# Patient Record
Sex: Female | Born: 1970 | Race: White | Hispanic: No | Marital: Married | Smoking: Former smoker
Health system: Southern US, Community
[De-identification: ages and names within clinical notes are randomized; demographics above are authoritative.]

## PROBLEM LIST (undated history)

## (undated) DIAGNOSIS — Z923 Personal history of irradiation: Secondary | ICD-10-CM

## (undated) DIAGNOSIS — Z8049 Family history of malignant neoplasm of other genital organs: Secondary | ICD-10-CM

## (undated) DIAGNOSIS — N950 Postmenopausal bleeding: Secondary | ICD-10-CM

## (undated) DIAGNOSIS — E785 Hyperlipidemia, unspecified: Secondary | ICD-10-CM

## (undated) DIAGNOSIS — C50919 Malignant neoplasm of unspecified site of unspecified female breast: Secondary | ICD-10-CM

## (undated) DIAGNOSIS — G43909 Migraine, unspecified, not intractable, without status migrainosus: Secondary | ICD-10-CM

## (undated) DIAGNOSIS — Z9889 Other specified postprocedural states: Secondary | ICD-10-CM

## (undated) DIAGNOSIS — Z973 Presence of spectacles and contact lenses: Secondary | ICD-10-CM

## (undated) DIAGNOSIS — R42 Dizziness and giddiness: Secondary | ICD-10-CM

## (undated) DIAGNOSIS — T8859XA Other complications of anesthesia, initial encounter: Secondary | ICD-10-CM

## (undated) HISTORY — DX: Family history of malignant neoplasm of other genital organs: Z80.49

## (undated) HISTORY — DX: Migraine, unspecified, not intractable, without status migrainosus: G43.909

---
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
## 1998-05-20 ENCOUNTER — Other Ambulatory Visit: Admission: RE | Admit: 1998-05-20 | Discharge: 1998-05-20 | Payer: Self-pay | Admitting: Obstetrics & Gynecology

## 1999-01-23 ENCOUNTER — Inpatient Hospital Stay (HOSPITAL_COMMUNITY): Admission: AD | Admit: 1999-01-23 | Discharge: 1999-01-23 | Payer: Self-pay | Admitting: Obstetrics & Gynecology

## 1999-01-25 ENCOUNTER — Inpatient Hospital Stay (HOSPITAL_COMMUNITY): Admission: AD | Admit: 1999-01-25 | Discharge: 1999-01-25 | Payer: Self-pay | Admitting: Obstetrics & Gynecology

## 1999-02-13 ENCOUNTER — Inpatient Hospital Stay (HOSPITAL_COMMUNITY): Admission: AD | Admit: 1999-02-13 | Discharge: 1999-02-14 | Payer: Self-pay | Admitting: Obstetrics & Gynecology

## 1999-12-28 ENCOUNTER — Other Ambulatory Visit: Admission: RE | Admit: 1999-12-28 | Discharge: 1999-12-28 | Payer: Self-pay | Admitting: Obstetrics & Gynecology

## 2002-02-14 ENCOUNTER — Other Ambulatory Visit: Admission: RE | Admit: 2002-02-14 | Discharge: 2002-02-14 | Payer: Self-pay | Admitting: Obstetrics & Gynecology

## 2003-03-13 ENCOUNTER — Other Ambulatory Visit: Admission: RE | Admit: 2003-03-13 | Discharge: 2003-03-13 | Payer: Self-pay | Admitting: Obstetrics & Gynecology

## 2004-04-05 ENCOUNTER — Other Ambulatory Visit: Admission: RE | Admit: 2004-04-05 | Discharge: 2004-04-05 | Payer: Self-pay | Admitting: Obstetrics & Gynecology

## 2005-04-06 ENCOUNTER — Other Ambulatory Visit: Admission: RE | Admit: 2005-04-06 | Discharge: 2005-04-06 | Payer: Self-pay | Admitting: Obstetrics & Gynecology

## 2011-06-21 HISTORY — PX: CHOLECYSTECTOMY, LAPAROSCOPIC: SHX56

## 2013-06-20 HISTORY — PX: INCISIONAL HERNIA REPAIR: SHX193

## 2015-03-26 DIAGNOSIS — R51 Headache: Secondary | ICD-10-CM

## 2015-03-26 DIAGNOSIS — R519 Headache, unspecified: Secondary | ICD-10-CM | POA: Insufficient documentation

## 2015-03-26 DIAGNOSIS — K219 Gastro-esophageal reflux disease without esophagitis: Secondary | ICD-10-CM | POA: Insufficient documentation

## 2015-03-26 DIAGNOSIS — J309 Allergic rhinitis, unspecified: Secondary | ICD-10-CM | POA: Insufficient documentation

## 2018-11-19 DIAGNOSIS — Z170 Estrogen receptor positive status [ER+]: Secondary | ICD-10-CM

## 2018-11-19 DIAGNOSIS — C50211 Malignant neoplasm of upper-inner quadrant of right female breast: Secondary | ICD-10-CM

## 2018-11-19 HISTORY — DX: Estrogen receptor positive status (ER+): Z17.0

## 2018-11-19 HISTORY — PX: BREAST BIOPSY: SHX20

## 2018-11-19 HISTORY — DX: Malignant neoplasm of upper-inner quadrant of right female breast: C50.211

## 2018-12-05 ENCOUNTER — Other Ambulatory Visit: Payer: Self-pay | Admitting: Obstetrics & Gynecology

## 2018-12-05 DIAGNOSIS — R928 Other abnormal and inconclusive findings on diagnostic imaging of breast: Secondary | ICD-10-CM

## 2018-12-06 ENCOUNTER — Ambulatory Visit
Admission: RE | Admit: 2018-12-06 | Discharge: 2018-12-06 | Disposition: A | Discharge status: Home / Self Care | Payer: BC Managed Care – PPO | Source: Ambulatory Visit | Attending: Obstetrics & Gynecology | Admitting: Obstetrics & Gynecology

## 2018-12-06 ENCOUNTER — Other Ambulatory Visit: Payer: Self-pay | Admitting: Obstetrics & Gynecology

## 2018-12-06 ENCOUNTER — Other Ambulatory Visit: Payer: Self-pay

## 2018-12-06 DIAGNOSIS — N644 Mastodynia: Secondary | ICD-10-CM

## 2018-12-06 DIAGNOSIS — R928 Other abnormal and inconclusive findings on diagnostic imaging of breast: Secondary | ICD-10-CM

## 2018-12-06 DIAGNOSIS — N6310 Unspecified lump in the right breast, unspecified quadrant: Secondary | ICD-10-CM

## 2018-12-06 IMAGING — US ULTRASOUND RIGHT BREAST LIMITED
1 series · 13 of 13 positions shown · non-contrast
Comparison: Previous exam(s).

CLINICAL DATA: Patient was called back from screening mammogram for
possible right breast distortion.

EXAM:
DIGITAL DIAGNOSTIC RIGHT MAMMOGRAM WITH CAD AND TOMO
ULTRASOUND RIGHT BREAST

[Series 1: ultrasound right breast limited · 0.06mm/px · 13 of 13 slices shown]
[im 1/13]
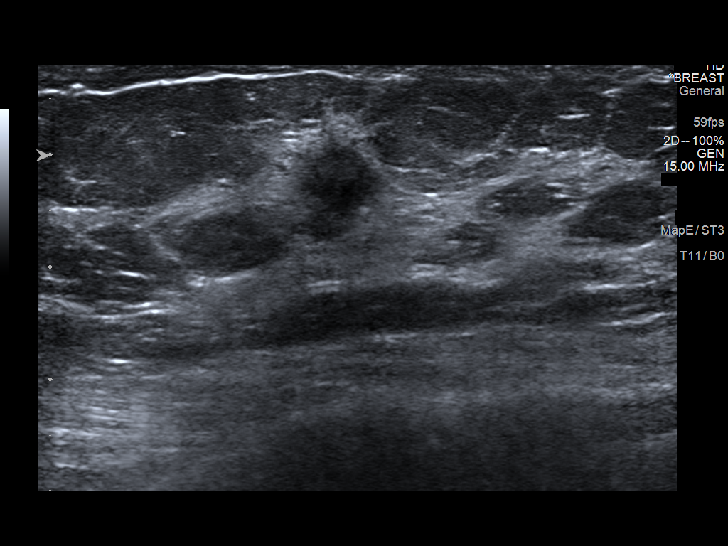
[im 2/13]
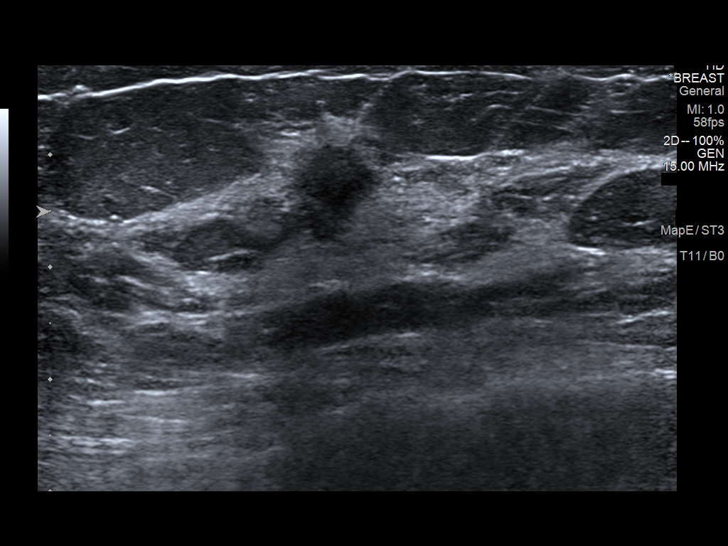
[im 3/13]
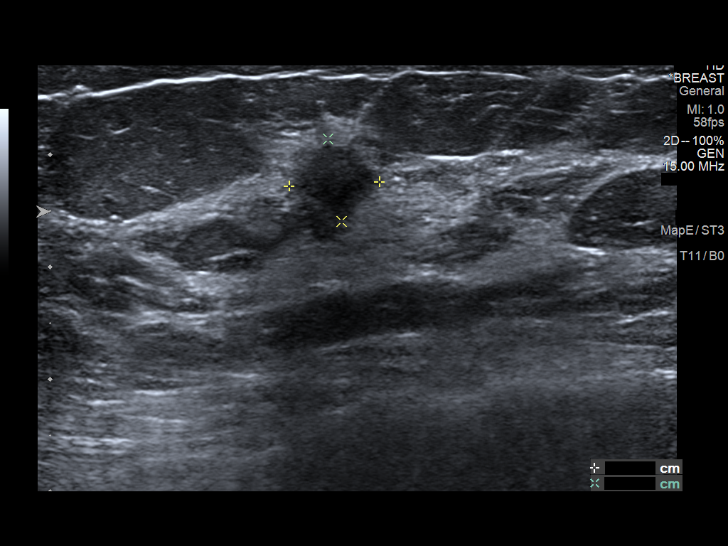
[im 4/13]
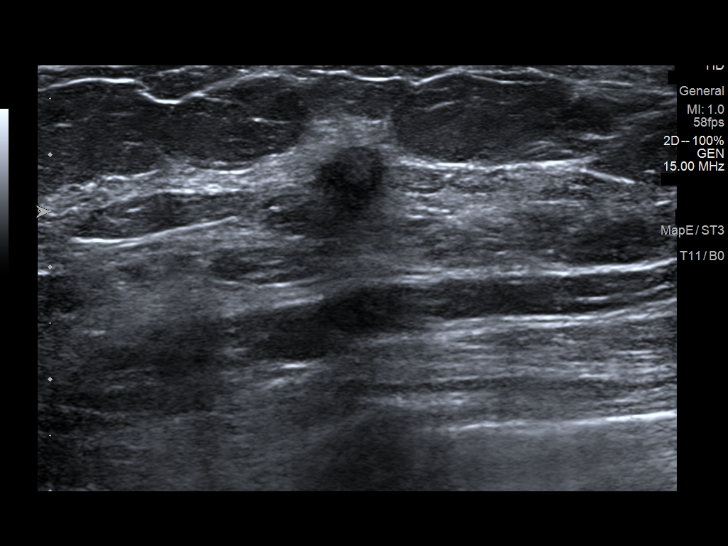
[im 5/13]
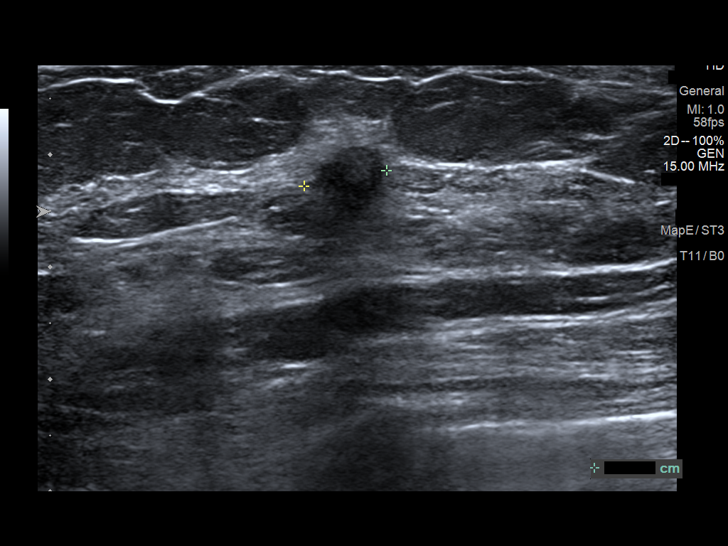
[im 6/13]
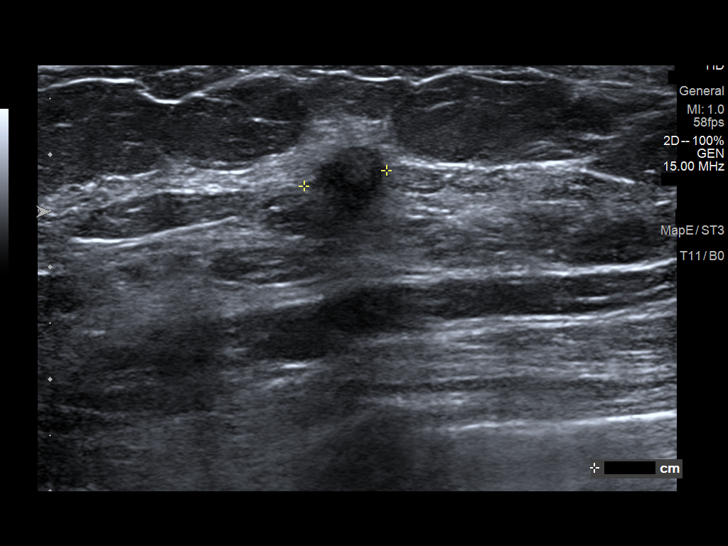
[im 7/13]
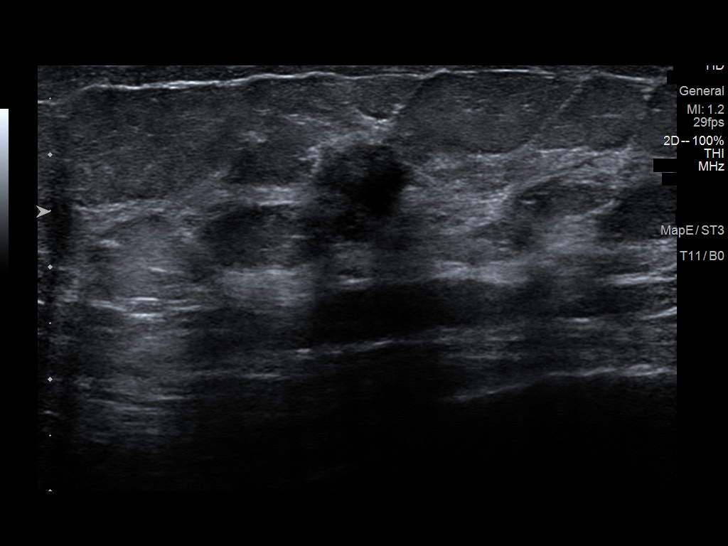
[im 8/13]
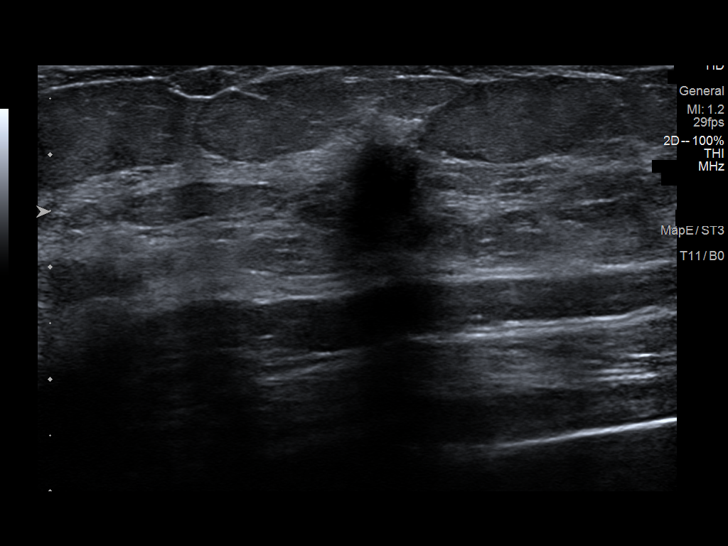
[im 9/13]
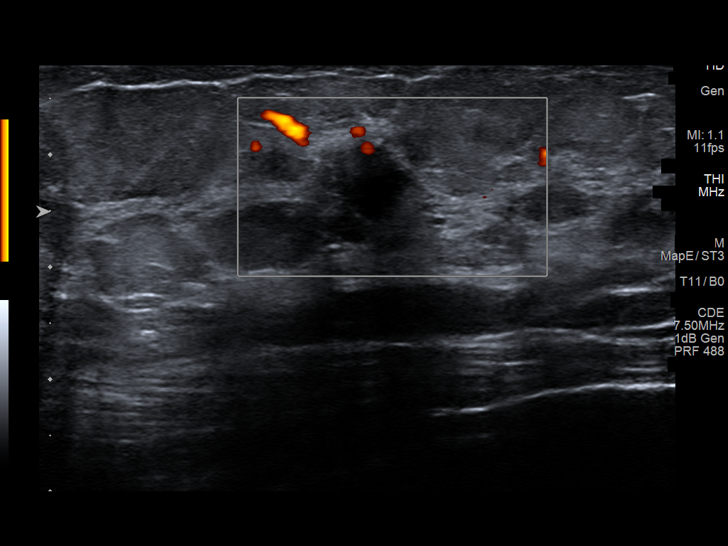
[im 10/13]
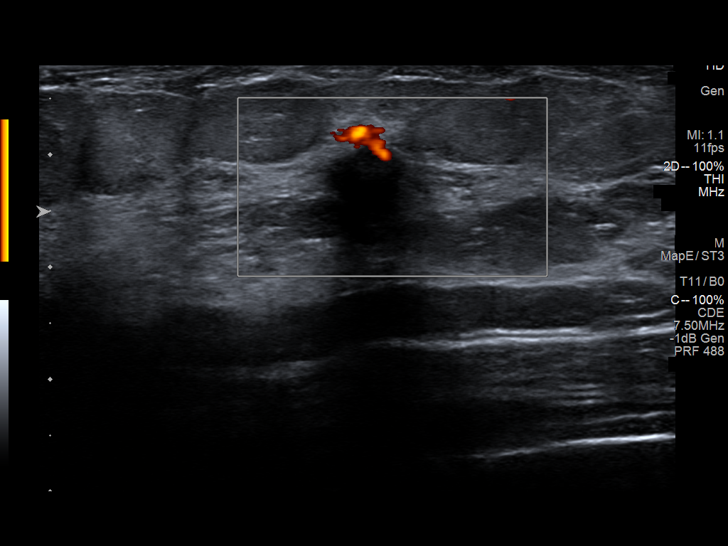
[im 11/13]
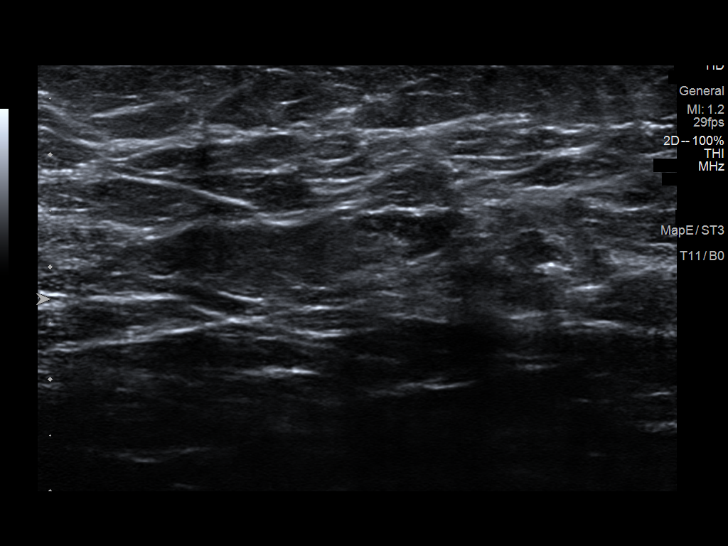
[im 12/13]
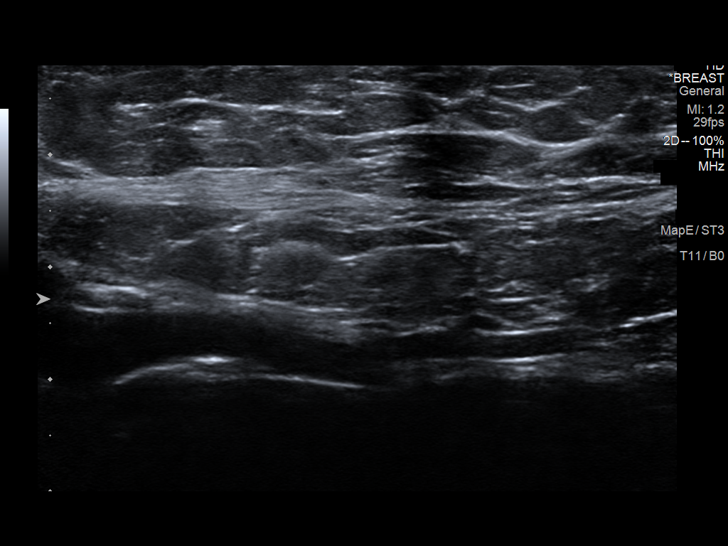
[im 13/13]
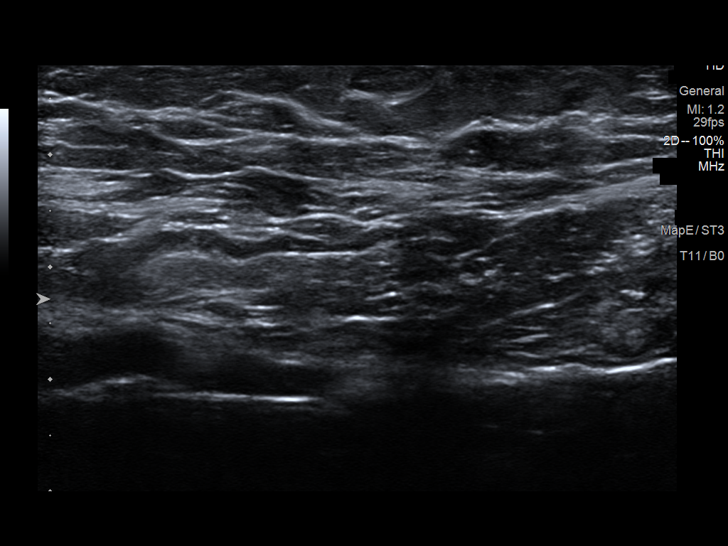

[13 of 13 positions shown; findings below may reference images not displayed]

ACR Breast Density Category c: The breast tissue is heterogeneously
dense, which may obscure small masses.
FINDINGS: Additional imaging of the right breast was performed. There is
persistent distortion in the upper-inner quadrant of the breast.
There are no malignant type microcalcifications.

Mammographic images were processed with CAD.

On physical exam, no mass is palpated in the upper-inner quadrant of
the right breast.

Targeted ultrasound is performed, showing an irregular hypoechoic
mass in the right breast at 1 o'clock 2 cm from the nipple measuring
8 x 7 x 8 mm. Sonographic evaluation of the right axilla does not
show any enlarged adenopathy.
IMPRESSION: Suspicious mass and distortion in the 1 o'clock region of the right
breast.

RECOMMENDATION:
Ultrasound-guided core biopsy of the 1 o'clock region of the right
breast is recommended. The biopsy will be scheduled the patient's
convenience.

I have discussed the findings and recommendations with the patient.
Results were also provided in writing at the conclusion of the
visit. If applicable, a reminder letter will be sent to the patient
regarding the next appointment.

BI-RADS CATEGORY  4: Suspicious.

## 2018-12-06 IMAGING — MG DIGITAL DIAGNOSTIC UNILATERAL RIGHT MAMMOGRAM WITH TOMO AND CAD
6 series · 6 of 18 positions shown · non-contrast
Comparison: Previous exam(s).

CLINICAL DATA: Patient was called back from screening mammogram for
possible right breast distortion.

EXAM:
DIGITAL DIAGNOSTIC RIGHT MAMMOGRAM WITH CAD AND TOMO
ULTRASOUND RIGHT BREAST

[R ML synth-2D]
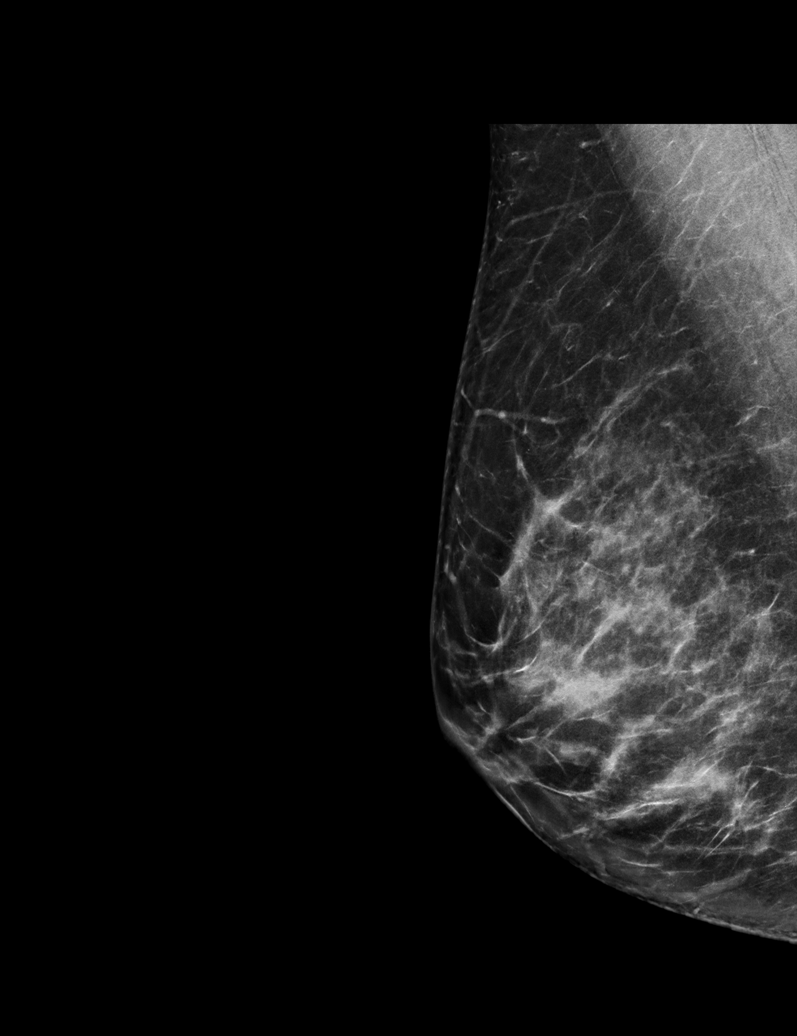

[R MLO synth-2D]
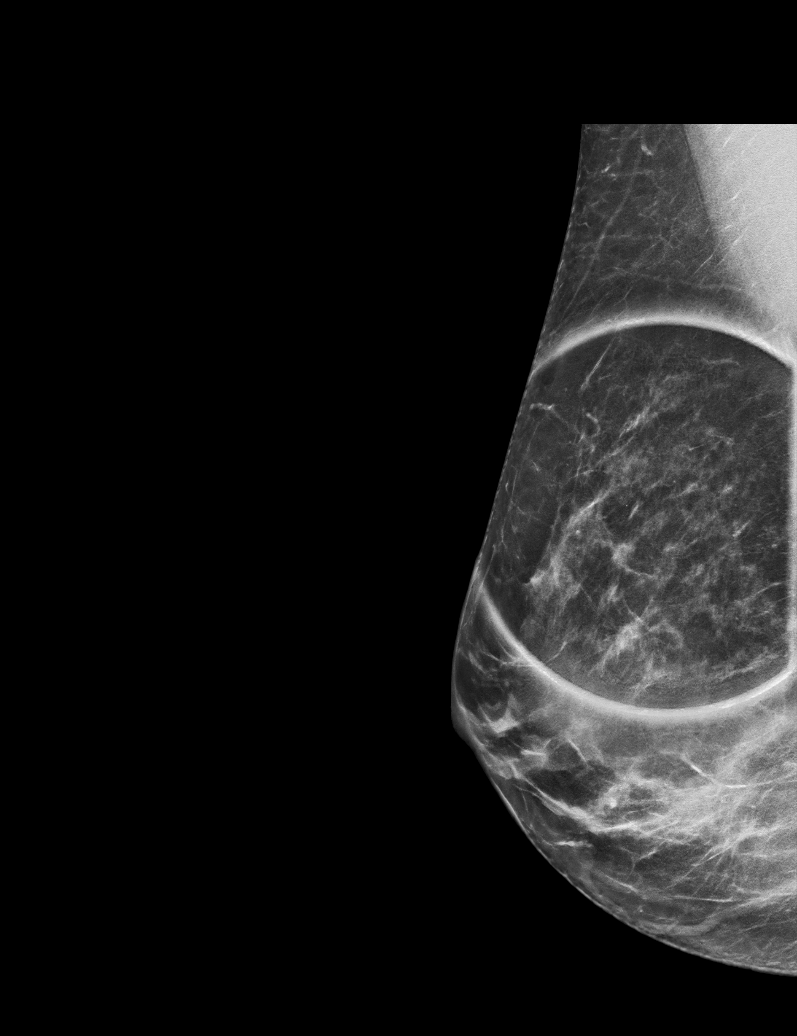

[R CC synth-2D]
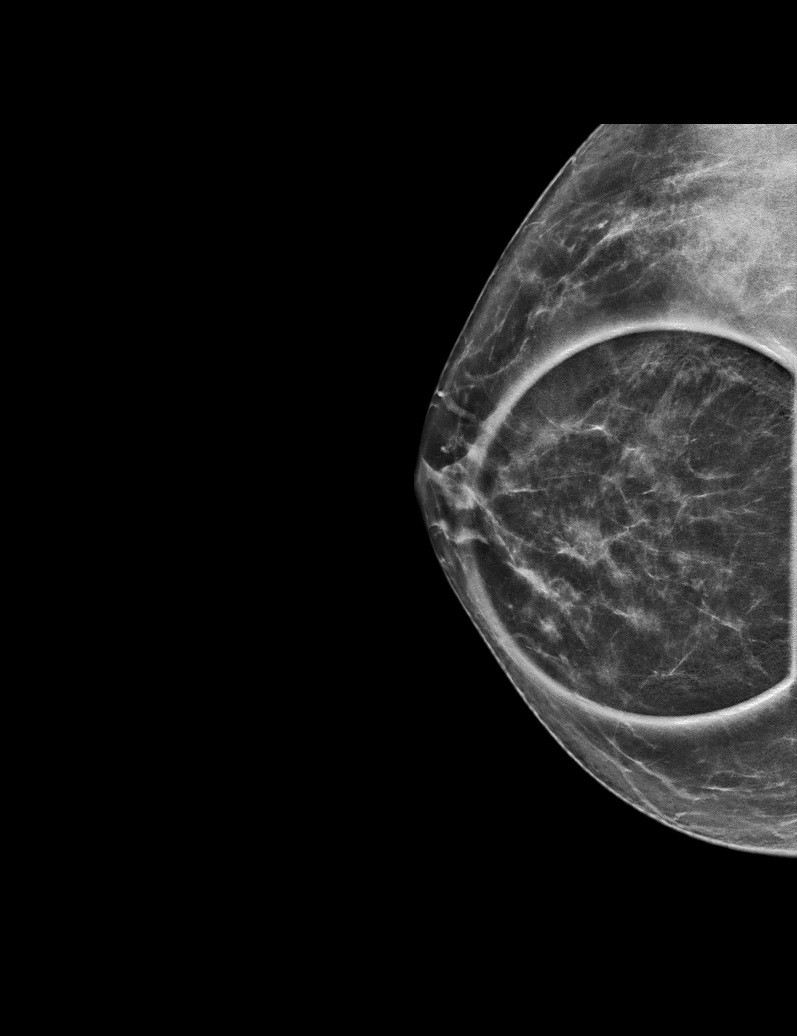

[R CC tomo · tomo slice 35/68.0]
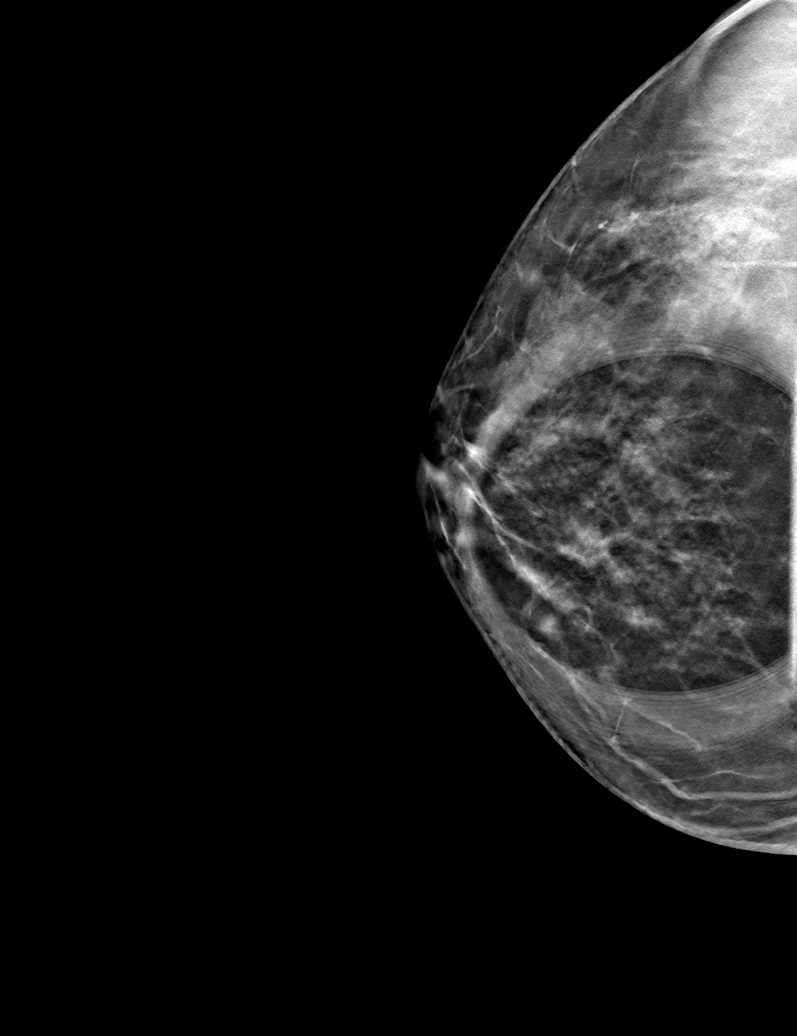

[R MLO tomo · tomo slice 32/63.0]
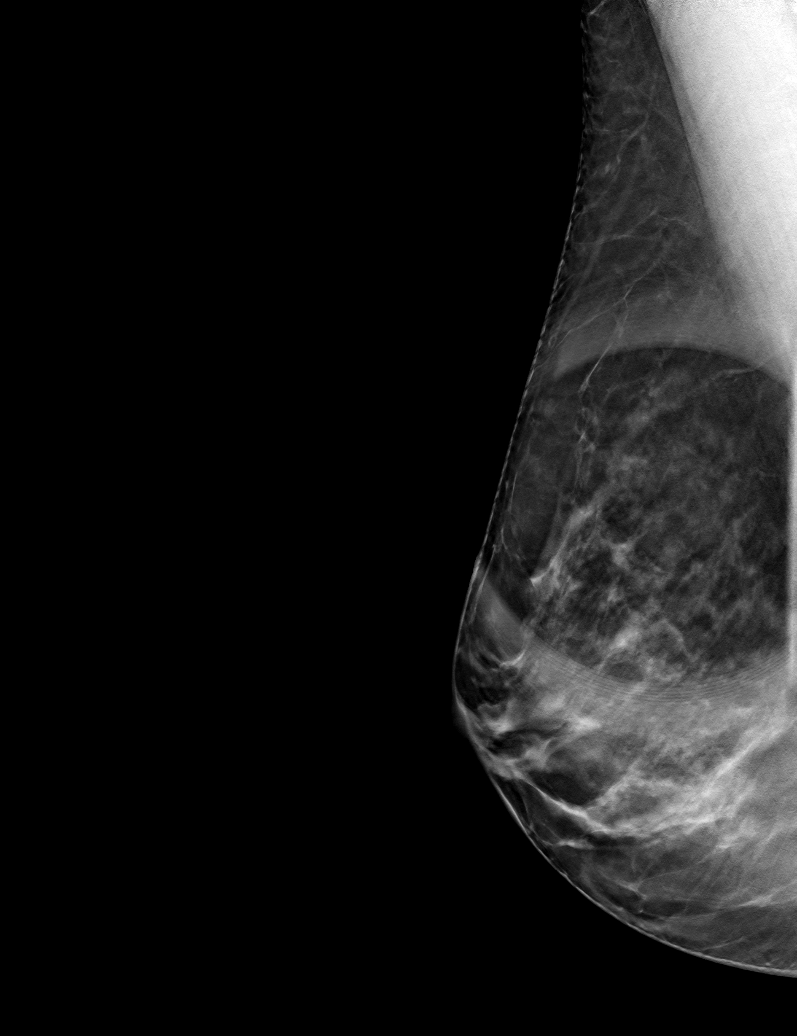

[R ML tomo · tomo slice 37/72.0]
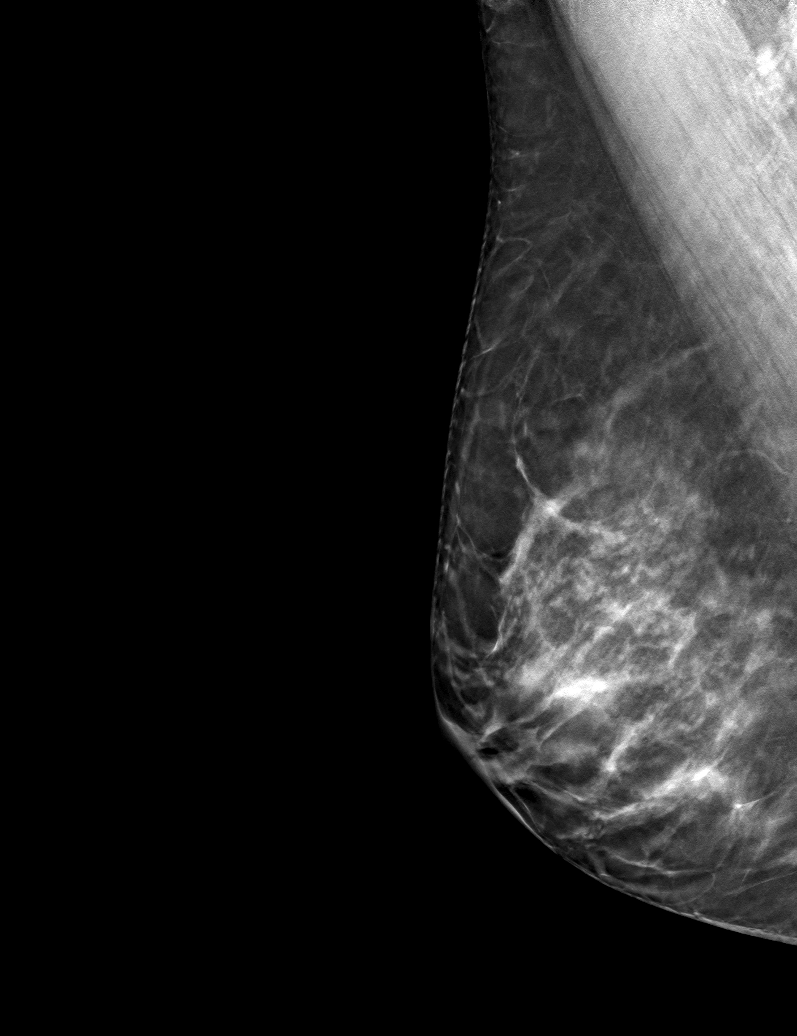

[6 of 18 positions shown; findings below may reference images not displayed]

ACR Breast Density Category c: The breast tissue is heterogeneously
dense, which may obscure small masses.
FINDINGS: Additional imaging of the right breast was performed. There is
persistent distortion in the upper-inner quadrant of the breast.
There are no malignant type microcalcifications.

Mammographic images were processed with CAD.

On physical exam, no mass is palpated in the upper-inner quadrant of
the right breast.

Targeted ultrasound is performed, showing an irregular hypoechoic
mass in the right breast at 1 o'clock 2 cm from the nipple measuring
8 x 7 x 8 mm. Sonographic evaluation of the right axilla does not
show any enlarged adenopathy.
IMPRESSION: Suspicious mass and distortion in the 1 o'clock region of the right
breast.

RECOMMENDATION:
Ultrasound-guided core biopsy of the 1 o'clock region of the right
breast is recommended. The biopsy will be scheduled the patient's
convenience.

I have discussed the findings and recommendations with the patient.
Results were also provided in writing at the conclusion of the
visit. If applicable, a reminder letter will be sent to the patient
regarding the next appointment.

BI-RADS CATEGORY  4: Suspicious.

## 2018-12-10 ENCOUNTER — Ambulatory Visit
Admission: RE | Admit: 2018-12-10 | Discharge: 2018-12-10 | Disposition: A | Discharge status: Home / Self Care | Payer: BC Managed Care – PPO | Source: Ambulatory Visit | Attending: Obstetrics & Gynecology | Admitting: Obstetrics & Gynecology

## 2018-12-10 ENCOUNTER — Other Ambulatory Visit: Payer: Self-pay | Admitting: Obstetrics & Gynecology

## 2018-12-10 ENCOUNTER — Other Ambulatory Visit: Payer: Self-pay

## 2018-12-10 DIAGNOSIS — N6310 Unspecified lump in the right breast, unspecified quadrant: Secondary | ICD-10-CM

## 2018-12-10 IMAGING — MG MM CLIP PLACEMENT
4 series · 4 of 12 positions shown · non-contrast
Comparison: Previous exam(s).

CLINICAL DATA: Status post ultrasound-guided core needle biopsy of
a right breast mass.

EXAM:
DIAGNOSTIC RIGHT MAMMOGRAM POST ULTRASOUND BIOPSY

[R ML synth-2D]
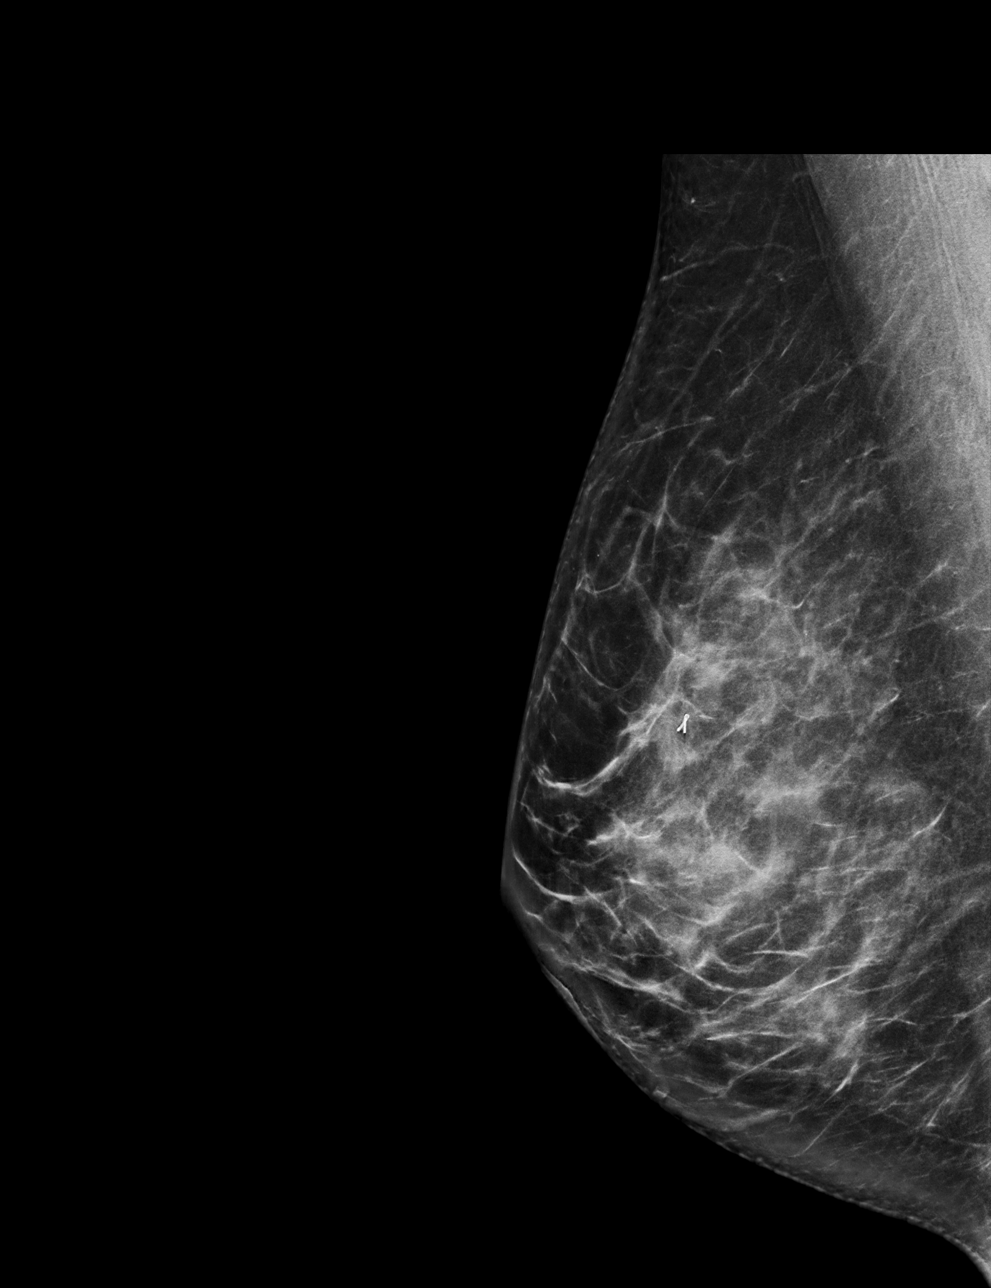

[R CC synth-2D]
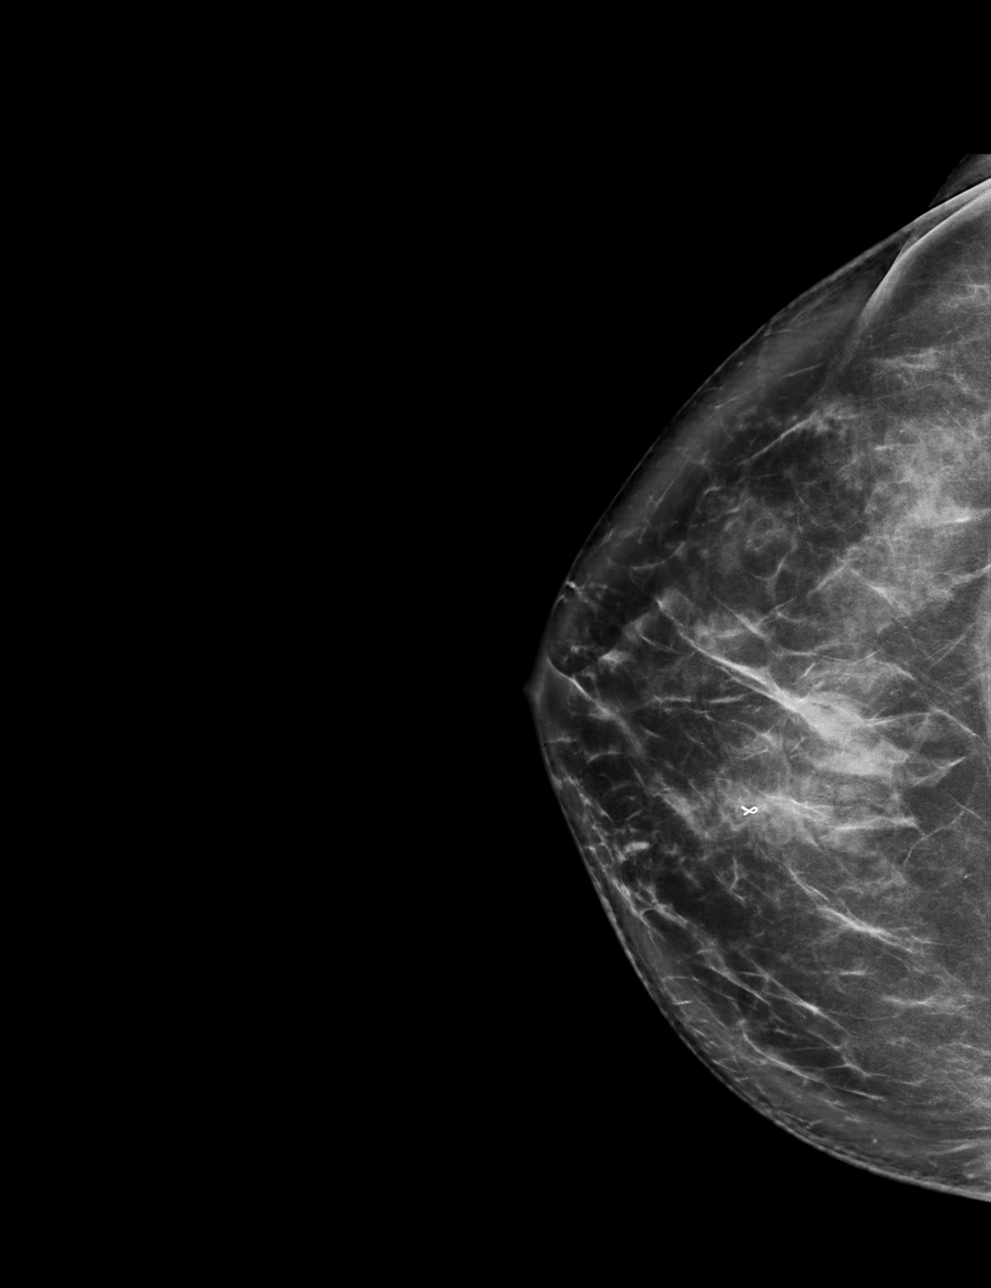

[R ML tomo · tomo slice 41/81.0]
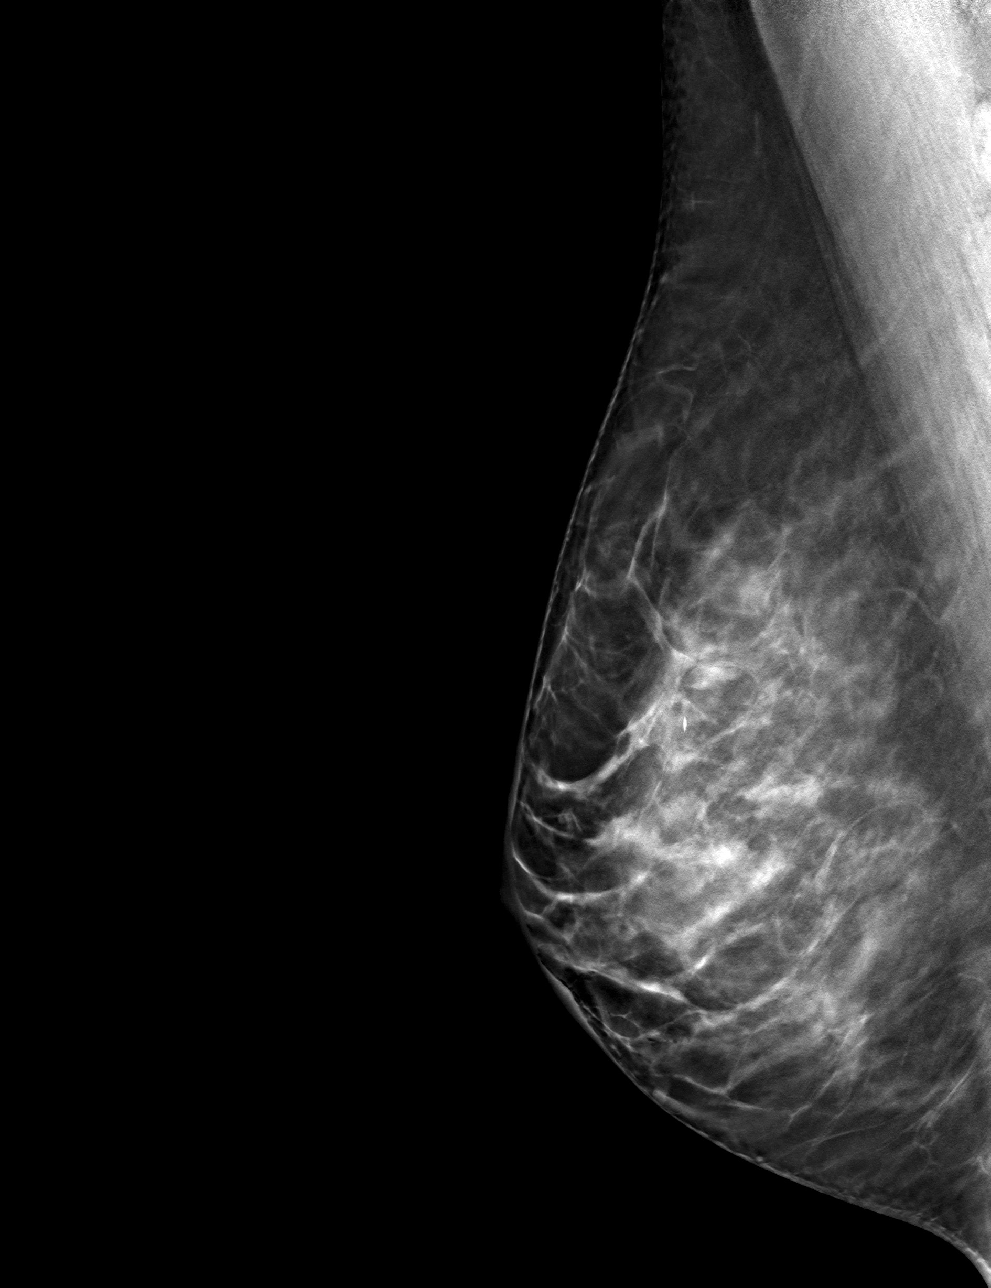

[R CC tomo · tomo slice 45/90.0]
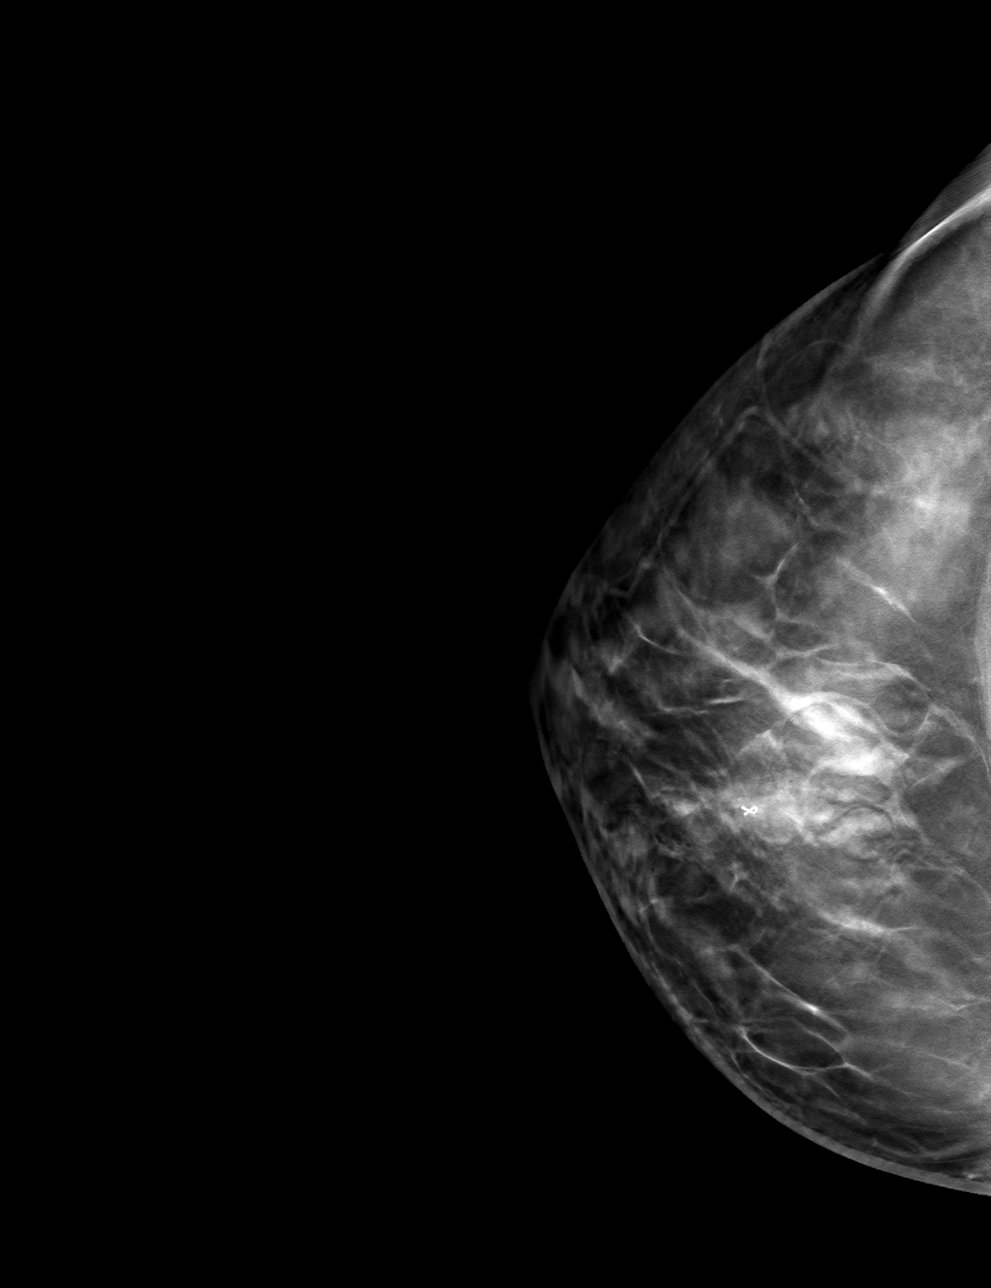

[4 of 12 positions shown; findings below may reference images not displayed]

FINDINGS: Mammographic images were obtained following ultrasound guided biopsy
of a right breast mass. The ribbon shaped biopsy clip lies within
the mass.
IMPRESSION: Well-positioned ribbon shaped biopsy clip following
ultrasound-guided core needle biopsy of the right breast.

Final Assessment: Post Procedure Mammograms for Marker Placement

## 2018-12-10 IMAGING — US US BREAST BX W LOC DEV 1ST LESION IMG BX SPEC US GUIDE*R*
1 series · 11 of 11 positions shown · non-contrast
Comparison: Previous exam(s).
COMPARISON: Previous exam(s).

Addendum:
CLINICAL DATA: Patient presents for ultrasound-guided core needle
biopsy of an 8 mm mass in the right breast 1 o'clock, 2 cm from the
nipple.

EXAM:
ULTRASOUND GUIDED RIGHT BREAST CORE NEEDLE BIOPSY

[Series 1: us breast bx w loc dev 1st lesion img bx spec us g · 0.06mm/px · 11 of 11 slices shown]
[im 1/11]
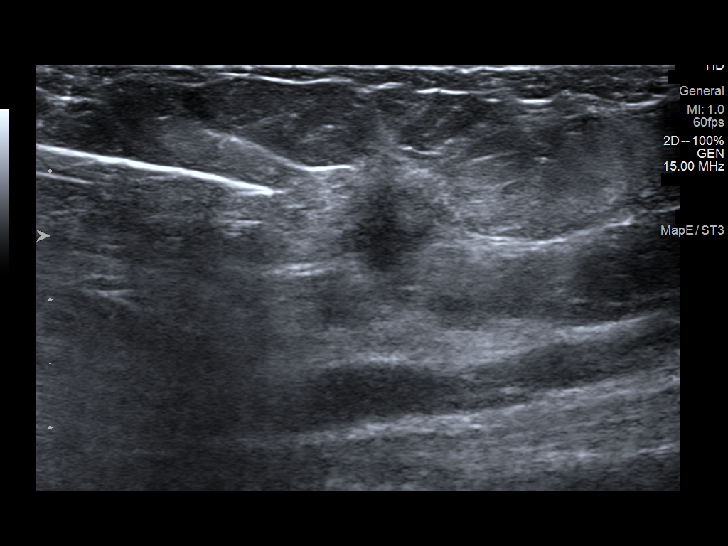
[im 2/11]
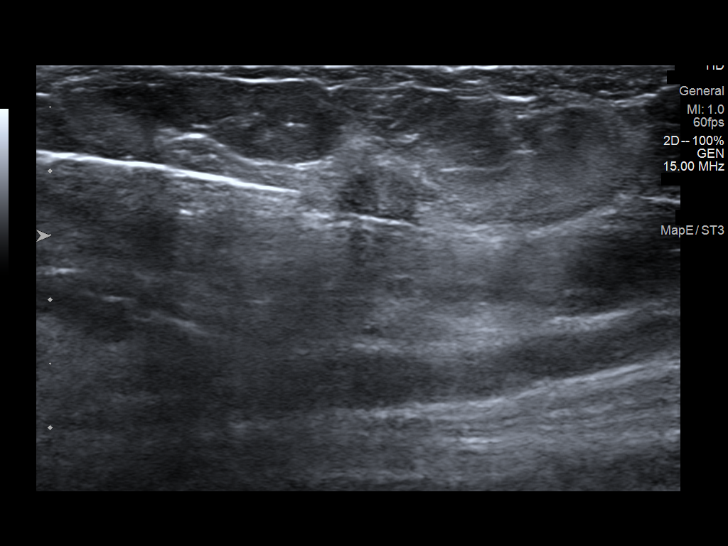
[im 3/11]
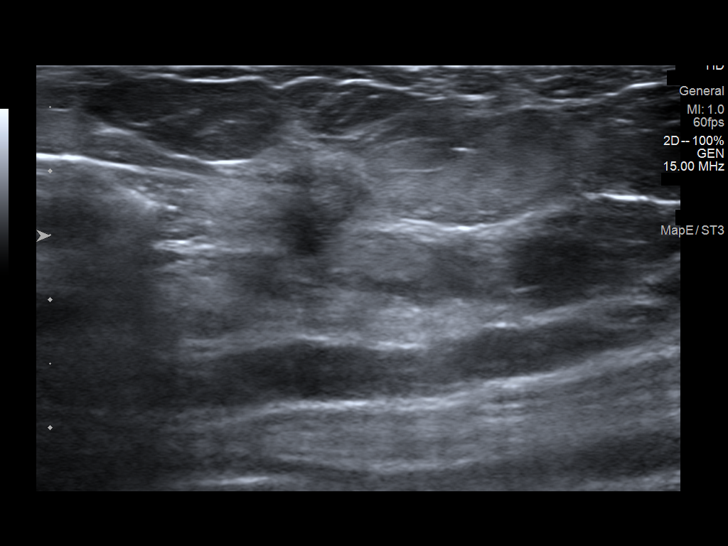
[im 4/11]
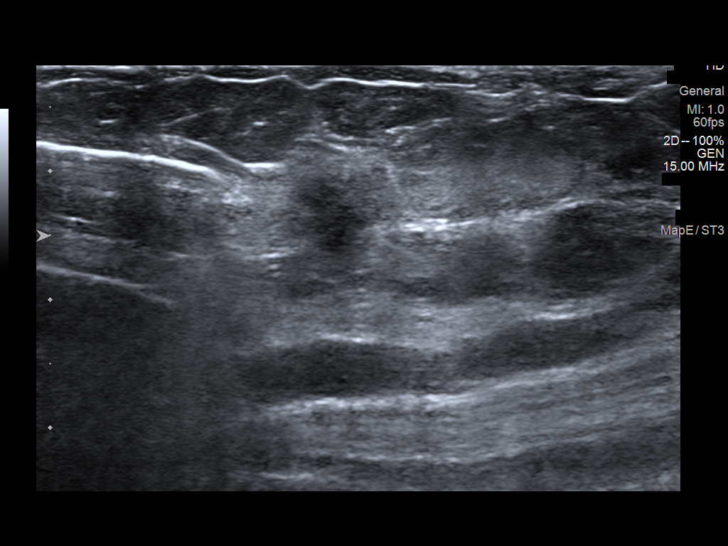
[im 5/11]
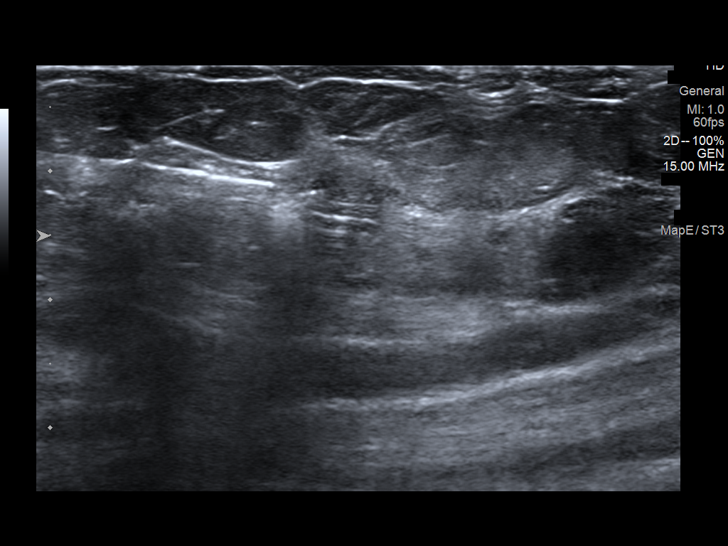
[im 6/11]
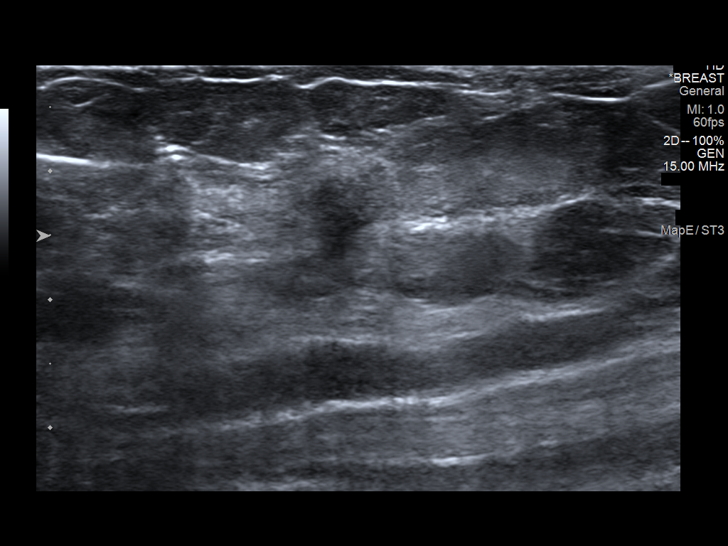
[im 7/11]
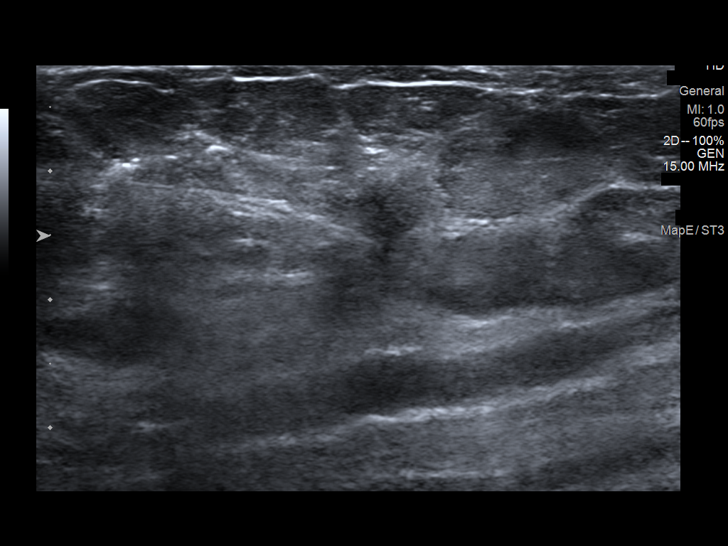
[im 8/11]
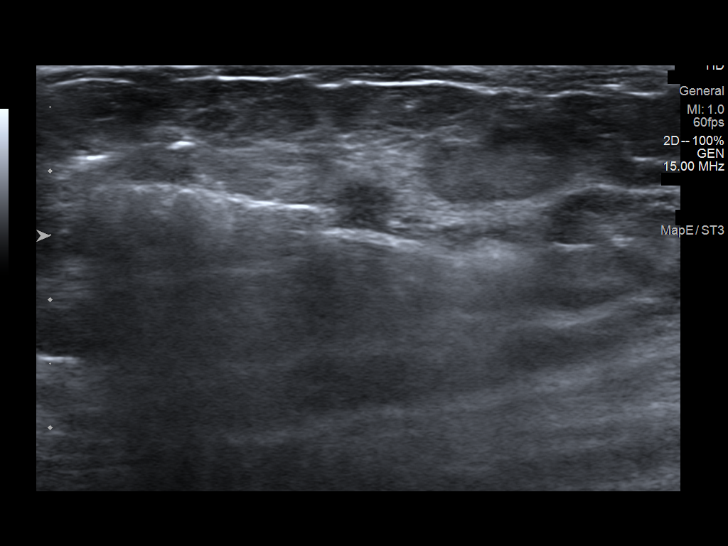
[im 9/11]
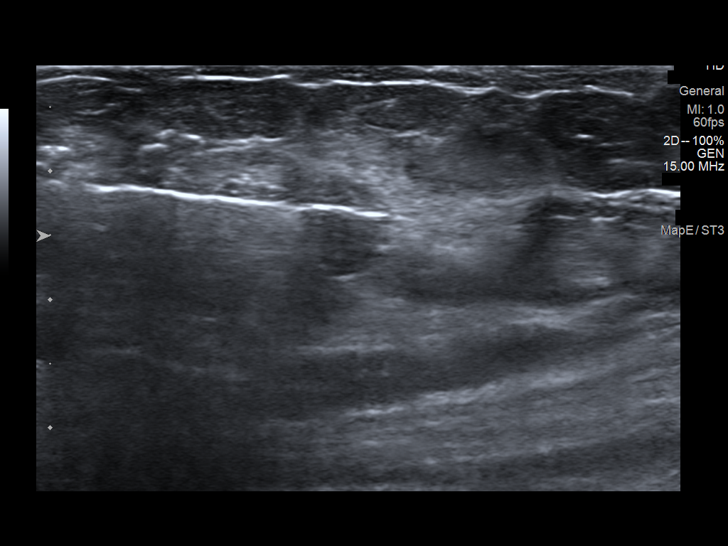
[im 10/11]
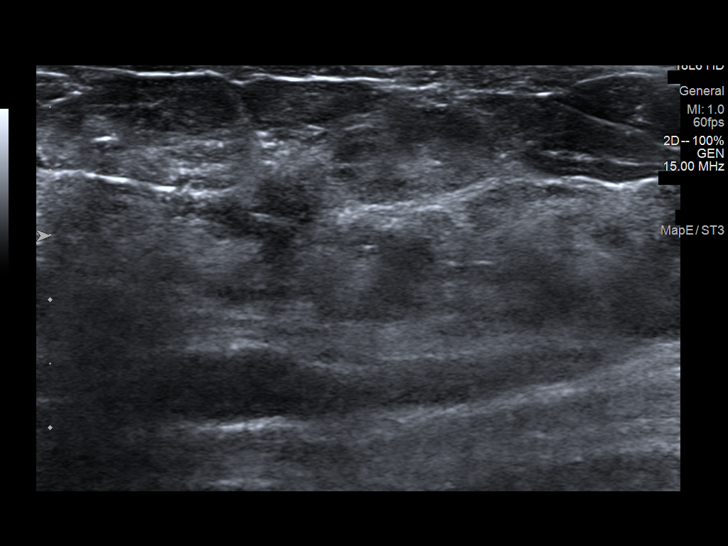
[im 11/11]
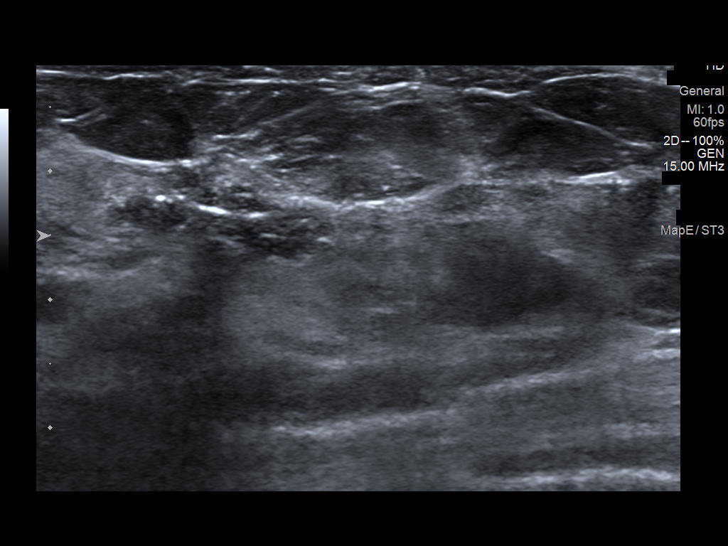

[11 of 11 positions shown; findings below may reference images not displayed]



Lesion quadrant: Upper inner quadrant

Using sterile technique and 1% Lidocaine as local anesthetic, under
direct ultrasound visualization, a 12 gauge SAMUEL KS device was
used to perform biopsy of the small right breast mass using an
inferior approach. At the conclusion of the procedure a ribbon
shaped tissue marker clip was deployed into the biopsy cavity.
Follow up 2 view mammogram was performed and dictated separately.
IMPRESSION: Ultrasound guided biopsy of a right breast mass. No apparent
complications.

ADDENDUM:
Pathology revealed GRADE I INVASIVE DUCTAL CARCINOMA, DUCTAL
CARCINOMA IN SITU of the RIGHT breast, upper inner quadrant, 1
o'clock, 2 cm from nipple. This was found to be concordant by Dr.
SAMUEL KS.

Pathology results were discussed with the patient by Dr. SAMUEL KS
SAMUEL KS. The patient reported doing well after the biopsy with
tenderness at the site. Post biopsy instructions and care were
reviewed and questions were answered. The patient was encouraged to
call The [REDACTED] for any additional
concerns.

The patient was referred to [REDACTED]
[REDACTED] at [REDACTED] on
[DATE].

Pathology results reported by SAMUEL KS, RN on [DATE].



Lesion quadrant: Upper inner quadrant

Using sterile technique and 1% Lidocaine as local anesthetic, under
direct ultrasound visualization, a 12 gauge SAMUEL KS device was
used to perform biopsy of the small right breast mass using an
inferior approach. At the conclusion of the procedure a ribbon
shaped tissue marker clip was deployed into the biopsy cavity.
Follow up 2 view mammogram was performed and dictated separately.
IMPRESSION: Ultrasound guided biopsy of a right breast mass. No apparent
complications.

## 2018-12-12 ENCOUNTER — Encounter: Payer: Self-pay | Admitting: *Deleted

## 2018-12-12 ENCOUNTER — Other Ambulatory Visit: Payer: Self-pay

## 2018-12-12 DIAGNOSIS — Z170 Estrogen receptor positive status [ER+]: Secondary | ICD-10-CM

## 2018-12-12 DIAGNOSIS — C50211 Malignant neoplasm of upper-inner quadrant of right female breast: Secondary | ICD-10-CM | POA: Insufficient documentation

## 2018-12-13 ENCOUNTER — Other Ambulatory Visit: Payer: Self-pay | Admitting: *Deleted

## 2018-12-13 ENCOUNTER — Telehealth: Payer: Self-pay | Admitting: Hematology and Oncology

## 2018-12-13 DIAGNOSIS — Z170 Estrogen receptor positive status [ER+]: Secondary | ICD-10-CM

## 2018-12-13 DIAGNOSIS — C50211 Malignant neoplasm of upper-inner quadrant of right female breast: Secondary | ICD-10-CM

## 2018-12-13 NOTE — Telephone Encounter (Signed)
Spoke to patient to confirm afternoon [facility] appointment for 7/1, packet emailed to patient

## 2018-12-18 NOTE — Progress Notes (Signed)
[place] CONSULT NOTE  Patient Care Team: Spry, Marsh Dolly., MD as PCP - General (Family Medicine) Rockwell Germany, RN as Oncology Nurse Navigator Mauro Kaufmann, RN as Oncology Nurse Navigator Jovita Kussmaul, MD as Consulting Physician (General Surgery) Nicholas Lose, MD as Consulting Physician (Hematology and Oncology) Eppie Gibson, MD as Attending Physician (Radiation Oncology)  CHIEF COMPLAINTS/PURPOSE OF CONSULTATION:  Newly diagnosed breast cancer  HISTORY OF PRESENTING ILLNESS:  Debbie Berry 48 y.o. female is here because of recent diagnosis of invasive ductal carcinoma of the right breast. The cancer was detected on a routine screening mammogram on 12/05/18. Diagnostic mammogram and [place] on 12/06/18 showed a mass in the right breast at the 1 o'clock position measuring 30m and no adenopathy in the right axilla. Breast biopsy on 12/10/18 showed grade 1 invasive ductal carcinoma with DCIS, HER-2 negative by FISH, ER 90%, PR 90%, Ki67 10%. She presents to the clinic today for initial evaluation and discussion of treatment options.   I reviewed her records extensively and collaborated the history with the patient.  SUMMARY OF ONCOLOGIC HISTORY: Oncology History  Malignant neoplasm of upper-inner quadrant of right breast in female, estrogen receptor positive ([place]  12/12/2018 Initial Diagnosis   Screening mammogram detected a distortion in the right breast, measuring 842mon diagnostic [place] confirmed grade 1 IDC with DCIS, HER-2 negative by FISH, ER 90%, PR 90%, Ki67 10%.    12/19/2018 Cancer Staging   Staging form: Breast, AJCC 8th Edition - Clinical stage from 12/19/2018: Stage IA (cT1b, cN0, cM0, G1, ER+, PR+, HER2-) - Signed by GuNicholas LoseMD on 12/19/2018     MEDICAL HISTORY:  Past Medical History:  Diagnosis Date  . Migraine     SURGICAL HISTORY: Past Surgical History:  Procedure Laterality Date  . CHOLECYSTECTOMY    . HERNIA REPAIR      SOCIAL HISTORY: Social History   Socioeconomic History  . Marital status: Married    Spouse name: Not on file  . Number of children: Not on file  . Years of education: Not on file  . Highest education level: Not on file  Occupational History  . Not on file  Social Needs  . Financial resource strain: Not on file  . Food insecurity    Worry: Not on file    Inability: Not on file  . Transportation needs    Medical: Not on file    Non-medical: Not on file  Tobacco Use  . Smoking status: Former Smoker    Types: Cigarettes  . Smokeless tobacco: Never Used  Substance and Sexual Activity  . Alcohol use: Yes  . Drug use: Never  . Sexual activity: Not on file  Lifestyle  . Physical activity    Days per week: Not on file    Minutes per session: Not on file  . Stress: Not on file  Relationships  . Social coHerbalistn phone: Not on file    Gets together: Not on file    Attends religious service: Not on file    Active member of club or organization: Not on file    Attends meetings of clubs or organizations: Not on file    Relationship status: Not on file  . Intimate partner violence    Fear of current or ex partner: Not on file    Emotionally abused: Not on file    Physically abused: Not on file    Forced sexual  activity: Not on file  Other Topics Concern  . Not on file  Social History Narrative  . Not on file    FAMILY HISTORY: Family History  Problem Relation Age of Onset  . Cervical cancer Paternal Grandmother   . Cervical cancer Paternal Aunt     ALLERGIES:  is allergic to penicillins.  MEDICATIONS:  Current Outpatient Medications  Medication Sig Dispense Refill  . cetirizine (ZYRTEC) 10 MG tablet Take 10 mg by mouth daily as needed for allergies.    Marland Kitchen propranolol (INDERAL) 60 MG tablet Take 60 mg by mouth daily.    Marland Kitchen triamcinolone (NASACORT) 55 MCG/ACT AERO nasal inhaler Place 2 sprays into the nose 3 (three) times a week.     No current  facility-administered medications for this visit.     REVIEW OF SYSTEMS:   Constitutional: Denies fevers, chills or abnormal night sweats Eyes: Denies blurriness of vision, double vision or watery eyes Ears, nose, mouth, throat, and face: Denies mucositis or sore throat Respiratory: Denies cough, dyspnea or wheezes Cardiovascular: Denies palpitation, chest discomfort or lower extremity swelling Gastrointestinal:  Denies nausea, heartburn or change in bowel habits Skin: Denies abnormal skin rashes Lymphatics: Denies new lymphadenopathy or easy bruising Neurological:Denies numbness, tingling or new weaknesses Behavioral/Psych: Mood is stable, no new changes  Breast: Denies any palpable lumps or discharge All other systems were reviewed with the patient and are negative.  PHYSICAL EXAMINATION: ECOG PERFORMANCE STATUS: 0 - Asymptomatic  Vitals:   12/19/18 1315  BP: 128/86  Pulse: 84  Resp: 18  Temp: 98.7 F (37.1 C)  SpO2: 100%   Filed Weights   12/19/18 1315  Weight: 146 lb 14.4 oz (66.6 kg)    GENERAL:alert, no distress and comfortable SKIN: skin color, texture, turgor are normal, no rashes or significant lesions EYES: normal, conjunctiva are pink and non-injected, sclera clear OROPHARYNX:no exudate, no erythema and lips, buccal mucosa, and tongue normal  NECK: supple, thyroid normal size, non-tender, without nodularity LYMPH:  no palpable lymphadenopathy in the cervical, axillary or inguinal LUNGS: clear to auscultation and percussion with normal breathing effort HEART: regular rate & rhythm and no murmurs and no lower extremity edema ABDOMEN:abdomen soft, non-tender and normal bowel sounds Musculoskeletal:no cyanosis of digits and no clubbing  PSYCH: alert & oriented x 3 with fluent speech NEURO: no focal motor/sensory deficits BREAST: No palpable nodules in breast. No palpable axillary or supraclavicular lymphadenopathy (exam performed in the presence of a chaperone)    LABORATORY DATA:  I have reviewed the data as listed Lab Results  Component Value Date   WBC 6.4 12/19/2018   HGB 14.6 12/19/2018   HCT 44.8 12/19/2018   MCV 88.2 12/19/2018   PLT 256 12/19/2018   Lab Results  Component Value Date   NA 139 12/19/2018   K 3.6 12/19/2018   CL 106 12/19/2018   CO2 23 12/19/2018    RADIOGRAPHIC STUDIES: I have personally reviewed the radiological reports and agreed with the findings in the report.  ASSESSMENT AND PLAN:  Malignant neoplasm of upper-inner quadrant of right breast in female, estrogen receptor positive ([place]) 12/12/2018:Screening mammogram detected a distortion in the right breast, measuring 90m on diagnostic mammogram and [place] Biopsy confirmed grade 1 IDC with DCIS, HER-2 negative by FISH, ER 90%, PR 90%, Ki67 10%.  Pathology and radiology counseling: Discussed with the patient, the details of pathology including the type of breast cancer,the clinical staging, the significance of ER, PR and HER-2/neu receptors and the implications for  treatment. After reviewing the pathology in detail, we proceeded to discuss the different treatment options between surgery, radiation, chemotherapy, antiestrogen therapies.  Treatment plan: 1.  Breast conserving surgery with sentinel lymph node biopsy 2.  Adjuvant radiation therapy 3.  Adjuvant antiestrogen therapy  I discussed with the patient that she does not need chemotherapy based upon the initial radiology findings.  If the final tumor is greater than 10 mm then we will consider sending for Oncotype DX.  Patient works as an Optometrist. I will discuss with her after surgery through a [employer] video visit.   All questions were answered. The patient knows to call the clinic with any problems, questions or concerns.   Rulon Eisenmenger, MD 12/19/2018    I, Molly Dorshimer, am acting as scribe for Nicholas Lose, MD.  I have reviewed the above documentation for accuracy and completeness, and I agree with  the above.

## 2018-12-19 ENCOUNTER — Encounter: Payer: Self-pay | Admitting: Physical Therapy

## 2018-12-19 ENCOUNTER — Other Ambulatory Visit: Payer: Self-pay

## 2018-12-19 ENCOUNTER — Encounter: Payer: Self-pay | Admitting: Hematology and Oncology

## 2018-12-19 ENCOUNTER — Inpatient Hospital Stay: Payer: BC Managed Care – PPO

## 2018-12-19 ENCOUNTER — Ambulatory Visit: Payer: BC Managed Care – PPO | Attending: General Surgery | Admitting: Physical Therapy

## 2018-12-19 ENCOUNTER — Inpatient Hospital Stay: Payer: BC Managed Care – PPO | Attending: Hematology and Oncology | Admitting: Hematology and Oncology

## 2018-12-19 ENCOUNTER — Encounter: Payer: Self-pay | Admitting: Radiation Oncology

## 2018-12-19 ENCOUNTER — Ambulatory Visit
Admission: RE | Admit: 2018-12-19 | Discharge: 2018-12-19 | Disposition: A | Discharge status: Home / Self Care | Payer: BC Managed Care – PPO | Source: Ambulatory Visit | Attending: Radiation Oncology | Admitting: Radiation Oncology

## 2018-12-19 ENCOUNTER — Ambulatory Visit: Payer: Self-pay | Admitting: General Surgery

## 2018-12-19 DIAGNOSIS — Z8049 Family history of malignant neoplasm of other genital organs: Secondary | ICD-10-CM | POA: Diagnosis not present

## 2018-12-19 DIAGNOSIS — Z170 Estrogen receptor positive status [ER+]: Secondary | ICD-10-CM

## 2018-12-19 DIAGNOSIS — C50211 Malignant neoplasm of upper-inner quadrant of right female breast: Secondary | ICD-10-CM | POA: Insufficient documentation

## 2018-12-19 DIAGNOSIS — Z87891 Personal history of nicotine dependence: Secondary | ICD-10-CM | POA: Insufficient documentation

## 2018-12-19 DIAGNOSIS — R293 Abnormal posture: Secondary | ICD-10-CM | POA: Insufficient documentation

## 2018-12-19 DIAGNOSIS — Z880 Allergy status to penicillin: Secondary | ICD-10-CM | POA: Insufficient documentation

## 2018-12-19 DIAGNOSIS — Z79899 Other long term (current) drug therapy: Secondary | ICD-10-CM | POA: Insufficient documentation

## 2018-12-19 DIAGNOSIS — C50919 Malignant neoplasm of unspecified site of unspecified female breast: Secondary | ICD-10-CM

## 2018-12-19 HISTORY — PX: BREAST LUMPECTOMY: SHX2

## 2018-12-19 HISTORY — DX: Malignant neoplasm of unspecified site of unspecified female breast: C50.919

## 2018-12-19 LAB — CBC WITH DIFFERENTIAL (CANCER CENTER ONLY)
Abs Immature Granulocytes: 0.01 10*3/uL (ref 0.00–0.07)
Basophils Absolute: 0 10*3/uL (ref 0.0–0.1)
Basophils Relative: 0 %
Eosinophils Absolute: 0 10*3/uL (ref 0.0–0.5)
Eosinophils Relative: 1 %
HCT: 44.8 % (ref 36.0–46.0)
Hemoglobin: 14.6 g/dL (ref 12.0–15.0)
Immature Granulocytes: 0 %
Lymphocytes Relative: 23 %
Lymphs Abs: 1.5 10*3/uL (ref 0.7–4.0)
MCH: 28.7 pg (ref 26.0–34.0)
MCHC: 32.6 g/dL (ref 30.0–36.0)
MCV: 88.2 fL (ref 80.0–100.0)
Monocytes Absolute: 0.4 10*3/uL (ref 0.1–1.0)
Monocytes Relative: 6 %
Neutro Abs: 4.4 10*3/uL (ref 1.7–7.7)
Neutrophils Relative %: 70 %
Platelet Count: 256 10*3/uL (ref 150–400)
RBC: 5.08 MIL/uL (ref 3.87–5.11)
RDW: 12.2 % (ref 11.5–15.5)
WBC Count: 6.4 10*3/uL (ref 4.0–10.5)
nRBC: 0 % (ref 0.0–0.2)

## 2018-12-19 LAB — CMP (CANCER CENTER ONLY)
ALT: 32 U/L (ref 0–44)
AST: 20 U/L (ref 15–41)
Albumin: 4.2 g/dL (ref 3.5–5.0)
Alkaline Phosphatase: 34 U/L — ABNORMAL LOW (ref 38–126)
Anion gap: 10 (ref 5–15)
BUN: 10 mg/dL (ref 6–20)
CO2: 23 mmol/L (ref 22–32)
Calcium: 8.9 mg/dL (ref 8.9–10.3)
Chloride: 106 mmol/L (ref 98–111)
Creatinine: 0.8 mg/dL (ref 0.44–1.00)
GFR, Est AFR Am: >60 mL/min (ref >60)
GFR, Estimated: >60 mL/min (ref >60)
Glucose, Bld: 93 mg/dL (ref 70–99)
Potassium: 3.6 mmol/L (ref 3.5–5.1)
Sodium: 139 mmol/L (ref 135–145)
Total Bilirubin: 0.4 mg/dL (ref 0.3–1.2)
Total Protein: 7.3 g/dL (ref 6.5–8.1)

## 2018-12-19 NOTE — Progress Notes (Signed)
Radiation Oncology         [phone] ________________________________  Initial outpatient Consultation  Name: Debbie Berry MRN: [id number]  Date: 12/19/2018  DOB: May 19, 1971  [place]:[facility], Marsh Dolly., MD  Jovita Kussmaul, MD   REFERRING PHYSICIAN: Autumn Messing III, MD  DIAGNOSIS:    ICD-10-CM   1. Malignant neoplasm of upper-inner quadrant of right breast in female, estrogen receptor positive ([place])  C50.211    Z17.0    Cancer Staging Malignant neoplasm of upper-inner quadrant of right breast in female, estrogen receptor positive ([place]) Staging form: Breast, AJCC 8th Edition - Clinical stage from 12/19/2018: Stage IA (cT1b, cN0, cM0, G1, ER+, PR+, HER2-) - Signed by Nicholas Lose, MD on 12/19/2018   CHIEF COMPLAINT: Here to discuss management of right breast cancer  HISTORY OF PRESENT ILLNESS:Debbie Berry is a 48 y.o. female who presented with breast abnormality on the following imaging: right breast distortion on mammogram.  Symptoms, if any, at that time, were: none.   Ultrasound of breast revealed 4m mass at 1:00 right breast, axilla negative.   Biopsy of right breast mass at 1:00 showed invasive ductal carcinoma.  ER status: +; PR status +, Her2 status neg; Grade 1.  She otherwise feels well.  PREVIOUS RADIATION THERAPY: No  PAST MEDICAL HISTORY:  has a past medical history of Migraine.    PAST SURGICAL HISTORY: Past Surgical History:  Procedure Laterality Date   CHOLECYSTECTOMY     HERNIA REPAIR      FAMILY HISTORY: family history includes Cervical cancer in her paternal aunt and paternal grandmother.  SOCIAL HISTORY:  reports that she has quit smoking. Her smoking use included cigarettes. She has never used smokeless tobacco. She reports current alcohol use. She reports that she does not use drugs.  ALLERGIES: Penicillins  MEDICATIONS:  Current Outpatient Medications  Medication Sig Dispense Refill   cetirizine (ZYRTEC) 10 MG tablet Take 10 mg by mouth daily as needed  for allergies.     propranolol (INDERAL) 60 MG tablet Take 60 mg by mouth daily.     triamcinolone (NASACORT) 55 MCG/ACT AERO nasal inhaler Place 2 sprays into the nose 3 (three) times a week.     No current facility-administered medications for this encounter.     REVIEW OF SYSTEMS: As above   PHYSICAL EXAM:   Vitals - 1 value per visit 73/12/6281 SYSTOLIC 1151 DIASTOLIC 86  Pulse 84  Temperature 98.7  Respirations 18  Weight (lb) 146.9  Height 5' 6"   BMI 23.71  VISIT REPORT    General: Alert and oriented, in no acute distress Psychiatric: Judgment and insight are intact. Affect is appropriate. Breasts:No palpable masses appreciated in the breasts or axillae  . Ext: no edema Skin: no rashes   ECOG = 0  0 - Asymptomatic (Fully active, able to carry on all predisease activities without restriction)  1 - Symptomatic but completely ambulatory (Restricted in physically strenuous activity but ambulatory and able to carry out work of a light or sedentary nature. For example, light housework, office work)  2 - Symptomatic, <50% in bed during the day (Ambulatory and capable of all self care but unable to carry out any work activities. Up and about more than 50% of waking hours)  3 - Symptomatic, >50% in bed, but not bedbound (Capable of only limited self-care, confined to bed or chair 50% or more of waking hours)  4 - Bedbound (Completely disabled. Cannot carry on any self-care. Totally confined  to bed or chair)  5 - Death   Eustace Pen MM, Creech RH, Tormey DC, et al. [phone]). "Toxicity and response criteria of the [facility]". [place] Oncol. 5 (6): 649-55   LABORATORY DATA:  Lab Results  Component Value Date   WBC 6.4 12/19/2018   HGB 14.6 12/19/2018   HCT 44.8 12/19/2018   MCV 88.2 12/19/2018   PLT 256 12/19/2018   CMP     Component Value Date/Time   NA 139 12/19/2018 1219   K 3.6 12/19/2018 1219   CL 106 12/19/2018 1219   CO2 23 12/19/2018  1219   GLUCOSE 93 12/19/2018 1219   BUN 10 12/19/2018 1219   CREATININE 0.80 12/19/2018 1219   CALCIUM 8.9 12/19/2018 1219   PROT 7.3 12/19/2018 1219   ALBUMIN 4.2 12/19/2018 1219   AST 20 12/19/2018 1219   ALT 32 12/19/2018 1219   ALKPHOS 34 (L) 12/19/2018 1219   BILITOT 0.4 12/19/2018 1219   GFRNONAA >60 12/19/2018 1219   GFRAA >60 12/19/2018 1219         RADIOGRAPHY: US Breast Ltd Uni Right Inc Axilla  Result Date: 12/06/2018 CLINICAL DATA:  Patient was called back from screening mammogram for possible right breast distortion. EXAM: DIGITAL DIAGNOSTIC RIGHT MAMMOGRAM WITH CAD AND TOMO ULTRASOUND RIGHT BREAST COMPARISON:  Previous exam(s). ACR Breast Density Category c: The breast tissue is heterogeneously dense, which may obscure small masses. FINDINGS: Additional imaging of the right breast was performed. There is persistent distortion in the upper-inner quadrant of the breast. There are no malignant type microcalcifications. Mammographic images were processed with CAD. On physical exam, no mass is palpated in the upper-inner quadrant of the right breast. Targeted ultrasound is performed, showing an irregular hypoechoic mass in the right breast at 1 o'clock 2 cm from the nipple measuring 8 x 7 x 8 mm. Sonographic evaluation of the right axilla does not show any enlarged adenopathy. IMPRESSION: Suspicious mass and distortion in the 1 o'clock region of the right breast. RECOMMENDATION: Ultrasound-guided core biopsy of the 1 o'clock region of the right breast is recommended. The biopsy will be scheduled the patient's convenience. I have discussed the findings and recommendations with the patient. Results were also provided in writing at the conclusion of the visit. If applicable, a reminder letter will be sent to the patient regarding the next appointment. BI-RADS CATEGORY  4: Suspicious. Electronically Signed   By: Lillia Mountain M.D.   On: 12/06/2018 10:49   Mm Diag Breast Tomo Uni  Right  Result Date: 12/06/2018 CLINICAL DATA:  Patient was called back from screening mammogram for possible right breast distortion. EXAM: DIGITAL DIAGNOSTIC RIGHT MAMMOGRAM WITH CAD AND TOMO ULTRASOUND RIGHT BREAST COMPARISON:  Previous exam(s). ACR Breast Density Category c: The breast tissue is heterogeneously dense, which may obscure small masses. FINDINGS: Additional imaging of the right breast was performed. There is persistent distortion in the upper-inner quadrant of the breast. There are no malignant type microcalcifications. Mammographic images were processed with CAD. On physical exam, no mass is palpated in the upper-inner quadrant of the right breast. Targeted ultrasound is performed, showing an irregular hypoechoic mass in the right breast at 1 o'clock 2 cm from the nipple measuring 8 x 7 x 8 mm. Sonographic evaluation of the right axilla does not show any enlarged adenopathy. IMPRESSION: Suspicious mass and distortion in the 1 o'clock region of the right breast. RECOMMENDATION: Ultrasound-guided core biopsy of the 1 o'clock region of the right  breast is recommended. The biopsy will be scheduled the patient's convenience. I have discussed the findings and recommendations with the patient. Results were also provided in writing at the conclusion of the visit. If applicable, a reminder letter will be sent to the patient regarding the next appointment. BI-RADS CATEGORY  4: Suspicious. Electronically Signed   By: Lillia Mountain M.D.   On: 12/06/2018 10:49   Mm Clip Placement Right  Result Date: 12/10/2018 CLINICAL DATA:  Status post ultrasound-guided core needle biopsy of a right breast mass. EXAM: DIAGNOSTIC RIGHT MAMMOGRAM POST ULTRASOUND BIOPSY COMPARISON:  Previous exam(s). FINDINGS: Mammographic images were obtained following ultrasound guided biopsy of a right breast mass. The ribbon shaped biopsy clip lies within the mass. IMPRESSION: Well-positioned ribbon shaped biopsy clip following  ultrasound-guided core needle biopsy of the right breast. Final Assessment: Post Procedure Mammograms for Marker Placement Electronically Signed   By: Lajean Manes M.D.   On: 12/10/2018 16:53   [place] Rt Breast Bx W Loc Dev 1st Lesion Img Bx Spec US Guide  Addendum Date: 12/11/2018   ADDENDUM REPORT: 12/11/2018 15:59 ADDENDUM: Pathology revealed GRADE I INVASIVE DUCTAL CARCINOMA, DUCTAL CARCINOMA IN SITU of the RIGHT breast, upper inner quadrant, 1 o'clock, 2 cm from nipple. This was found to be concordant by Dr. Lajean Manes. Pathology results were discussed with the patient by Dr. Vania Rea. The patient reported doing well after the biopsy with tenderness at the site. Post biopsy instructions and care were reviewed and questions were answered. The patient was encouraged to call [facility] for any additional concerns. The patient was referred to [facility] on December 19, 2018. Pathology results reported by Stacie Acres, RN on 12/11/2018. Electronically Signed   By: Lajean Manes M.D.   On: 12/11/2018 15:59   Result Date: 12/11/2018 CLINICAL DATA:  Patient presents for ultrasound-guided core needle biopsy of an 8 mm mass in the right breast 1 o'clock, 2 cm from the nipple. EXAM: ULTRASOUND GUIDED RIGHT BREAST CORE NEEDLE BIOPSY COMPARISON:  Previous exam(s). FINDINGS: I met with the patient and we discussed the procedure of ultrasound-guided biopsy, including benefits and alternatives. We discussed the high likelihood of a successful procedure. We discussed the risks of the procedure, including infection, bleeding, tissue injury, clip migration, and inadequate sampling. Informed written consent was given. The usual time-out protocol was performed immediately prior to the procedure. Lesion quadrant: Upper inner quadrant Using sterile technique and 1% Lidocaine as local anesthetic, under direct ultrasound  visualization, a 12 gauge spring-loaded device was used to perform biopsy of the small right breast mass using an inferior approach. At the conclusion of the procedure a ribbon shaped tissue marker clip was deployed into the biopsy cavity. Follow up 2 view mammogram was performed and dictated separately. IMPRESSION: Ultrasound guided biopsy of a right breast mass. No apparent complications. Electronically Signed: By: Lajean Manes M.D. On: 12/10/2018 16:30      IMPRESSION/PLAN: right breast cancer  She has been discussed at our multidisciplinary tumor board.  The consensus is that she would be a good candidate for breast conservation. I talked to her about the option of a mastectomy and informed her that her expected overall survival would be equivalent between mastectomy and breast conservation, based upon randomized controlled data. She is enthusiastic about breast conservation.  It was a pleasure meeting the patient today. We discussed the risks, benefits, and side effects of radiotherapy.  I recommend radiotherapy to the right breast to reduce her risk of locoregional recurrence by 2/3.  We discussed that radiation would take approximately 4 weeks to complete and that I would give the patient a few weeks to heal following surgery before starting treatment planning.  If chemotherapy were to be given, this would precede radiotherapy. We spoke about acute effects including skin irritation and fatigue as well as much less common late effects including internal organ injury or irritation. We spoke about the latest technology that is used to minimize the risk of late effects for patients undergoing radiotherapy to the breast or chest wall. No guarantees of treatment were given. The patient is enthusiastic about proceeding with treatment. I look forward to participating in the patient's care.  I will await her referral back to me for postoperative follow-up and eventual CT simulation/treatment planning.    __________________________________________   Eppie Gibson, MD

## 2018-12-19 NOTE — Therapy (Signed)
[facility] [address] [place], [place], [address] Phone: [phone]   Fax:  [phone]  Physical Therapy Evaluation  Patient Details  Name: Debbie Berry MRN: [id number] Date of Birth: 04/10/1971 Referring Provider (PT): Dr. Paul Toth   Encounter Date: 12/19/2018  PT End of Session - 12/19/18 1705    Visit Number  1    Number of Visits  2    Date for PT Re-Evaluation  02/13/19    PT Start Time  1315    PT Stop Time  1338    PT Time Calculation (min)  23 min    Activity Tolerance  Patient tolerated treatment well    Behavior During Therapy  WFL for tasks assessed/performed       Past Medical History:  Diagnosis Date  . Migraine     Past Surgical History:  Procedure Laterality Date  . CHOLECYSTECTOMY    . HERNIA REPAIR      There were no vitals filed for this visit.   Subjective Assessment - 12/19/18 1656    Subjective  Patient report she is here today to be seen by her medical team for her newly diagnosed right breast cancer.    Pertinent History  Paitent was diagnosed on 12/04/2018 with right invasive ductal carcinoma breast cancer. It measures 8 mm and is located in the upper inner quadrant. It is ER/PR positive and HER2 negative with a Ki67 of 10%.    Patient Stated Goals  Reduce lymphedema risk and learn post op shoulder ROM HEP    Currently in Pain?  No/denies         [facility] PT Assessment - 12/19/18 0001      Assessment   Medical Diagnosis  Right breast cancer    Referring Provider (PT)  Dr. Paul Toth    Onset Date/Surgical Date  12/04/18    Hand Dominance  Right    Prior Therapy  none      Precautions   Precautions  Other (comment)    Precaution Comments  active cancer      Restrictions   Weight Bearing Restrictions  No      Balance Screen   Has the patient fallen in the past 6 months  No    Has the patient had a decrease in activity level because of a fear of falling?   No    Is the  patient reluctant to leave their home because of a fear of falling?   No      Home Environment   Living Environment  Private residence    Living Arrangements  Spouse/significant other;Children   Husband and 19 and 23 y.o. girls   Available Help at Discharge  Family      Prior Function   Level of Independence  Independent    Vocation  Full time employment    Vocation Requirements  Accounting    Leisure  She walks 3-5x/week for 30 minutes      Cognition   Overall Cognitive Status  Within Functional Limits for tasks assessed      Posture/Postural Control   Posture/Postural Control  Postural limitations    Postural Limitations  Rounded Shoulders;Forward head      ROM / Strength   AROM / PROM / Strength  AROM;Strength      AROM   Overall AROM Comments  Cervical AROM is WNL    AROM Assessment Site  Shoulder    Right/Left Shoulder  Right;Left      Right Shoulder Extension  54 Degrees    Right Shoulder Flexion  159 Degrees    Right Shoulder ABduction  172 Degrees    Right Shoulder Internal Rotation  66 Degrees    Right Shoulder External Rotation  86 Degrees    Left Shoulder Extension  57 Degrees    Left Shoulder Flexion  147 Degrees    Left Shoulder ABduction  160 Degrees    Left Shoulder Internal Rotation  68 Degrees    Left Shoulder External Rotation  90 Degrees      Strength   Overall Strength  Within functional limits for tasks performed        LYMPHEDEMA/ONCOLOGY QUESTIONNAIRE - 12/19/18 1702      Type   Cancer Type  Right breast cancer      Lymphedema Assessments   Lymphedema Assessments  Upper extremities      Right Upper Extremity Lymphedema   10 cm Proximal to Olecranon Process  27.2 cm    Olecranon Process  24.8 cm    10 cm Proximal to Ulnar Styloid Process  22.9 cm    Just Proximal to Ulnar Styloid Process  15 cm    Across Hand at Thumb Web Space  17.7 cm    At Base of 2nd Digit  5.6 cm      Left Upper Extremity Lymphedema   10 cm Proximal to Olecranon  Process  28.7 cm    Olecranon Process  24.6 cm    10 cm Proximal to Ulnar Styloid Process  22 cm    Just Proximal to Ulnar Styloid Process  15.2 cm    Across Hand at Thumb Web Space  17.4 cm    At Base of 2nd Digit  5.4 cm          Quick Dash - 12/19/18 0001    Open a tight or new jar  Mild difficulty    Do heavy household chores (wash walls, wash floors)  No difficulty    Carry a shopping bag or briefcase  No difficulty    Wash your back  No difficulty    Use a knife to cut food  No difficulty    Recreational activities in which you take some force or impact through your arm, shoulder, or hand (golf, hammering, tennis)  No difficulty    During the past week, to what extent has your arm, shoulder or hand problem interfered with your normal social activities with family, friends, neighbors, or groups?  Not at all    During the past week, to what extent has your arm, shoulder or hand problem limited your work or other regular daily activities  Not at all    Arm, shoulder, or hand pain.  None    Tingling (pins and needles) in your arm, shoulder, or hand  None    Difficulty Sleeping  No difficulty    DASH Score  2.27 %        Objective measurements completed on examination: See above findings.     Patient was instructed today in a home exercise program today for post op shoulder range of motion. These included active assist shoulder flexion in sitting, scapular retraction, wall walking with shoulder abduction, and hands behind head external rotation.  She was encouraged to do these twice a day, holding 3 seconds and repeating 5 times when permitted by her physician.      PT Education - 12/19/18 1703    Education Details  Lymphedema risk reduction   and post op shoulder ROM HEP    Person(s) Educated  Patient    Methods  Explanation;Demonstration;Handout    Comprehension  Verbalized understanding;Returned demonstration          PT Long Term Goals - 12/19/18 1717      PT  LONG TERM GOAL #1   Title  Patient will demonstrate she has regained full shoulder ROM and function post operatively compared to baseline.    Time  8    Period  Weeks    Status  New    Target Date  02/13/19      Breast Clinic Goals - 12/19/18 1713      Patient will be able to verbalize understanding of pertinent lymphedema risk reduction practices relevant to her diagnosis specifically related to skin care.   Period  Days    Status  Achieved    Target Date  12/19/18      Patient will be able to return demonstrate and/or verbalize understanding of the post-op home exercise program related to regaining shoulder range of motion.   Time  1    Period  Days    Status  Achieved    Target Date  12/19/18      Patient will be able to verbalize understanding of the importance of attending the postoperative After Breast Cancer Class for further lymphedema risk reduction education and therapeutic exercise.   Time  1    Period  Days    Status  Achieved    Target Date  12/19/18            Plan - 12/19/18 1706    Clinical Impression Statement  Paitent was diagnosed on 12/04/2018 with right invasive ductal carcinoma breast cancer. It measures 8 mm and is located in the upper inner quadrant. It is ER/PR positive and HER2 negative with a Ki67 of 10%. Her multidisciplinary medical team met prior to her assessments to determine a recommended treatment plan. She is planning to have a right lumpectomy and sentinel node biopsy followed by radiation and anti-estrogen therapy. She will benefit from a post op PT assessment to determine needs.    Stability/Clinical Decision Making  Stable/Uncomplicated    Clinical Decision Making  Low    Rehab Potential  Excellent    PT Treatment/Interventions  ADLs/Self Care Home Management;Therapeutic exercise;Patient/family education    PT Next Visit Plan  Will reassess 3-4 weeks post op to determine needs    PT Home Exercise Plan  Post op shoulder ROM HEP     Consulted and Agree with Plan of Care  Patient       Patient will benefit from skilled therapeutic intervention in order to improve the following deficits and impairments:  Decreased knowledge of precautions, Impaired UE functional use, Pain, Postural dysfunction, Decreased range of motion  Visit Diagnosis: 1. Malignant neoplasm of upper-inner quadrant of right breast in female, estrogen receptor positive ([place])   2. Abnormal posture      Patient will follow up at outpatient cancer rehab 3-4 weeks following surgery.  If the patient requires physical therapy at that time, a specific plan will be dictated and sent to the referring physician for approval. The patient was educated today on appropriate basic range of motion exercises to begin post operatively and the importance of attending the After Breast Cancer class following surgery.  Patient was educated today on lymphedema risk reduction practices as it pertains to recommendations that will benefit the patient immediately following surgery.  She verbalized  good understanding.      Problem List Patient Active Problem List   Diagnosis Date Noted  . Malignant neoplasm of upper-inner quadrant of right breast in female, estrogen receptor positive ([place]) 12/12/2018  . Allergic rhinitis 03/26/2015  . Cephalgia 03/26/2015  . Laryngopharyngeal reflux (LPR) 03/26/2015   Annia Friendly, PT 12/19/18 5:20 PM  [place], [place], [address] Phone: [phone]   Fax:  [phone]  Name: Debbie Berry MRN: [id number] Date of Birth: 09/12/1970

## 2018-12-19 NOTE — Assessment & Plan Note (Signed)
12/12/2018:Screening mammogram detected a distortion in the right breast, measuring 57m on diagnostic mammogram and [place] Biopsy confirmed grade 1 IDC with DCIS, HER-2 negative by FISH, ER 90%, PR 90%, Ki67 10%.  Pathology and radiology counseling: Discussed with the patient, the details of pathology including the type of breast cancer,the clinical staging, the significance of ER, PR and HER-2/neu receptors and the implications for treatment. After reviewing the pathology in detail, we proceeded to discuss the different treatment options between surgery, radiation, chemotherapy, antiestrogen therapies.  Treatment plan: 1.  Breast conserving surgery with sentinel lymph node biopsy 2.  Adjuvant radiation therapy 3.  Adjuvant antiestrogen therapy  I discussed with the patient that she does not need chemotherapy based upon the initial radiology findings.  If the final tumor is greater than 10 mm then we will consider sending for Oncotype DX.  I will discuss with her after surgery through a D[employer] video visit.

## 2018-12-19 NOTE — Patient Instructions (Signed)

## 2018-12-20 ENCOUNTER — Encounter: Payer: Self-pay | Admitting: *Deleted

## 2018-12-24 ENCOUNTER — Telehealth: Payer: Self-pay | Admitting: *Deleted

## 2018-12-24 ENCOUNTER — Encounter: Payer: Self-pay | Admitting: General Practice

## 2018-12-24 NOTE — Telephone Encounter (Signed)
Spoke to pt concerning [place] from 7.1.20. Denies questions regarding dx. Discussed genetic testing will take up to 14 days to return. Pt informed she is trying to decide on the type of surgery she may have. Discussed no survival benefit between lump/xrt and mastectomy. Discussed if margins are positive after lumpectomy, further surgery might be recommended.  Encourage pt to call with further questions or needs. Received verbal understanding.

## 2018-12-24 NOTE — Telephone Encounter (Signed)
Pt informed she would like to proceed with genetic testing. Confirmed appt for 7/7 at 9am.

## 2018-12-24 NOTE — Progress Notes (Signed)
[place] Psychosocial Distress Screening Spiritual Care  Met with Debbie Berry by phone following Breast Multidisciplinary Clinic to introduce [place] team/resources, reviewing distress screen per protocol.  The patient scored a 8 on the Psychosocial Distress Thermometer which indicates severe distress. Also assessed for distress and other psychosocial needs.   ONCBCN DISTRESS SCREENING 12/24/2018  Screening Type Initial Screening  Distress experienced in past week (1-10) 8  Family Problem type Children  Emotional problem type Nervousness/Anxiety;Adjusting to illness  Information Concerns Type Lack of info about diagnosis;Lack of info about treatment  Physical Problem type Sleep/insomnia  Referral to support programs Yes   Per pt, her distress remains at 8 after [facility] because she hasn't reached clarity about lumpectomy vs mastectomy; she hopes that genetic testing results will help clarify. She is utilizing nurse navigator Jessee Avers for help and knows to reach out to Dr [facility] office with surgery-specific questions.  Follow up needed: No. Debbie Berry is aware of ongoing [place] team/programming availability and plans to email or call me as needed/desired for f/u support.   [place], [place], [facility] Pager [phone] Voicemail [phone]

## 2018-12-25 ENCOUNTER — Inpatient Hospital Stay (HOSPITAL_BASED_OUTPATIENT_CLINIC_OR_DEPARTMENT_OTHER): Payer: BC Managed Care – PPO | Admitting: Genetic Counselor

## 2018-12-25 ENCOUNTER — Inpatient Hospital Stay: Payer: BC Managed Care – PPO

## 2018-12-25 ENCOUNTER — Other Ambulatory Visit: Payer: Self-pay

## 2018-12-25 ENCOUNTER — Encounter: Payer: Self-pay | Admitting: Genetic Counselor

## 2018-12-25 DIAGNOSIS — C50211 Malignant neoplasm of upper-inner quadrant of right female breast: Secondary | ICD-10-CM

## 2018-12-25 DIAGNOSIS — Z170 Estrogen receptor positive status [ER+]: Secondary | ICD-10-CM

## 2018-12-25 DIAGNOSIS — Z7289 Other problems related to lifestyle: Secondary | ICD-10-CM

## 2018-12-25 DIAGNOSIS — Z8049 Family history of malignant neoplasm of other genital organs: Secondary | ICD-10-CM

## 2018-12-25 DIAGNOSIS — Z87891 Personal history of nicotine dependence: Secondary | ICD-10-CM

## 2018-12-25 NOTE — Progress Notes (Signed)
REFERRING PROVIDER: Nicholas Lose, MD [address] [place],  [place] [address]  PRIMARY PROVIDER:  Verdell Carmine., MD  PRIMARY REASON FOR VISIT:  1. Malignant neoplasm of upper-inner quadrant of right breast in female, estrogen receptor positive ([place])   2. Family history of cervical cancer      HISTORY OF PRESENT ILLNESS:   Debbie Berry, a 48 y.o. female, was seen for a [facility] cancer genetics consultation at the request of Dr. Lindi Adie due to a personal history of Breast Cancer.  Debbie Berry presents to clinic today to discuss the possibility of a hereditary predisposition to cancer, genetic testing, and to further clarify her future cancer risks, as well as potential cancer risks for family members.   In 2020, at the age of 48, Debbie Berry was diagnosed with Invasive Ductal Carcinoma, ER+/PR+/Her2-, of the right breast. The treatment plan includes surgery, radiation, and antiestrogen therapy.   CANCER HISTORY:  Oncology History  Malignant neoplasm of upper-inner quadrant of right breast in female, estrogen receptor positive ([place])  12/12/2018 Initial Diagnosis   Screening mammogram detected a distortion in the right breast, measuring 27m on diagnostic mammogram and [place] Biopsy confirmed grade 1 IDC with DCIS, HER-2 negative by FISH, ER 90%, PR 90%, Ki67 10%.    12/19/2018 Cancer Staging   Staging form: Breast, AJCC 8th Edition - Clinical stage from 12/19/2018: Stage IA (cT1b, cN0, cM0, G1, ER+, PR+, HER2-) - Signed by GNicholas Lose MD on 12/19/2018      RISK FACTORS:  Menarche was at age 48  First live birth at age 48  OCP use for approximately 30 years, on and off.  Ovaries intact: yes.  Hysterectomy: no.  Menopausal status: premenopausal.  HRT use: 0 years. Number of breast biopsies: 1.  Past Medical History:  Diagnosis Date   Breast cancer ([place]    Family history of cervical cancer    Migraine     Past Surgical History:  Procedure Laterality Date    CHOLECYSTECTOMY     HERNIA REPAIR      Social History   Socioeconomic History   Marital status: Married    Spouse name: Not on file   Number of children: Not on file   Years of education: Not on file   Highest education level: Not on file  Occupational History   Not on file  Social Needs   Financial resource strain: Not on file   Food insecurity    Worry: Not on file    Inability: Not on file   Transportation needs    Medical: Not on file    Non-medical: Not on file  Tobacco Use   Smoking status: Former Smoker    Types: Cigarettes   Smokeless tobacco: Never Used  Substance and Sexual Activity   Alcohol use: Yes   Drug use: Never   Sexual activity: Not on file  Lifestyle   Physical activity    Days per week: Not on file    Minutes per session: Not on file   Stress: Not on file  Relationships   Social connections    Talks on phone: Not on file    Gets together: Not on file    Attends religious service: Not on file    Active member of club or organization: Not on file    Attends meetings of clubs or organizations: Not on file    Relationship status: Not on file  Other Topics Concern   Not on file  Social  History Narrative   Not on file     FAMILY HISTORY:  We obtained a detailed, 4-generation family history.  Significant diagnoses are listed below: Family History  Problem Relation Age of Onset   Cervical cancer Paternal Grandmother    Cervical cancer Paternal Aunt 42   Debbie Berry has 2 daughters, Debbie Berry and Debbie Berry, in their 20s. She has one brother and one sister and multiple nieces and nephews. She has three maternal aunts and one maternal uncle with multiple first cousins, none of whom have had a diagnosis of cancer.   Debbie Berry has one paternal aunt who was diagnosed around age 60 with cervical cancer. Debbie Berry paternal grandmother was also diagnosed with cervical cancer at a younger age, possibly her 34s or 49s. There are no other  cancer diagnoses on her paternal side.  Debbie Berry is unaware of previous family history of genetic testing for hereditary cancer risks. Patient's maternal ancestors are of [place] descent, and paternal ancestors are of Scottish/Irish descent. There no reported Ashkenazi Jewish ancestry. There no known consanguinity.    GENETIC COUNSELING ASSESSMENT: Debbie Berry is a 47 y.o. female with a personal history of breast cancer which is slightly indicative of a hereditary cancer syndrome and predisposition to cancer. We, therefore, discussed and recommended the following at today's visit.   DISCUSSION: We discussed that 5 - 10% of breast cancer is hereditary, with most cases associated with BRCA1/2.  There are other genes that can be associated with hereditary breast cancer syndromes.  These include PALB2, CHEK2, ATM, etc.  We discussed that testing is beneficial for several reasons including surgical decision-making for breast cancer, knowing how to follow individuals after completing their treatment, and to understand if other family members could be at risk for cancer and allow them to undergo genetic testing.   We reviewed the characteristics, features and inheritance patterns of hereditary cancer syndromes. We also discussed genetic testing, including the appropriate family members to test, the process of testing, insurance coverage and turn-around-time for results. We discussed the implications of a negative, positive and/or variant of uncertain significant result. In order to get genetic test results in a timely manner so that Debbie Berry can use these genetic test results for surgical decisions, we recommended Debbie Berry pursue genetic testing for the [facility] Breast Cancer STAT panel. We discussed the option to pursue reflex genetic testing to the [facility] Common Hereditary Cancers gene panel after receiving the initial results. Debbie Berry is unsure at this time if she wants to pursue this reflex genetic  testing. She will have 90 days to add-on this reflex genetic testing with no additional charge.  The STAT Breast cancer panel offered by [facility] includes sequencing and rearrangement analysis for the following 9 genes:  ATM, BRCA1, BRCA2, CDH1, CHEK2, PALB2, PTEN, STK11 and TP53.     We discussed with Debbie Berry that the personal history does not meet insurance or NCCN criteria for genetic testing and, therefore, her insurance will likely not cover the cost of her genetic testing. The self-pay price for genetic testing will be $250.   PLAN: After considering the risks, benefits, and limitations, Debbie Berry provided informed consent to pursue genetic testing and the blood sample was sent to [facility] for analysis of the Breast Cancer STAT panel. Results should be available within approximately one weeks' time, at which point they will be disclosed by telephone to Debbie Berry, as will any additional recommendations warranted by these results. Debbie Berry will receive  a summary of her genetic counseling visit and a copy of her results once available. This information will also be available in Epic.   Debbie Berry questions were answered to her satisfaction today. Our contact information was provided should additional questions or concerns arise. Thank you for the referral and allowing [place] to share in the care of your patient.   Clint Guy, MS Genetic Counselor [email or web address] Phone: [phone]   The patient was seen for a total of 30 minutes in face-to-face genetic counseling.  This patient was discussed with Drs. Magrinat, Lindi Adie and/or Burr Medico who agrees with the above.   _______________________________________________________________________ For Office Staff:  Number of people involved in session: 1 Was an Intern/ student involved with case: no

## 2018-12-26 ENCOUNTER — Other Ambulatory Visit: Payer: Self-pay | Admitting: General Surgery

## 2018-12-26 DIAGNOSIS — C50211 Malignant neoplasm of upper-inner quadrant of right female breast: Secondary | ICD-10-CM

## 2018-12-27 ENCOUNTER — Telehealth: Payer: Self-pay | Admitting: Hematology and Oncology

## 2018-12-27 ENCOUNTER — Encounter: Payer: Self-pay | Admitting: *Deleted

## 2018-12-27 NOTE — Telephone Encounter (Signed)
Scheduled appt per 7/09 pt aware of appt date and time

## 2019-01-01 ENCOUNTER — Encounter: Payer: Self-pay | Admitting: *Deleted

## 2019-01-03 ENCOUNTER — Encounter: Payer: Self-pay | Admitting: Genetic Counselor

## 2019-01-03 ENCOUNTER — Ambulatory Visit: Payer: Self-pay | Admitting: Genetic Counselor

## 2019-01-03 ENCOUNTER — Telehealth: Payer: Self-pay | Admitting: Genetic Counselor

## 2019-01-03 DIAGNOSIS — Z1379 Encounter for other screening for genetic and chromosomal anomalies: Secondary | ICD-10-CM | POA: Insufficient documentation

## 2019-01-03 NOTE — Progress Notes (Signed)
HPI:  [name] was previously seen in the [facility] clinic due to a personal history of breast cancer and concerns regarding a hereditary predisposition to cancer. Please refer to our prior cancer genetics clinic note for more information regarding our discussion, assessment and recommendations, at the time. [name] recent genetic test results were disclosed to her, as were recommendations warranted by these results. These results and recommendations are discussed in more detail below.  CANCER HISTORY:  Oncology History  Malignant neoplasm of upper-inner quadrant of right breast in female, estrogen receptor positive ([place])  12/12/2018 Initial Diagnosis   Screening mammogram detected a distortion in the right breast, measuring 55m on diagnostic mammogram and [place] Biopsy confirmed grade 1 IDC with DCIS, HER-2 negative by FISH, ER 90%, PR 90%, Ki67 10%.    12/19/2018 Cancer Staging   Staging form: Breast, AJCC 8th Edition - Clinical stage from 12/19/2018: Stage IA (cT1b, cN0, cM0, G1, ER+, PR+, HER2-) - Signed by GNicholas Lose MD on 12/19/2018   01/02/2019 Genetic Testing   Negative genetic testing on the [facility] Breast Cancer STAT panel. The STAT Breast cancer panel offered by [facility] includes sequencing and rearrangement analysis for the following 9 genes:  ATM, BRCA1, BRCA2, CDH1, CHEK2, PALB2, PTEN, STK11 and TP53. The report date is 01/02/2019.     FAMILY HISTORY:  We obtained a detailed, 4-generation family history.  Significant diagnoses are listed below: Family History  Problem Relation Age of Onset  . Cervical cancer Paternal Grandmother   . Cervical cancer Paternal Aunt [phone]   Debbie Berry 2 daughters, Debbie Berry ETerrence Berry in their 20s. She has one brother and one sister and multiple nieces and nephews. She has three maternal aunts and one maternal uncle with multiple first cousins, none of whom have had a diagnosis of cancer.   Debbie Berry one paternal aunt who was  diagnosed around age 5668with cervical cancer. Debbie Berry grandmother was also diagnosed with cervical cancer at a younger age, possibly her 374sor 46s There are no other cancer diagnoses on her paternal side.  [name] of previous family history of genetic testing for hereditary cancer risks. Patient's maternal ancestors are of Debbie Berry, and paternal ancestors are of Scottish/Irish descent. There is no reported Ashkenazi Jewish ancestry. There is no known consanguinity.    GENETIC TEST RESULTS: Genetic testing reported out on 01/02/2019 through the [facility] Breast Cancer STAT panel found no pathogenic mutations. The STAT Breast cancer panel offered by [facility] includes sequencing and rearrangement analysis for the following 9 genes:  ATM, BRCA1, BRCA2, CDH1, CHEK2, PALB2, PTEN, STK11 and TP53. The test report has been scanned into EPIC and is located under the Molecular Pathology section of the Results Review tab.  A portion of the result report is included below for reference.     We discussed with [name] that because current genetic testing is not perfect, it is possible there may be a gene mutation in one of these genes that current testing cannot detect, but that chance is small.  We also discussed, that there could be another gene that has not yet been discovered, or that we have not yet tested, that is responsible for the cancer diagnoses in the family. It is also possible there is a hereditary cause for the cancer in the family that [name] did not inherit and therefore was not identified in her testing.  Therefore, it is important to remain in  touch with cancer genetics in the future so that we can continue to offer [name] the most up to date genetic testing.   ADDITIONAL GENETIC TESTING: We discussed with [name] that there are other genes that are associated with increased cancer risk that can be analyzed. Should [name] wish to pursue additional  genetic testing, we are happy to discuss and coordinate this testing, at any time.    CANCER SCREENING RECOMMENDATIONS: [name] test result is considered negative (normal).  This means that we have not identified a hereditary cause for her personal history of cancer at this time. Most cancers happen by chance and this negative test suggests that her cancer may fall into this category.    While reassuring, this does not definitively rule out a hereditary predisposition to cancer. It is still possible that there could be genetic mutations that are undetectable by current technology. There could be genetic mutations in genes that have not been tested or identified to increase cancer risk.  Therefore, it is recommended she continue to follow the cancer management and screening guidelines provided by her oncology and primary healthcare provider.   An individual's cancer risk and medical management are not determined by genetic test results alone. Overall cancer risk assessment incorporates additional factors, including personal medical history, family history, and any available genetic information that may result in a personalized plan for cancer prevention and surveillance  RECOMMENDATIONS FOR FAMILY MEMBERS:  Individuals in this family might be at some increased risk of developing cancer, over the general population risk, simply due to the family history of cancer.  We recommended women in this family have a yearly mammogram beginning at age 20, or 49 years younger than the earliest onset of cancer, an annual clinical breast exam, and perform monthly breast self-exams. Women in this family should also have a gynecological exam as recommended by their primary provider. All family members should have a colonoscopy by age 102.  FOLLOW-UP: Lastly, we discussed with [name] that cancer genetics is a rapidly advancing field and it is possible that new genetic tests will be appropriate for her and/or her  family members in the future. We encouraged her to remain in contact with cancer genetics on an annual basis so we can update her personal and family histories and let her know of advances in cancer genetics that may benefit this family.   Our contact number was provided. [name] questions were answered to her satisfaction, and she knows she is welcome to call us at anytime with additional questions or concerns.   Clint Guy, MS Genetic Counselor [email or web address] Phone: [phone]

## 2019-01-03 NOTE — Telephone Encounter (Signed)
Revealed negative genetic testing.  Discussed that we do not know why she has breast cancer or why there is cancer in the family. It could be due to a different gene that we are not testing, or maybe our current technology may not be able to pick something up.  It will be important for her to keep in contact with genetics to keep up with whether additional testing may be needed. 

## 2019-01-10 ENCOUNTER — Other Ambulatory Visit: Payer: Self-pay

## 2019-01-10 ENCOUNTER — Encounter (HOSPITAL_BASED_OUTPATIENT_CLINIC_OR_DEPARTMENT_OTHER): Payer: Self-pay | Admitting: *Deleted

## 2019-01-14 ENCOUNTER — Other Ambulatory Visit (HOSPITAL_COMMUNITY)
Admission: RE | Admit: 2019-01-14 | Discharge: 2019-01-14 | Disposition: A | Discharge status: Home / Self Care | Payer: BC Managed Care – PPO | Source: Ambulatory Visit | Attending: General Surgery | Admitting: General Surgery

## 2019-01-14 DIAGNOSIS — Z20828 Contact with and (suspected) exposure to other viral communicable diseases: Secondary | ICD-10-CM | POA: Diagnosis present

## 2019-01-14 LAB — SARS CORONAVIRUS 2 (TAT 6-24 HRS): SARS Coronavirus 2: NEGATIVE

## 2019-01-14 NOTE — Progress Notes (Signed)
Patient given Ensure presurgery drink with instructions to finish by 1030, pt voiced understanding.

## 2019-01-16 ENCOUNTER — Other Ambulatory Visit: Payer: Self-pay

## 2019-01-16 ENCOUNTER — Ambulatory Visit
Admission: RE | Admit: 2019-01-16 | Discharge: 2019-01-16 | Disposition: A | Discharge status: Home / Self Care | Payer: BC Managed Care – PPO | Source: Ambulatory Visit | Attending: General Surgery | Admitting: General Surgery

## 2019-01-16 DIAGNOSIS — C50211 Malignant neoplasm of upper-inner quadrant of right female breast: Secondary | ICD-10-CM

## 2019-01-16 IMAGING — MG MM PLC BREAST LOC DEV 1ST LESION INC*R*
7 series · 7 of 7 positions shown · non-contrast
Comparison: Previous exam(s).

CLINICAL DATA: Patient for preoperative localization prior to right
breast lumpectomy.

EXAM:
MAMMOGRAPHIC GUIDED RADIOACTIVE SEED LOCALIZATION OF THE RIGHT
BREAST

[R CC (1 of 3)]
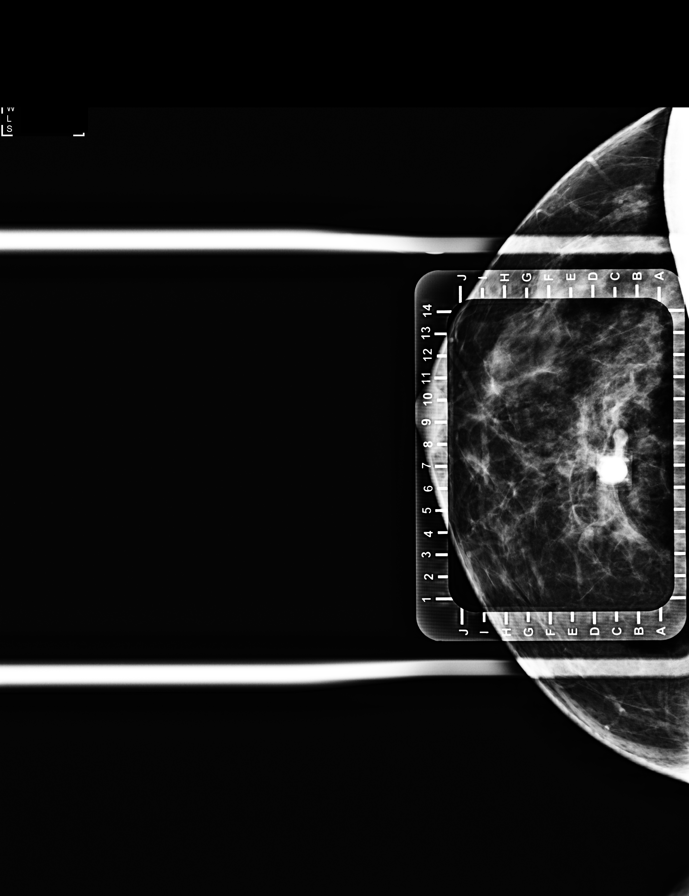

[R ML (1 of 4)]
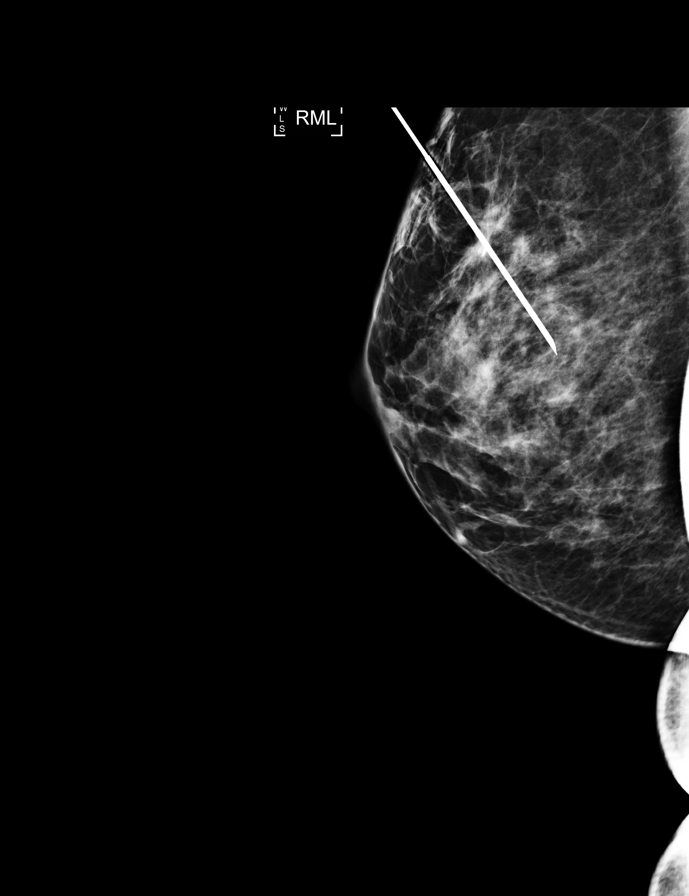

[R ML (2 of 4)]
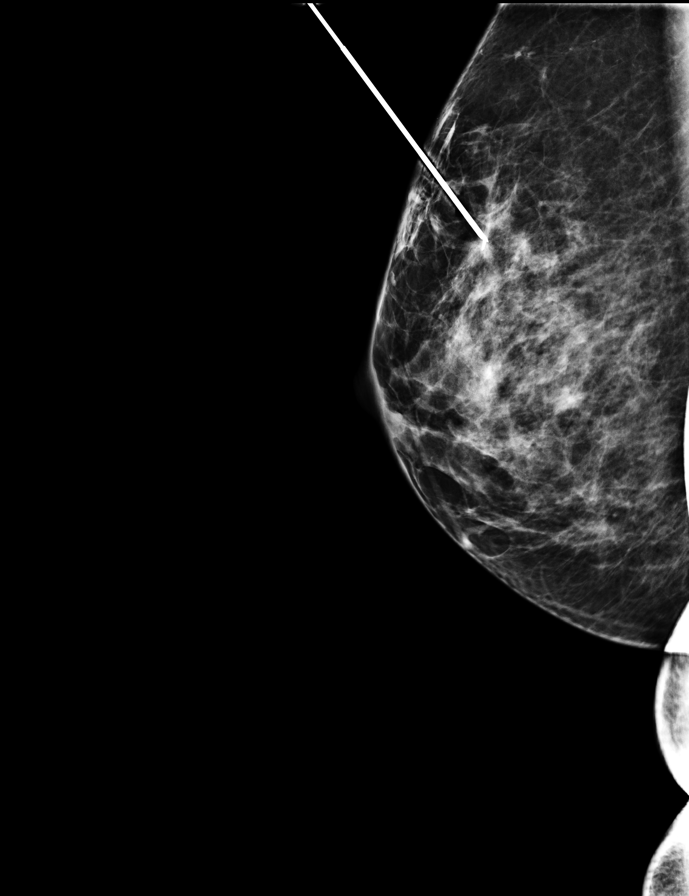

[R ML (3 of 4)]
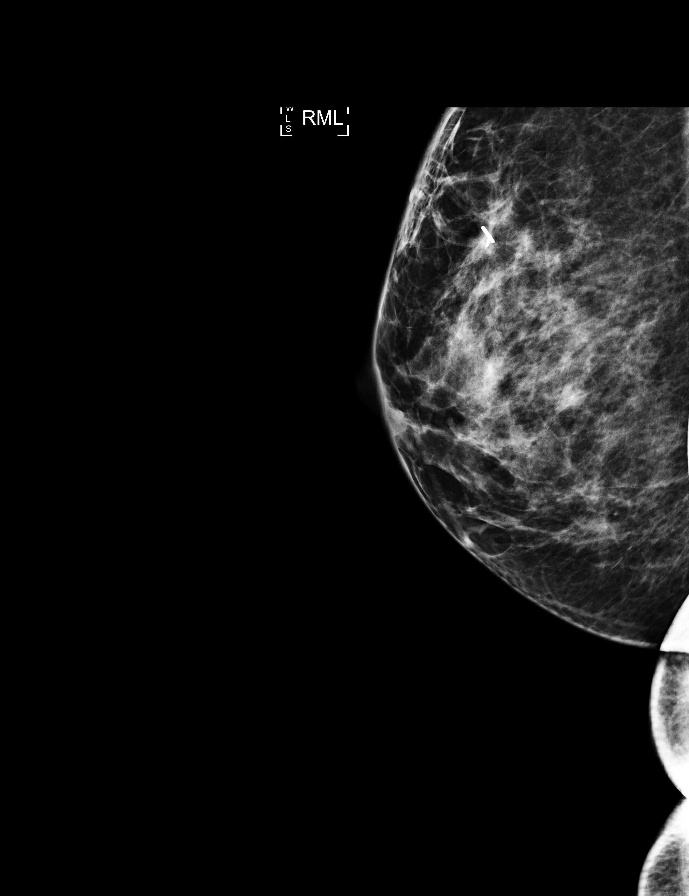

[R CC (2 of 3)]
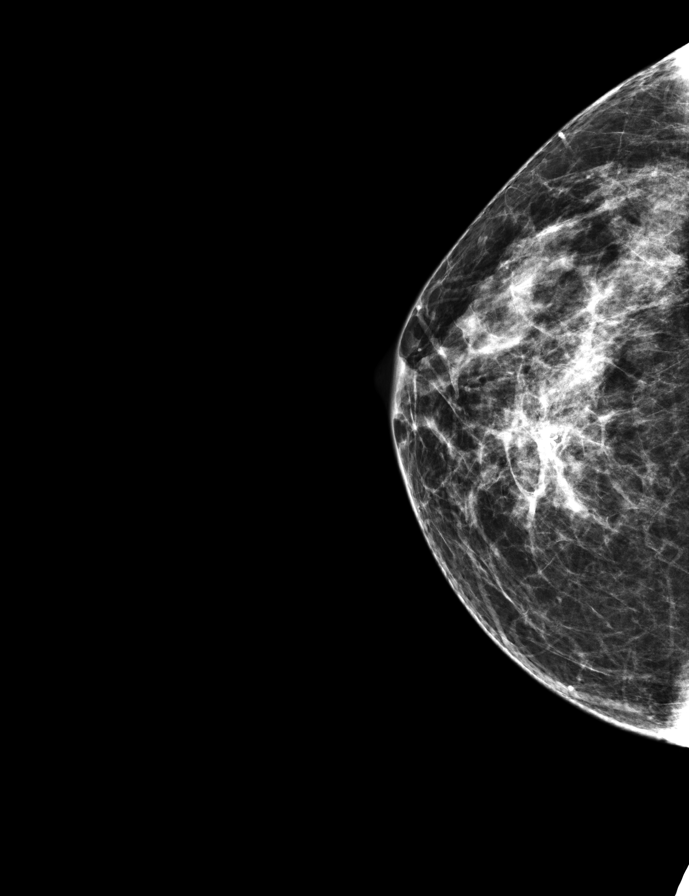

[R CC (3 of 3)]
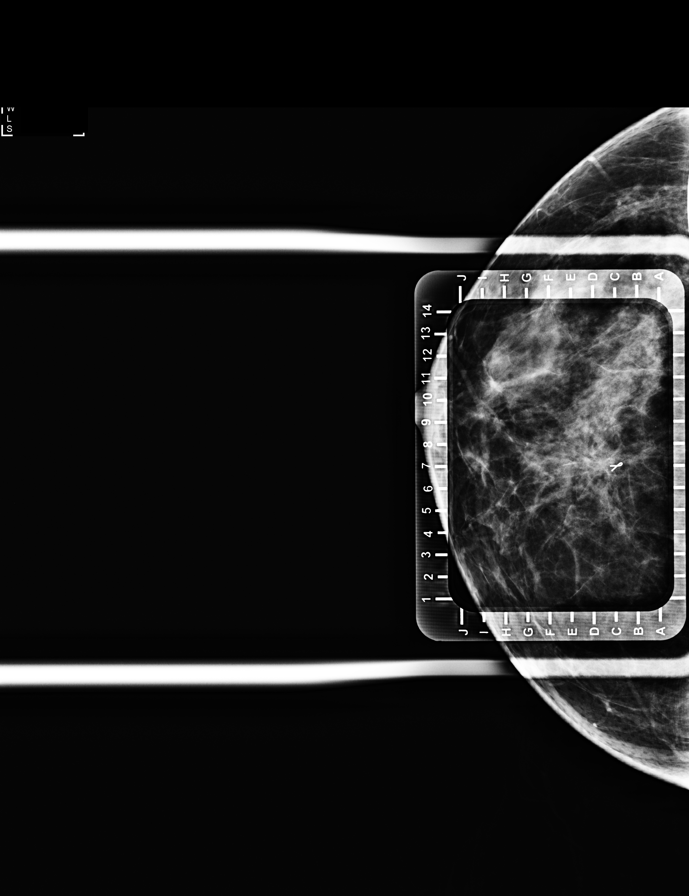

[R ML (4 of 4)]
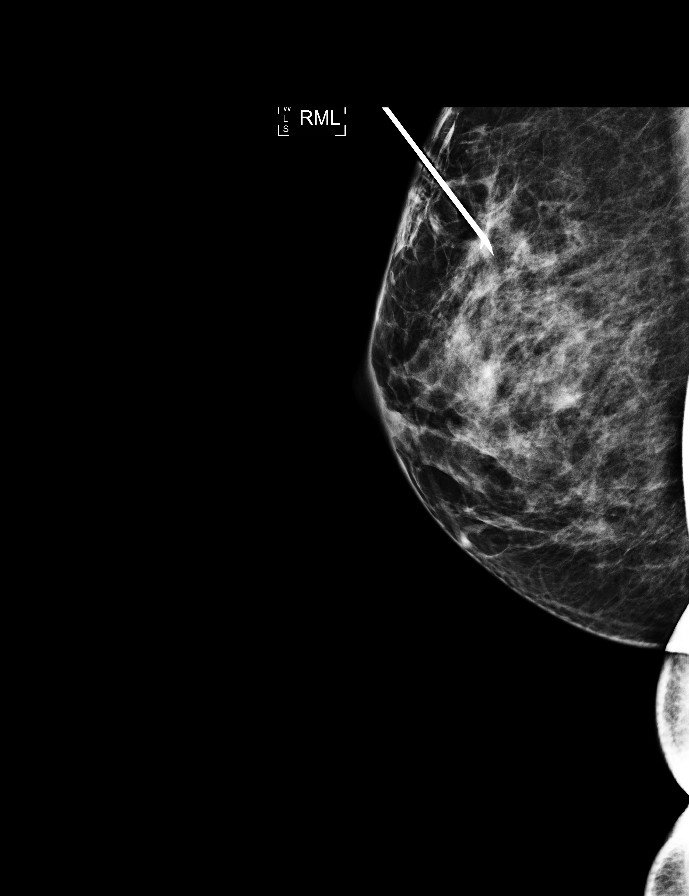

[7 of 7 positions shown; findings below may reference images not displayed]

FINDINGS: Patient presents for radioactive seed localization prior to right
breast lumpectomy. I met with the patient and we discussed the
procedure of seed localization including benefits and alternatives.
We discussed the high likelihood of a successful procedure. We
discussed the risks of the procedure including infection, bleeding,
tissue injury and further surgery. We discussed the low dose of
radioactivity involved in the procedure. Informed, written consent
was given.

The usual time-out protocol was performed immediately prior to the
procedure.

Using mammographic guidance, sterile technique, 1% lidocaine and an
[G3] radioactive seed, mass and ribbon shaped marking clip was
localized using a cranial approach. The follow-up mammogram images
confirm the seed in the expected location and were marked for Dr.
BALANDRA.

Follow-up survey of the patient confirms presence of the radioactive
seed.

Order number of [G3] seed:  [PHONE_NUMBER].

Total activity:  0.252 millicuries reference Date: [DATE]

The patient tolerated the procedure well and was released from the
[REDACTED]. She was given instructions regarding seed removal.
IMPRESSION: Radioactive seed localization right breast. No apparent
complications.

## 2019-01-16 NOTE — Anesthesia Preprocedure Evaluation (Addendum)
Anesthesia Evaluation  Patient identified by MRN, date of birth, ID band Patient awake    Reviewed: Allergy & Precautions, NPO status , Patient's Chart, lab work & pertinent test results  History of Anesthesia Complications Negative for: history of anesthetic complications  Airway Mallampati: II  TM Distance: >3 FB Neck ROM: Full    Dental no notable dental hx.    Pulmonary former smoker,    Pulmonary exam normal        Cardiovascular negative cardio ROS Normal cardiovascular exam     Neuro/Psych  Headaches,    GI/Hepatic negative GI ROS, Neg liver ROS,   Endo/Other  negative endocrine ROS  Renal/GU negative Renal ROS     Musculoskeletal negative musculoskeletal ROS (+)   Abdominal   Peds  Hematology negative hematology ROS (+)   Anesthesia Other Findings Breast ca  Day of surgery medications reviewed with the patient.  Reproductive/Obstetrics                            Anesthesia Physical Anesthesia Plan  ASA: II  Anesthesia Plan: General   Post-op Pain Management: GA combined w/ Regional for post-op pain   Induction: Intravenous  PONV Risk Score and Plan: 4 or greater and Midazolam, Dexamethasone, Ondansetron and Treatment may vary due to age or medical condition  Airway Management Planned: LMA  Additional Equipment:   Intra-op Plan:   Post-operative Plan: Extubation in OR  Informed Consent: I have reviewed the patients History and Physical, chart, labs and discussed the procedure including the risks, benefits and alternatives for the proposed anesthesia with the patient or authorized representative who has indicated his/her understanding and acceptance.     Dental advisory given  Plan Discussed with: CRNA  Anesthesia Plan Comments:        Anesthesia Quick Evaluation

## 2019-01-17 ENCOUNTER — Other Ambulatory Visit: Payer: Self-pay

## 2019-01-17 ENCOUNTER — Ambulatory Visit (HOSPITAL_BASED_OUTPATIENT_CLINIC_OR_DEPARTMENT_OTHER): Payer: BC Managed Care – PPO | Admitting: Anesthesiology

## 2019-01-17 ENCOUNTER — Ambulatory Visit (HOSPITAL_BASED_OUTPATIENT_CLINIC_OR_DEPARTMENT_OTHER)
Admission: RE | Admit: 2019-01-17 | Discharge: 2019-01-17 | Disposition: A | Discharge status: Home / Self Care | Payer: BC Managed Care – PPO | Attending: General Surgery | Admitting: General Surgery

## 2019-01-17 ENCOUNTER — Ambulatory Visit
Admission: RE | Admit: 2019-01-17 | Discharge: 2019-01-17 | Disposition: A | Discharge status: Home / Self Care | Payer: BC Managed Care – PPO | Source: Ambulatory Visit | Attending: General Surgery | Admitting: General Surgery

## 2019-01-17 ENCOUNTER — Encounter (HOSPITAL_COMMUNITY)
Admission: RE | Admit: 2019-01-17 | Discharge: 2019-01-17 | Disposition: A | Discharge status: Home / Self Care | Payer: BC Managed Care – PPO | Source: Ambulatory Visit | Attending: General Surgery | Admitting: General Surgery

## 2019-01-17 ENCOUNTER — Encounter (HOSPITAL_BASED_OUTPATIENT_CLINIC_OR_DEPARTMENT_OTHER)
Admission: RE | Disposition: A | Discharge status: Home / Self Care | Payer: Self-pay | Source: Home / Self Care | Attending: General Surgery

## 2019-01-17 ENCOUNTER — Encounter (HOSPITAL_BASED_OUTPATIENT_CLINIC_OR_DEPARTMENT_OTHER): Payer: Self-pay | Admitting: *Deleted

## 2019-01-17 DIAGNOSIS — C50211 Malignant neoplasm of upper-inner quadrant of right female breast: Secondary | ICD-10-CM

## 2019-01-17 DIAGNOSIS — Z87891 Personal history of nicotine dependence: Secondary | ICD-10-CM | POA: Diagnosis not present

## 2019-01-17 DIAGNOSIS — Z170 Estrogen receptor positive status [ER+]: Secondary | ICD-10-CM

## 2019-01-17 DIAGNOSIS — D0511 Intraductal carcinoma in situ of right breast: Secondary | ICD-10-CM | POA: Diagnosis not present

## 2019-01-17 HISTORY — PX: BREAST LUMPECTOMY WITH RADIOACTIVE SEED AND SENTINEL LYMPH NODE BIOPSY: SHX6550

## 2019-01-17 HISTORY — DX: Dizziness and giddiness: R42

## 2019-01-17 LAB — POCT PREGNANCY, URINE: Preg Test, Ur: NEGATIVE

## 2019-01-17 IMAGING — DX BREAST SURGICAL SPECIMEN
1 series · 2 of 2 positions shown · non-contrast
Comparison: Previous exam(s).

CLINICAL DATA: Status post surgical excision of a right breast
lesion following radioactive seed localization.

EXAM:
SPECIMEN RADIOGRAPH OF THE RIGHT BREAST

[Series 2: specimen digital x-ray, derived · right · 2 of 2 slices shown]
[im 1/2]
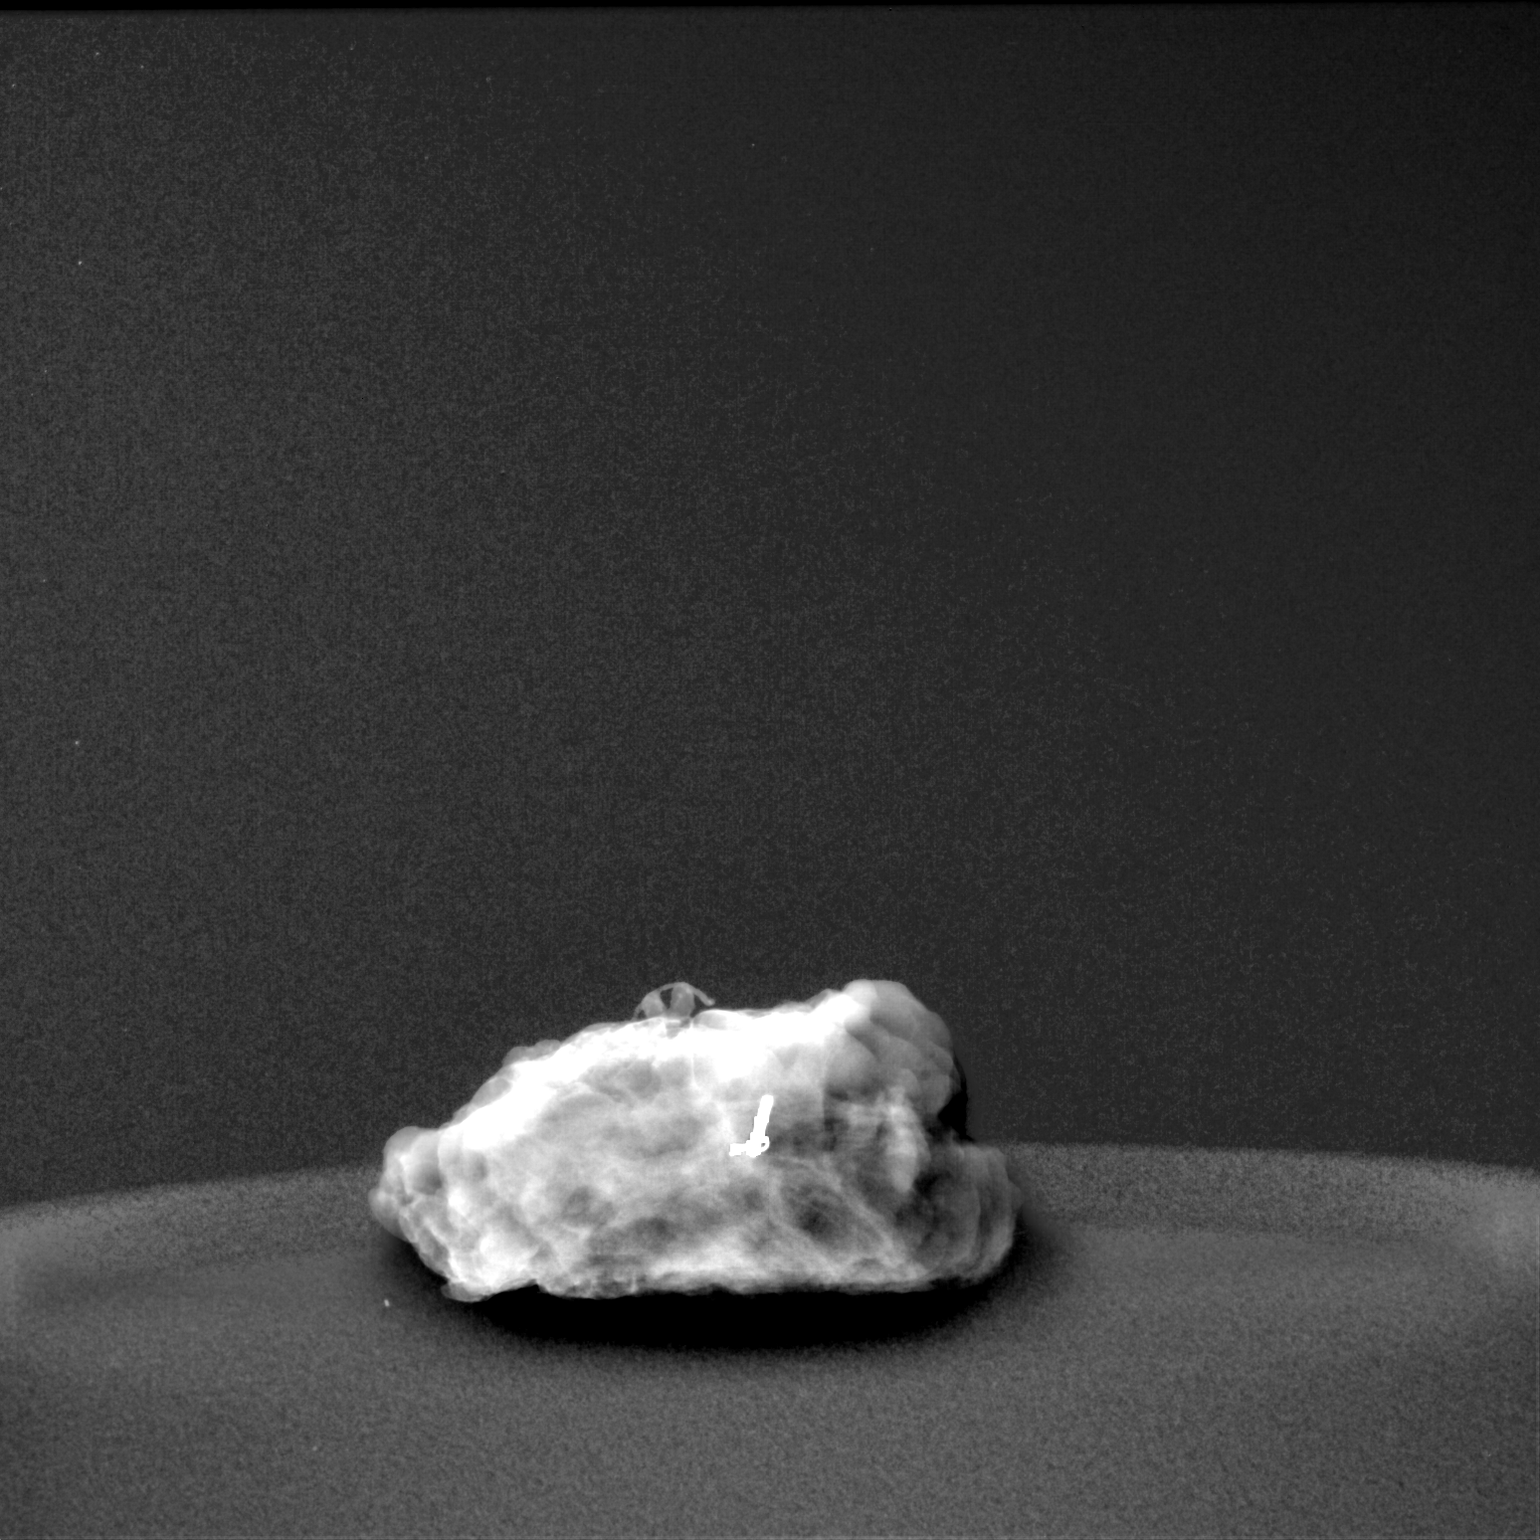
[im 2/2]
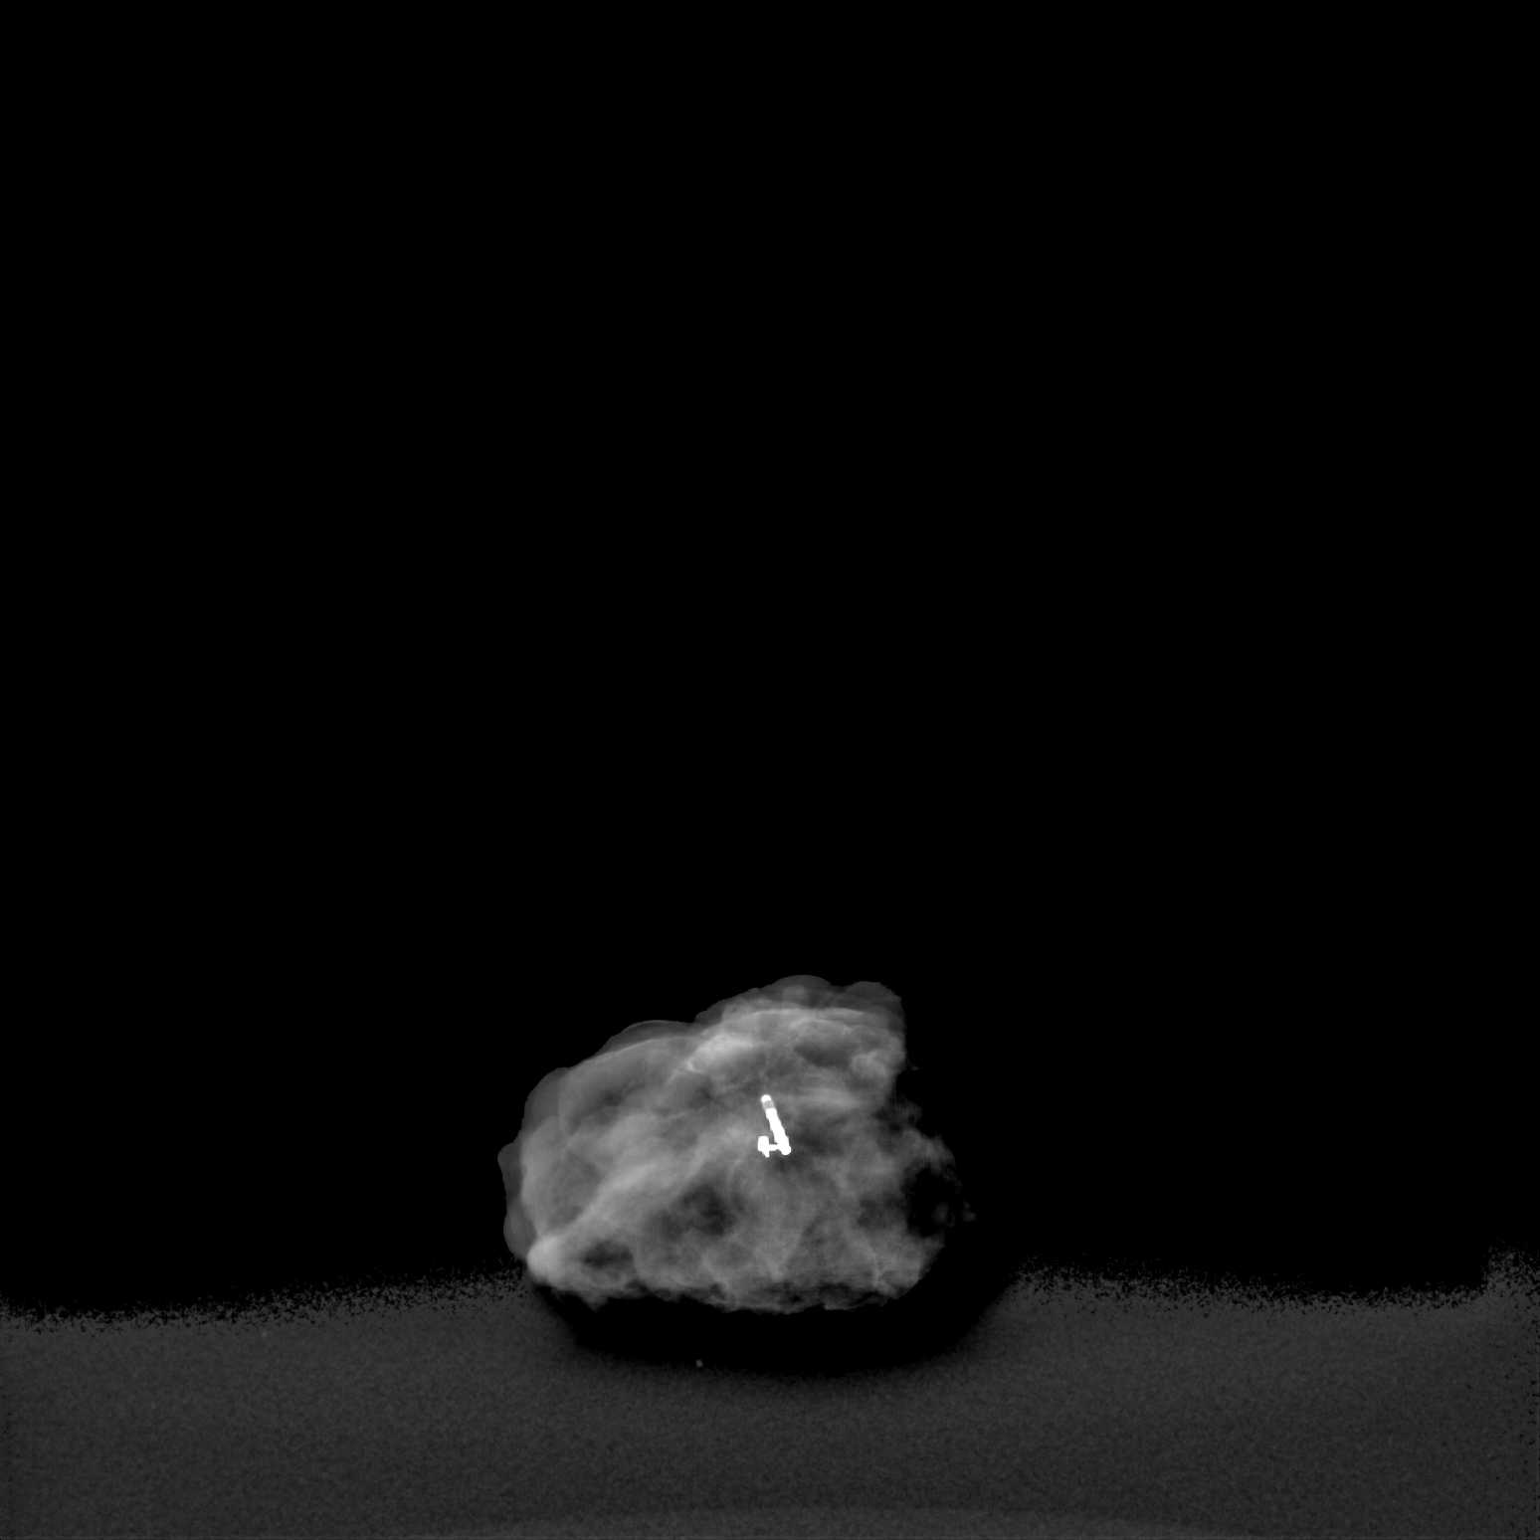

[2 of 2 positions shown; findings below may reference images not displayed]

FINDINGS: Status post excision of the right breast. The radioactive seed and
biopsy marker clip are present, completely intact, and were marked
for pathology.
IMPRESSION: Specimen radiograph of the right breast.

## 2019-01-17 SURGERY — BREAST LUMPECTOMY WITH RADIOACTIVE SEED AND SENTINEL LYMPH NODE BIOPSY
Anesthesia: General | Site: Breast | Laterality: Right

## 2019-01-17 MED ORDER — PROPOFOL 10 MG/ML IV BOLUS
INTRAVENOUS | Status: AC
  Filled 2019-01-17: qty 20

## 2019-01-17 MED ORDER — ONDANSETRON HCL 4 MG/2ML IJ SOLN
INTRAMUSCULAR | Status: DC | PRN
  Administered 2019-01-17: 4 mg via INTRAVENOUS

## 2019-01-17 MED ORDER — PROPOFOL 10 MG/ML IV BOLUS
INTRAVENOUS | Status: DC | PRN
  Administered 2019-01-17: 120 mg via INTRAVENOUS

## 2019-01-17 MED ORDER — MIDAZOLAM HCL 2 MG/2ML IJ SOLN
INTRAMUSCULAR | Status: AC
  Filled 2019-01-17: qty 2

## 2019-01-17 MED ORDER — GABAPENTIN 300 MG PO CAPS
300.0000 mg | ORAL_CAPSULE | ORAL | Status: AC
  Administered 2019-01-17: 13:00:00 300 mg via ORAL

## 2019-01-17 MED ORDER — GABAPENTIN 300 MG PO CAPS
ORAL_CAPSULE | ORAL | Status: AC
  Filled 2019-01-17: qty 1

## 2019-01-17 MED ORDER — METHYLENE BLUE 0.5 % INJ SOLN
INTRAVENOUS | Status: AC
  Filled 2019-01-17: qty 10

## 2019-01-17 MED ORDER — ACETAMINOPHEN 500 MG PO TABS
ORAL_TABLET | ORAL | Status: AC
  Filled 2019-01-17: qty 2

## 2019-01-17 MED ORDER — SODIUM CHLORIDE (PF) 0.9 % IJ SOLN
INTRAMUSCULAR | Status: AC
  Filled 2019-01-17: qty 10

## 2019-01-17 MED ORDER — PROMETHAZINE HCL 25 MG/ML IJ SOLN: 6.2500 mg | INTRAMUSCULAR | Status: DC | PRN

## 2019-01-17 MED ORDER — CELECOXIB 200 MG PO CAPS
ORAL_CAPSULE | ORAL | Status: AC
  Filled 2019-01-17: qty 1

## 2019-01-17 MED ORDER — VANCOMYCIN HCL IN DEXTROSE 1-5 GM/200ML-% IV SOLN
1000.0000 mg | INTRAVENOUS | Status: AC
  Administered 2019-01-17: 1000 mg via INTRAVENOUS

## 2019-01-17 MED ORDER — OXYCODONE HCL 5 MG/5ML PO SOLN: 5.0000 mg | Freq: Once | ORAL | Status: AC | PRN

## 2019-01-17 MED ORDER — FENTANYL CITRATE (PF) 100 MCG/2ML IJ SOLN
50.0000 ug | INTRAMUSCULAR | Status: DC | PRN
  Administered 2019-01-17 (×2): 50 ug via INTRAVENOUS

## 2019-01-17 MED ORDER — FENTANYL CITRATE (PF) 100 MCG/2ML IJ SOLN
INTRAMUSCULAR | Status: DC | PRN
  Administered 2019-01-17: 50 ug via INTRAVENOUS
  Administered 2019-01-17 (×2): 25 ug via INTRAVENOUS

## 2019-01-17 MED ORDER — FENTANYL CITRATE (PF) 100 MCG/2ML IJ SOLN
INTRAMUSCULAR | Status: AC
  Filled 2019-01-17: qty 2

## 2019-01-17 MED ORDER — ACETAMINOPHEN 10 MG/ML IV SOLN: 1000.0000 mg | Freq: Once | INTRAVENOUS | Status: DC | PRN

## 2019-01-17 MED ORDER — ACETAMINOPHEN 500 MG PO TABS
1000.0000 mg | ORAL_TABLET | ORAL | Status: AC
  Administered 2019-01-17: 1000 mg via ORAL

## 2019-01-17 MED ORDER — DEXAMETHASONE SODIUM PHOSPHATE 10 MG/ML IJ SOLN
INTRAMUSCULAR | Status: DC | PRN
  Administered 2019-01-17: 10 mg via INTRAVENOUS

## 2019-01-17 MED ORDER — DEXAMETHASONE SODIUM PHOSPHATE 10 MG/ML IJ SOLN
INTRAMUSCULAR | Status: AC
  Filled 2019-01-17: qty 1

## 2019-01-17 MED ORDER — LIDOCAINE 2% (20 MG/ML) 5 ML SYRINGE
INTRAMUSCULAR | Status: AC
  Filled 2019-01-17: qty 5

## 2019-01-17 MED ORDER — VANCOMYCIN HCL IN DEXTROSE 1-5 GM/200ML-% IV SOLN
INTRAVENOUS | Status: AC
  Filled 2019-01-17: qty 200

## 2019-01-17 MED ORDER — BUPIVACAINE-EPINEPHRINE 0.25% -1:200000 IJ SOLN
INTRAMUSCULAR | Status: DC | PRN
  Administered 2019-01-17: 15 mL

## 2019-01-17 MED ORDER — OXYCODONE HCL 5 MG PO TABS
5.0000 mg | ORAL_TABLET | Freq: Once | ORAL | Status: AC | PRN
  Administered 2019-01-17: 5 mg via ORAL

## 2019-01-17 MED ORDER — OXYCODONE HCL 5 MG PO TABS
ORAL_TABLET | ORAL | Status: AC
  Filled 2019-01-17: qty 1

## 2019-01-17 MED ORDER — LIDOCAINE 2% (20 MG/ML) 5 ML SYRINGE
INTRAMUSCULAR | Status: DC | PRN
  Administered 2019-01-17: 80 mg via INTRAVENOUS

## 2019-01-17 MED ORDER — HYDROCODONE-ACETAMINOPHEN 5-325 MG PO TABS: 1.0000 | ORAL_TABLET | Freq: Four times a day (QID) | ORAL | 0 refills | Status: DC | PRN

## 2019-01-17 MED ORDER — LACTATED RINGERS IV SOLN
INTRAVENOUS | Status: DC
  Administered 2019-01-17 (×2): via INTRAVENOUS

## 2019-01-17 MED ORDER — TECHNETIUM TC 99M SULFUR COLLOID FILTERED
1.0000 | Freq: Once | INTRAVENOUS | Status: AC | PRN
  Administered 2019-01-17: 14:00:00 1 via INTRADERMAL

## 2019-01-17 MED ORDER — BUPIVACAINE-EPINEPHRINE (PF) 0.5% -1:200000 IJ SOLN
INTRAMUSCULAR | Status: DC | PRN
  Administered 2019-01-17: 30 mL via PERINEURAL

## 2019-01-17 MED ORDER — CHLORHEXIDINE GLUCONATE CLOTH 2 % EX PADS: 6.0000 | MEDICATED_PAD | Freq: Once | CUTANEOUS | Status: DC

## 2019-01-17 MED ORDER — MIDAZOLAM HCL 2 MG/2ML IJ SOLN
1.0000 mg | INTRAMUSCULAR | Status: DC | PRN
  Administered 2019-01-17 (×2): 1 mg via INTRAVENOUS

## 2019-01-17 MED ORDER — CELECOXIB 200 MG PO CAPS
200.0000 mg | ORAL_CAPSULE | ORAL | Status: AC
  Administered 2019-01-17: 200 mg via ORAL

## 2019-01-17 MED ORDER — ONDANSETRON HCL 4 MG/2ML IJ SOLN
INTRAMUSCULAR | Status: AC
  Filled 2019-01-17: qty 2

## 2019-01-17 MED ORDER — BUPIVACAINE-EPINEPHRINE (PF) 0.25% -1:200000 IJ SOLN
INTRAMUSCULAR | Status: AC
  Filled 2019-01-17: qty 30

## 2019-01-17 MED ORDER — FENTANYL CITRATE (PF) 100 MCG/2ML IJ SOLN: 25.0000 ug | INTRAMUSCULAR | Status: DC | PRN

## 2019-01-17 SURGICAL SUPPLY — 48 items
APPLIER CLIP 9.375 MED OPEN (MISCELLANEOUS) ×2
BLADE SURG 15 STRL LF DISP TIS (BLADE) ×1 IMPLANT
BLADE SURG 15 STRL SS (BLADE) ×1
CANISTER SUC SOCK COL 7IN (MISCELLANEOUS) IMPLANT
CANISTER SUCT 1200ML W/VALVE (MISCELLANEOUS) ×1 IMPLANT
CHLORAPREP W/TINT 26 (MISCELLANEOUS) ×2 IMPLANT
CLIP APPLIE 9.375 MED OPEN (MISCELLANEOUS) ×1 IMPLANT
COVER BACK TABLE REUSABLE LG (DRAPES) ×2 IMPLANT
COVER MAYO STAND REUSABLE (DRAPES) ×2 IMPLANT
COVER PROBE W GEL 5X96 (DRAPES) ×2 IMPLANT
COVER WAND RF STERILE (DRAPES) IMPLANT
DECANTER SPIKE VIAL GLASS SM (MISCELLANEOUS) IMPLANT
DERMABOND ADVANCED (GAUZE/BANDAGES/DRESSINGS) ×1
DERMABOND ADVANCED .7 DNX12 (GAUZE/BANDAGES/DRESSINGS) ×1 IMPLANT
DRAPE LAPAROSCOPIC ABDOMINAL (DRAPES) ×2 IMPLANT
DRAPE UTILITY XL STRL (DRAPES) ×2 IMPLANT
ELECT COATED BLADE 2.86 ST (ELECTRODE) ×2 IMPLANT
ELECT REM PT RETURN 9FT ADLT (ELECTROSURGICAL) ×2
ELECTRODE REM PT RTRN 9FT ADLT (ELECTROSURGICAL) ×1 IMPLANT
GLOVE BIO SURGEON STRL SZ 6.5 (GLOVE) ×1 IMPLANT
GLOVE BIO SURGEON STRL SZ7.5 (GLOVE) ×2 IMPLANT
GLOVE BIOGEL PI IND STRL 6.5 (GLOVE) IMPLANT
GLOVE BIOGEL PI INDICATOR 6.5 (GLOVE) ×1
GLOVE EXAM NITRILE MD LF STRL (GLOVE) ×1 IMPLANT
GOWN STRL REUS W/ TWL LRG LVL3 (GOWN DISPOSABLE) ×2 IMPLANT
GOWN STRL REUS W/ TWL XL LVL3 (GOWN DISPOSABLE) IMPLANT
GOWN STRL REUS W/TWL LRG LVL3 (GOWN DISPOSABLE) ×1
GOWN STRL REUS W/TWL XL LVL3 (GOWN DISPOSABLE) ×1
ILLUMINATOR WAVEGUIDE N/F (MISCELLANEOUS) IMPLANT
KIT MARKER MARGIN INK (KITS) ×2 IMPLANT
LIGHT WAVEGUIDE WIDE FLAT (MISCELLANEOUS) IMPLANT
NDL HYPO 25X1 1.5 SAFETY (NEEDLE) ×1 IMPLANT
NDL SAFETY ECLIPSE 18X1.5 (NEEDLE) IMPLANT
NEEDLE HYPO 18GX1.5 SHARP (NEEDLE)
NEEDLE HYPO 25X1 1.5 SAFETY (NEEDLE) ×2 IMPLANT
NS IRRIG 1000ML POUR BTL (IV SOLUTION) ×1 IMPLANT
PACK BASIN DAY SURGERY FS (CUSTOM PROCEDURE TRAY) ×2 IMPLANT
PENCIL BUTTON HOLSTER BLD 10FT (ELECTRODE) ×2 IMPLANT
SLEEVE SCD COMPRESS KNEE MED (MISCELLANEOUS) ×2 IMPLANT
SPONGE LAP 18X18 RF (DISPOSABLE) ×2 IMPLANT
SUT MON AB 4-0 PC3 18 (SUTURE) ×4 IMPLANT
SUT SILK 2 0 SH (SUTURE) IMPLANT
SUT VICRYL 3-0 CR8 SH (SUTURE) ×2 IMPLANT
SYR CONTROL 10ML LL (SYRINGE) ×2 IMPLANT
TOWEL GREEN STERILE FF (TOWEL DISPOSABLE) ×2 IMPLANT
TRAY FAXITRON CT DISP (TRAY / TRAY PROCEDURE) ×2 IMPLANT
TUBE CONNECTING 20X1/4 (TUBING) ×1 IMPLANT
YANKAUER SUCT BULB TIP NO VENT (SUCTIONS) ×1 IMPLANT

## 2019-01-17 NOTE — Progress Notes (Signed)
Assisted Dr. Daiva Huge with right, ultrasound guided, pectoralis block. Side rails up, monitors on throughout procedure. See vital signs in flow sheet. Tolerated Procedure well.

## 2019-01-17 NOTE — Interval H&P Note (Signed)
History and Physical Interval Note:  01/17/2019 1:42 PM  Debbie Berry  has presented today for surgery, with the diagnosis of RIGHT BREAST CANCER.  The various methods of treatment have been discussed with the patient and family. After consideration of risks, benefits and other options for treatment, the patient has consented to  Procedure(s): RIGHT BREAST LUMPECTOMY WITH RADIOACTIVE SEED AND SENTINEL LYMPH NODE BIOPSY (Right) as a surgical intervention.  The patient's history has been reviewed, patient examined, no change in status, stable for surgery.  I have reviewed the patient's chart and labs.  Questions were answered to the patient's satisfaction.     Autumn Messing III

## 2019-01-17 NOTE — Anesthesia Procedure Notes (Signed)
Procedure Name: LMA Insertion Date/Time: 01/17/2019 2:10 PM Performed by: Gwyndolyn Saxon, CRNA Pre-anesthesia Checklist: Patient identified, Emergency Drugs available, Suction available and Patient being monitored Patient Re-evaluated:Patient Re-evaluated prior to induction Oxygen Delivery Method: Circle system utilized Preoxygenation: Pre-oxygenation with 100% oxygen Induction Type: IV induction Ventilation: Mask ventilation without difficulty LMA: LMA inserted LMA Size: 3.0 Number of attempts: 1 Placement Confirmation: positive ETCO2 and breath sounds checked- equal and bilateral Tube secured with: Tape Dental Injury: Teeth and Oropharynx as per pre-operative assessment

## 2019-01-17 NOTE — H&P (Signed)
Debbie Berry  Location: [facility] Surgery Patient #: [id number] DOB: 1970-08-02 Undefined / Language: [name] / Race: White Female   History of Present Illness  The patient is a 48 year old female who presents with breast cancer.We are asked to see the patient in consultation by Dr. Lindi Adie to evaluate her for a new right breast cancer. The patient is a 48 year old white female who presents with a distortion in the UIQ of the right breast on screening mammogram. This measured 23m and axilla looked neg. 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 She is otherwise healthy and does not smoke   Past Surgical History  Gallbladder Surgery - Laparoscopic  Ventral / Umbilical Hernia Surgery  Bilateral.  Diagnostic Studies History  Colonoscopy  never Mammogram  within last year Pap Smear  1-5 years ago  Medication History  Medications Reconciled  Social History Alcohol use  Occasional alcohol use. No caffeine use  No drug use  Tobacco use  Never smoker.  Family History Family history unknown  First Degree Relatives   Pregnancy / Birth History  Age at menarche  89years. Contraceptive History  Oral contraceptives. Gravida  2 Maternal age  50-30Para  2 Regular periods   Other Problems  Umbilical Hernia Repair     Review of Systems  General Not Present- Appetite Loss, Chills, Fatigue, Fever, Night Sweats, Weight Gain and Weight Loss. Skin Not Present- Change in Wart/Mole, Dryness, Hives, Jaundice, New Lesions, Non-Healing Wounds, Rash and Ulcer. HEENT Present- Seasonal Allergies. Not Present- Earache, Hearing Loss, Hoarseness, Nose Bleed, Oral Ulcers, Ringing in the Ears, Sinus Pain, Sore Throat, Visual Disturbances, Wears glasses/contact lenses and Yellow Eyes. Respiratory Not Present- Bloody sputum, Chronic Cough, Difficulty Breathing, Snoring and Wheezing. Breast Not Present- Breast Mass, Breast Pain, Nipple Discharge and  Skin Changes. Cardiovascular Not Present- Chest Pain, Difficulty Breathing Lying Down, Leg Cramps, Palpitations, Rapid Heart Rate, Shortness of Breath and Swelling of Extremities. Gastrointestinal Not Present- Abdominal Pain, Bloating, Bloody Stool, Change in Bowel Habits, Chronic diarrhea, Constipation, Difficulty Swallowing, Excessive gas, Gets full quickly at meals, Hemorrhoids, Indigestion, Nausea, Rectal Pain and Vomiting. Female Genitourinary Not Present- Frequency, Nocturia, Painful Urination, Pelvic Pain and Urgency. Musculoskeletal Not Present- Back Pain, Joint Pain, Joint Stiffness, Muscle Pain, Muscle Weakness and Swelling of Extremities. Neurological Not Present- Decreased Memory, Fainting, Headaches, Numbness, Seizures, Tingling, Tremor, Trouble walking and Weakness. Psychiatric Not Present- Anxiety, Bipolar, Change in Sleep Pattern, Depression, Fearful and Frequent crying. Endocrine Not Present- Cold Intolerance, Excessive Hunger, Hair Changes, Heat Intolerance, Hot flashes and New Diabetes. Hematology Not Present- Blood Thinners, Easy Bruising, Excessive bleeding, Gland problems, HIV and Persistent Infections.   Physical Exam  General Mental Status-Alert. General Appearance-Consistent with stated age. Hydration-Well hydrated. Voice-Normal.  Head and Neck Head-normocephalic, atraumatic with no lesions or palpable masses. Trachea-midline. Thyroid Gland Characteristics - normal size and consistency.  Eye Eyeball - Bilateral-Extraocular movements intact. Sclera/Conjunctiva - Bilateral-No scleral icterus.  Chest and Lung Exam Chest and lung exam reveals -quiet, even and easy respiratory effort with no use of accessory muscles and on auscultation, normal breath sounds, no adventitious sounds and normal vocal resonance. Inspection Chest Wall - Normal. Back - normal.  Breast Note: There is no palpable mass in either breast. There is no palpable axillary,  supraclavicular, or cervical lymphadenopathy   Cardiovascular Cardiovascular examination reveals -normal heart sounds, regular rate and rhythm with no murmurs and normal pedal pulses bilaterally.  Abdomen Inspection Inspection of the abdomen reveals - No Hernias. Skin - Scar - no surgical scars. Palpation/Percussion Palpation and Percussion of the abdomen reveal - Soft, Non Tender, No Rebound tenderness, No Rigidity (guarding) and No hepatosplenomegaly. Auscultation Auscultation of the abdomen reveals - Bowel sounds normal.  Neurologic Neurologic evaluation reveals -alert and oriented x 3 with no impairment of recent or remote memory. Mental Status-Normal.  Musculoskeletal Normal Exam - Left-Upper Extremity Strength Normal and Lower Extremity Strength Normal. Normal Exam - Right-Upper Extremity Strength Normal and Lower Extremity Strength Normal.  Lymphatic Head & Neck  General Head & Neck Lymphatics: Bilateral - Description - Normal. Axillary  General Axillary Region: Bilateral - Description - Normal. Tenderness - Non Tender. Femoral & Inguinal  Generalized Femoral & Inguinal Lymphatics: Bilateral - Description - Normal. Tenderness - Non Tender.    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 I have talked to her about the different options for treatment and at this point she favors breast conservation which is a very reasonable way of treating her cancer. she is also a candidate for sentinel node biopsy. I have discussed with her in detail the risks and benefits of the surgery as well as some of the technical aspects including use of seed for localization and she understands and wishes to proceed  Current Plans Referred to Oncology, for evaluation and follow up (Oncology). Routine.

## 2019-01-17 NOTE — Discharge Instructions (Signed)

## 2019-01-17 NOTE — Anesthesia Procedure Notes (Signed)
Anesthesia Regional Block: Pectoralis block   Pre-Anesthetic Checklist: ,, timeout performed, Correct Patient, Correct Site, Correct Laterality, Correct Procedure, Correct Position, site marked, Risks and benefits discussed, pre-op evaluation,  At surgeon's request and post-op pain management  Laterality: Right  Prep: Maximum Sterile Barrier Precautions used, chloraprep       Needles:  Injection technique: Single-shot  Needle Type: Echogenic Stimulator Needle     Needle Length: 9cm  Needle Gauge: 22     Additional Needles:   Procedures:,,,, ultrasound used (permanent image in chart),,,,  Narrative:  Start time: 01/17/2019 1:08 PM End time: 01/17/2019 1:11 PM Injection made incrementally with aspirations every 5 mL.  Performed by: Personally  Anesthesiologist: Brennan Bailey, MD  Additional Notes: Risks, benefits, and alternative discussed. Patient gave consent for procedure. Patient prepped and draped in sterile fashion. Sedation administered, patient remains easily responsive to voice. Relevant anatomy identified with ultrasound guidance. Local anesthetic given in 5cc increments with no signs or symptoms of intravascular injection. No pain or paraesthesias with injection. Patient monitored throughout procedure with signs of LAST or immediate complications. Tolerated well. Ultrasound image placed in chart.  Tawny Asal, MD

## 2019-01-17 NOTE — Op Note (Signed)
01/17/2019  3:18 PM  PATIENT:  Debbie Berry  48 y.o. female  PRE-OPERATIVE DIAGNOSIS:  RIGHT BREAST CANCER  POST-OPERATIVE DIAGNOSIS:  RIGHT BREAST CANCER  PROCEDURE:  Procedure(s): RIGHT BREAST LUMPECTOMY WITH RADIOACTIVE SEED LOCALIZATION AND DEEP RIGHT AXILLARY SENTINEL LYMPH NODE BIOPSY (Right)  SURGEON:  Surgeon(s) and Role:    * Jovita Kussmaul, MD - Primary  PHYSICIAN ASSISTANT:   ASSISTANTS: none   ANESTHESIA:   local and general  EBL:  minimal   BLOOD ADMINISTERED:none  DRAINS: none   LOCAL MEDICATIONS USED:  MARCAINE     SPECIMEN:  Source of Specimen:  right breast tissue with sentinel nodes x 2  DISPOSITION OF SPECIMEN:  PATHOLOGY  COUNTS:  YES  TOURNIQUET:  * No tourniquets in log *  DICTATION: .Dragon Dictation   After informed consent was obtained the patient was brought to the operating room and placed in the supine position on the operating table.  After adequate induction of general anesthesia the patient's right chest, breast, and axillary area were prepped with ChloraPrep, allowed to dry, and draped in usual sterile manner.  An appropriate timeout was performed.  Previously an I-125 seed was placed in the upper inner portion of the right breast to mark an area of invasive breast cancer.  Earlier in the day the patient also underwent injection of 1 mCi of technetium sulfur colloid in the subareolar position on the right.  The neoprobe was set to technetium and an area of radioactivity was readily identified in the right axilla.  This area was infiltrated with quarter percent Marcaine.  A small transversely oriented incision was made with a 15 blade knife overlying the area of radioactivity.  The incision was carried through the skin and subcutaneous tissue sharply with the electrocautery until the deep right axillary space was entered.  The neoprobe was used to direct blunt hemostat dissection.  I was able to identify 2 lymph nodes.  One was palpable and the  other was hot.  Both were excised sharply with the electrocautery and the lymphatics and small vessels surrounding them were controlled with clips.  Ex vivo counts on the hot node were approximately 2000.  No other hot or palpable nodes were identified in the right axilla.  Hemostasis was achieved using the Bovie electrocautery.  The deep layer of the wound was then closed with interrupted 3-0 Vicryl stitches.  The skin was closed with a running 4-0 Monocryl subcuticular stitch.  Attention was then turned to the right breast.  The neoprobe was set to I-125 in the area of radioactivity was readily identified very centrally in the upper inner right breast.  The area around this was infiltrated with quarter percent Marcaine.  A curvilinear incision was made along the upper inner edge of the area Ola with a 15 blade knife.  The incision was carried through the skin and subcutaneous tissue sharply with the electrocautery.  Dissection was then carried out between the breast tissue and the subcutaneous fatty tissue into the upper inner quadrant past the location of the cancer.  Next a circular portion of breast tissue was excised sharply around the radioactive seed while checking the area of radioactivity frequently.  Once the specimen was removed it was oriented with the appropriate paint colors.  A specimen radiograph was obtained that showed the clip and seed to be near the center of the specimen.  The specimen was then sent to pathology for further evaluation.  Hemostasis was achieved again using the  Bovie electrocautery.  The mobilized breast tissue was then able to be brought together with layers of 3-0 Vicryl stitches.  The cavity was marked with clips.  The skin was then closed with interrupted 4-0 Monocryl subcuticular stitches.  Dermabond dressings were applied.  The patient tolerated the procedure well.  At the end of the case all needle sponge and instrument counts were correct.  The patient was then awakened  and taken to recovery in stable condition.  PLAN OF CARE: Discharge to home after PACU  PATIENT DISPOSITION:  PACU - hemodynamically stable.   Delay start of Pharmacological VTE agent (>24hrs) due to surgical blood loss or risk of bleeding: not applicable

## 2019-01-17 NOTE — Transfer of Care (Signed)
Immediate Anesthesia Transfer of Care Note  Patient: Debbie Berry  Procedure(s) Performed: RIGHT BREAST LUMPECTOMY WITH RADIOACTIVE SEED AND SENTINEL LYMPH NODE BIOPSY (Right Breast)  Patient Location: PACU  Anesthesia Type:GA combined with regional for post-op pain  Level of Consciousness: sedated  Airway & Oxygen Therapy: Patient Spontanous Breathing and Patient connected to face mask oxygen  Post-op Assessment: Report given to RN and Post -op Vital signs reviewed and stable  Post vital signs: Reviewed and stable  Last Vitals:  Vitals Value Taken Time  BP    Temp    Pulse 73 01/17/19 1524  Resp    SpO2 100 % 01/17/19 1524  Vitals shown include unvalidated device data.  Last Pain:  Vitals:   01/17/19 1244  TempSrc: Oral  PainSc: 0-No pain         Complications: No apparent anesthesia complications

## 2019-01-17 NOTE — Progress Notes (Signed)
nuc med inj performed by nuc med staff. Pt given additional fentanyl and versed for comfort. Pt tol well. VSS. Emotional support provided.

## 2019-01-17 NOTE — Anesthesia Postprocedure Evaluation (Signed)
Anesthesia Post Note  Patient: Debbie Berry  Procedure(s) Performed: RIGHT BREAST LUMPECTOMY WITH RADIOACTIVE SEED AND SENTINEL LYMPH NODE BIOPSY (Right Breast)     Patient location during evaluation: PACU Anesthesia Type: General Level of consciousness: awake and alert and oriented Pain management: pain level controlled Vital Signs Assessment: post-procedure vital signs reviewed and stable Respiratory status: spontaneous breathing, nonlabored ventilation and respiratory function stable Cardiovascular status: blood pressure returned to baseline Postop Assessment: no apparent nausea or vomiting Anesthetic complications: no    Last Vitals:  Vitals:   01/17/19 1335 01/17/19 1524  BP: 113/76 (P) 98/68  Pulse: 80 (P) 68  Resp: (!) 21 (P) 10  Temp:  (P) 36.5 C  SpO2: 100% 100%    Last Pain:  Vitals:   01/17/19 1524  TempSrc:   PainSc: (P) Asleep                 Brennan Bailey

## 2019-01-18 ENCOUNTER — Encounter (HOSPITAL_BASED_OUTPATIENT_CLINIC_OR_DEPARTMENT_OTHER): Payer: Self-pay | Admitting: General Surgery

## 2019-01-21 NOTE — Assessment & Plan Note (Signed)
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  01/17/19: rt Lumpectomy: 01/17/19:   Pathology counseling: I discussed the final pathology report of the patient provided  a copy of this report. I discussed the margins as well as lymph node surgeries. We also discussed the final staging along with previously performed ER/PR and HER-2/neu testing.  Treatment Plan: 1. Adjuvant radiation therapy 2. Adjuvant antiestrogen therapy  If the final tumor is greater than 10 mm then we will consider sending for Oncotype DX. Patient works as an aOptometrist

## 2019-01-23 ENCOUNTER — Telehealth: Payer: Self-pay | Admitting: *Deleted

## 2019-01-23 NOTE — Telephone Encounter (Signed)
Received order for oncotype testing order. Requisition faxed to pathology and [facility]. Received by Varney Biles.

## 2019-01-25 ENCOUNTER — Telehealth: Payer: Self-pay | Admitting: Hematology and Oncology

## 2019-01-25 NOTE — Telephone Encounter (Signed)
Contacted patient to verify webex visit for pre reg °

## 2019-01-25 NOTE — Telephone Encounter (Signed)
Called patient regarding upcoming virtual call, informed patient how Doximity works and patient is suggesting this be a telephone visit if the virtual visit does not work out.

## 2019-01-27 NOTE — Progress Notes (Signed)
HEMATOLOGY-ONCOLOGY DOXIMITY VISIT PROGRESS NOTE  I connected with Cariah JEANITA CARNEIRO on 01/28/2019 at 10:00 AM EDT by Doximity video conference and verified that I am speaking with the correct person using two identifiers.  I discussed the limitations, risks, security and privacy concerns of performing an evaluation and management service by Doximity and the availability of in person appointments.  I also discussed with the patient that there may be a patient responsible charge related to this service. The patient expressed understanding and agreed to proceed.  Patient's Location: Home Physician Location: Clinic  CHIEF COMPLIANT: Follow-up s/p lumpectomy to review pathology and discuss further treatment  INTERVAL HISTORY: Debbie Berry is a 48 y.o. female with above-mentioned history of right breast cancer. Genetic testing was negative. She underwent a lumpectomy on 01/17/19 with Dr. 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 She presents over Doximity today to discuss the pathology report and further treatment.   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    12/19/2018 Cancer Staging   Staging form: Breast, AJCC 8th Edition - Clinical stage from 12/19/2018: Stage IA (cT1b, cN0, cM0, G1, ER+, PR+, HER2-) - Signed by GNicholas Lose MD on 12/19/2018   01/02/2019 Genetic Testing   Negative genetic testing on the [facility] Breast Cancer STAT panel. The STAT Breast cancer panel offered by [facility] includes sequencing and rearrangement analysis for the following 9 genes:  ATM, BRCA1, BRCA2, CDH1, CHEK2, PALB2,  PTEN, STK11 and TP53. The report date is 01/02/2019.   01/17/2019 Surgery   Right lumpectomy (Marlou Starks: IDC with DCIS, 1.2cm, grade 1, clear margins, 2 axillary lymph nodes negative.     REVIEW OF SYSTEMS:   Constitutional: Denies fevers, chills or abnormal weight loss Eyes: Denies blurriness of vision Ears, nose, mouth, throat, and face: Denies mucositis or sore throat Respiratory: Denies cough, dyspnea or wheezes Cardiovascular: Denies palpitation, chest discomfort Gastrointestinal:  Denies nausea, heartburn or change in bowel habits Skin: Denies abnormal skin rashes Lymphatics: Denies new lymphadenopathy or easy bruising Neurological:Denies numbness, tingling or new weaknesses Behavioral/Psych: Mood is stable, no new changes  Extremities: No lower extremity edema Breast: denies any pain or lumps or nodules in either breasts All other systems were reviewed with the patient and are negative.  Observations/Objective:  There were no vitals filed for this visit. There is no height or weight on file to calculate BMI.  I have reviewed the data as listed CMP Latest Ref Rng & Units 12/19/2018  Glucose 70 - 99 mg/dL 93  BUN 6 - 20 mg/dL 10  Creatinine 0.44 - 1.00 mg/dL 0.80  Sodium 135 - 145 mmol/L 139  Potassium 3.5 - 5.1 mmol/L 3.6  Chloride 98 - 111 mmol/L 106  CO2 22 - 32 mmol/L 23  Calcium 8.9 - 10.3 mg/dL 8.9  Total Protein 6.5 - 8.1 g/dL 7.3  Total Bilirubin 0.3 - 1.2 mg/dL 0.4  Alkaline Phos 38 - 126 U/L 34(L)  AST 15 - 41 U/L 20  ALT 0 - 44 U/L 32    Lab Results  Component Value Date   WBC 6.4 12/19/2018   HGB 14.6 12/19/2018  HCT 44.8 12/19/2018   MCV 88.2 12/19/2018   PLT 256 12/19/2018   NEUTROABS 4.4 12/19/2018    Assessment Plan:  Malignant neoplasm of upper-inner quadrant of right breast in female, estrogen receptor positive ([name]) 12/12/2018:Screening mammogram detected a distortion in the right breast, measuring 19m on diagnostic mammogram and [place] Biopsy  confirmed grade 1 IDC with DCIS, HER-2 negative by FISH, ER 90%, PR 90%, Ki67 10%.  01/17/19: rt Lumpectomy: 01/17/19:   Pathology counseling: I discussed the final pathology report of the patient provided  a copy of this report. I discussed the margins as well as lymph node surgeries. We also discussed the final staging along with previously performed ER/PR and HER-2/neu testing.  Treatment Plan: 1.  Oncotype DX testing to determine if she would benefit from chemo 2. Adjuvant radiation therapy 3. Adjuvant antiestrogen therapy Because she was on birth control and she was not having any.  She is not sure whether she is menopausal or not.  When she comes to see me after radiation [facility] that and then decide on the antiestrogen treatment.  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  I discussed the assessment and treatment plan with the patient. The patient was provided an opportunity to ask questions and all were answered. The patient agreed with the plan and demonstrated an understanding of the instructions. The patient was advised to call back or seek an in-person evaluation if the symptoms worsen or if the condition fails to improve as anticipated.   I provided 15 minutes of face-to-face Doximity time during this encounter.    VRulon Eisenmenger MD 01/28/2019   I, Molly Dorshimer, am acting as scribe for VNicholas Lose MD.  I have reviewed the above documentation for accuracy and completeness, and I agree with the above.

## 2019-01-28 ENCOUNTER — Inpatient Hospital Stay: Payer: BC Managed Care – PPO | Attending: Hematology and Oncology | Admitting: Hematology and Oncology

## 2019-01-28 DIAGNOSIS — Z170 Estrogen receptor positive status [ER+]: Secondary | ICD-10-CM

## 2019-01-28 DIAGNOSIS — C50211 Malignant neoplasm of upper-inner quadrant of right female breast: Secondary | ICD-10-CM

## 2019-01-31 ENCOUNTER — Telehealth: Payer: Self-pay | Admitting: *Deleted

## 2019-01-31 NOTE — Telephone Encounter (Signed)
Received oncotype results of 24/10%.  She is aware.  She may want xrt in .  I have sent a message to Dr. Isidore Moos with some questions she has before making her decision.

## 2019-02-01 ENCOUNTER — Other Ambulatory Visit: Payer: Self-pay | Admitting: *Deleted

## 2019-02-01 DIAGNOSIS — C50211 Malignant neoplasm of upper-inner quadrant of right female breast: Secondary | ICD-10-CM

## 2019-02-05 ENCOUNTER — Encounter (HOSPITAL_COMMUNITY): Payer: Self-pay | Admitting: Hematology and Oncology

## 2019-02-05 ENCOUNTER — Telehealth: Payer: Self-pay | Admitting: Hematology and Oncology

## 2019-02-05 NOTE — Progress Notes (Signed)
Radiation Oncology         (336) [phone] ________________________________  Name: Debbie Berry MRN: [id number]  Date: 02/06/2019  DOB: [birth year]  Follow-Up Visit Note  Outpatient by WebEx due to pandemic precauations  CC: Spry, Marsh Dolly., MD  Nicholas Lose, MD  Diagnosis:      ICD-10-CM   1. Malignant neoplasm of upper-inner quadrant of right breast in female, estrogen receptor positive ([place])  C50.211    Z17.0      Cancer Staging Malignant neoplasm of upper-inner quadrant of right breast in female, estrogen receptor positive ([place]) Staging form: Breast, AJCC 8th Edition - Clinical stage from 12/19/2018: Stage IA (cT1b, cN0, cM0, G1, ER+, PR+, HER2-) - Signed by Nicholas Lose, MD on 12/19/2018   CHIEF COMPLAINT: Here to discuss management of right breast cancer  Narrative:  The patient returns today for follow-up to discuss radiation treatment options. She was seen in the multidisciplinary breast clinic on 12/19/2018.     Since consultation date, she underwent genetics testing on 12/25/2018, which showed negative results.    She opted to proceed with right breast lumpectomy with sentinel lymph node biopsy on date of 01/17/2019 with pathology report revealing: tumor size of 1.2 cm; histology of invasive ductal carcinoma; margin status to invasive disease of greater than 5 mm, and margin status to in situ disease of greater than 8 mm; nodal status of negative (0/2); Grade 1. Prognostic panel was not repeated. Per initial biopsy: ER+/ PR+ /Her2-.  Oncotype DX was obtained on the final surgical sample and the recurrence score of 24 predicts a risk of recurrence outside the breast over the next 9 years of 10%, if the patient's only systemic therapy is an antiestrogen for 5 years.  It also predicts no significant benefit from chemotherapy.  Symptomatically, the patient reports:  Doing well. Good arm mobility, no lymphedema.         ALLERGIES:  is allergic to nsaids and  penicillins.  Meds: Current Outpatient Medications  Medication Sig Dispense Refill   calcium carbonate (OSCAL) 1500 (600 Ca) MG TABS tablet Take by mouth 2 (two) times daily with a meal. Calcium caltrate     cholecalciferol (VITAMIN D3) 25 MCG (1000 UT) tablet Take 1,000 Units by mouth daily. 2000 IU     COLLAGEN PO Take by mouth.     Cyanocobalamin (VITAMIN B 12 PO) Take by mouth.     Omega-3 Fatty Acids (FISH OIL) 1200 MG CAPS Take by mouth.     HYDROcodone-acetaminophen (NORCO/VICODIN) 5-325 MG tablet Take 1-2 tablets by mouth every 6 (six) hours as needed for moderate pain or severe pain. (Patient not taking: Reported on 02/06/2019) 15 tablet 0   No current facility-administered medications for this encounter.     Physical Findings:  vitals were not taken for this visit. Marland Kitchen    NA  Lab Findings: Lab Results  Component Value Date   WBC 6.4 12/19/2018   HGB 14.6 12/19/2018   HCT 44.8 12/19/2018   MCV 88.2 12/19/2018   PLT 256 12/19/2018      Radiographic Findings: Nm Sentinel Node Inj-no Rpt (breast)  Result Date: 01/17/2019 Sulfur colloid was injected by the nuclear medicine technologist for melanoma sentinel node.   Mm Breast Surgical Specimen  Result Date: 01/17/2019 CLINICAL DATA:  Status post surgical excision of a right breast lesion following radioactive seed localization. EXAM: SPECIMEN RADIOGRAPH OF THE RIGHT BREAST COMPARISON:  Previous exam(s). FINDINGS: Status post excision of the right breast. The radioactive  seed and biopsy marker clip are present, completely intact, and were marked for pathology. IMPRESSION: Specimen radiograph of the right breast. Electronically Signed   By: Lajean Manes M.D.   On: 01/17/2019 15:00   Mm Rt Radioactive Seed Loc Mammo Guide  Result Date: 01/16/2019 CLINICAL DATA:  Patient for preoperative localization prior to right breast lumpectomy. EXAM: MAMMOGRAPHIC GUIDED RADIOACTIVE SEED LOCALIZATION OF THE RIGHT BREAST COMPARISON:   Previous exam(s). FINDINGS: Patient presents for radioactive seed localization prior to right breast lumpectomy. I met with the patient and we discussed the procedure of seed localization including benefits and alternatives. We discussed the high likelihood of a successful procedure. We discussed the risks of the procedure including infection, bleeding, tissue injury and further surgery. We discussed the low dose of radioactivity involved in the procedure. Informed, written consent was given. The usual time-out protocol was performed immediately prior to the procedure. Using mammographic guidance, sterile technique, 1% lidocaine and an I-125 radioactive seed, mass and ribbon shaped marking clip was localized using a cranial approach. The follow-up mammogram images confirm the seed in the expected location and were marked for Dr. Marlou Starks. Follow-up survey of the patient confirms presence of the radioactive seed. Order number of I-125 seed:  [id number]. Total activity:  4.098 millicuries reference Date: 01/16/2019 The patient tolerated the procedure well and was released from the [facility]. She was given instructions regarding seed removal. IMPRESSION: Radioactive seed localization right breast. No apparent complications. Electronically Signed   By: Lovey Newcomer M.D.   On: 01/16/2019 16:26    Impression/Plan: Stage IA (pT1c, pN0) Right Breast UIQ IDC with DCIS, ER+ Ysidro Evert /Her2-  We discussed adjuvant radiotherapy today.  I recommend 4 weeks directed at the right breast in order to reduce risk of locoregional recurrence by 2/3.  The risks, benefits and side effects of this treatment were discussed in detail.  She understands that radiotherapy is associated with skin irritation and fatigue in the acute setting. Late effects can include cosmetic changes and rare injury to internal organs.  She is enthusiastic about proceeding with treatment.    Simulate RT in near future. Will order pregnancy test. A total of 3  medically necessary complex treatment devices will be fabricated and supervised by me: 2 fields with MLCs for custom blocks to protect heart, and lungs;  and, a Vac-lok. MORE COMPLEX DEVICES MAY BE MADE IN DOSIMETRY FOR FIELD IN FIELD BEAMS FOR DOSE HOMOGENEITY.  I have requested : 3D Simulation which is medically necessary to give adequate dose to at risk tissues while sparing lungs and heart.  I have requested a DVH of the following structures: lungs, heart, right lumpectomy cavity.    The patient will receive 40.05 Gy in 15 fractions to the right breast with 2 fields.  This will be  followed by a boost.  This encounter was provided by telemedicine platform Webex.  The patient has given verbal consent for this type of encounter and has been advised to only accept a meeting of this type in a secure network environment. The time spent during this encounter was at least 15 minutes. The attendants for this meeting include Eppie Gibson  and Micaylah M Harkless.  During the encounter, Eppie Gibson was located at [facility] Radiation Oncology Department.  Leshae AALIVIA MCGRAW was located at home.   _____________________________________   Eppie Gibson, MD   This document serves as a record of services personally performed by Eppie Gibson, MD. It was created on  her behalf by Wilburn Mylar, a trained medical scribe. The creation of this record is based on the scribe's personal observations and the provider's statements to them. This document has been checked and approved by the attending provider.

## 2019-02-05 NOTE — Telephone Encounter (Signed)
I left a voicemail for the patient to describe the Oncotype results. With a score of 24 and her age being almost 26, we should use a cutoff of 25 for risk stratification for the Oncotype.  I do not believe she needs chemotherapy. If however she would like to discuss this further I am happy to see her in person.  I asked her to call us back to get an appointment to do so.

## 2019-02-05 NOTE — Progress Notes (Signed)
Location of Breast Cancer: Right Breast  Histology per Pathology Report:  12/10/18 Diagnosis Breast, right, needle core biopsy, upper inner quadrant, 1 o'clock, 2cmfn - INVASIVE DUCTAL CARCINOMA, SEE COMMENT. - DUCTAL CARCINOMA IN SITU.  Receptor Status: ER(90%), PR (90%), Her2-neu (NEG), Ki-(10%)  01/17/19 Diagnosis 1. Lymph node, sentinel, biopsy, right axillary #1 - ONE BENIGN LYMPH NODE (0/1). 2. Lymph node, sentinel, biopsy, right axillary #2 - ONE BENIGN LYMPH NODE (0/1). 3. Breast, lumpectomy, right breast with radioactive seed - INVASIVE DUCTAL CARCINOMA, 1.2 CM, GRADE I. - DUCTAL CARCINOMA IN SITU. - MARGINS NOT INVOLVED. - PREVIOUS BIOPSY SITE AND BIOPSY CLIP  Did patient present with symptoms or was this found on screening mammography?: It was found on a screening mammogram.   Past/Anticipated interventions by surgeon, if any: 01/17/19 PROCEDURE:  Procedure(s): RIGHT BREAST LUMPECTOMY WITH RADIOACTIVE SEED LOCALIZATION AND DEEP RIGHT AXILLARY SENTINEL LYMPH NODE BIOPSY (Right) SURGEON:  Surgeon(s) and Role:    Jovita Kussmaul, MD - Primary  Past/Anticipated interventions by medical oncology, if any: 01/28/19 Dr. Lindi Adie Treatment Plan: 1.  Oncotype DX testing to determine if she would benefit from chemo 2. Adjuvant radiation therapy 3.Adjuvant antiestrogen therapy Because she was on birth control and she was not having any.  She is not sure whether she is menopausal or not.  When she comes to see me after radiation we will perform [facility] and estradiol to determine that and then decide on the antiestrogen treatment.  Patient works as an Optometrist. Return to clinic after radiation therapy/ based on Oncotype DX result  Lymphedema issues, if any: She denies. She reports good arm mobility.    Pain issues, if any: She does report some pain when putting her arm down.    SAFETY ISSUES:  Prior radiation? No  Pacemaker/ICD? No  Possible current pregnancy? She  denies.   Is the patient on methotrexate? No  Current Complaints / other details:      Yan Okray, Stephani Police, RN 02/05/2019,7:57 AM

## 2019-02-06 ENCOUNTER — Other Ambulatory Visit: Payer: Self-pay

## 2019-02-06 ENCOUNTER — Ambulatory Visit
Admission: RE | Admit: 2019-02-06 | Discharge: 2019-02-06 | Disposition: A | Discharge status: Home / Self Care | Payer: BC Managed Care – PPO | Source: Ambulatory Visit | Attending: Radiation Oncology | Admitting: Radiation Oncology

## 2019-02-06 ENCOUNTER — Encounter: Payer: Self-pay | Admitting: Radiation Oncology

## 2019-02-06 DIAGNOSIS — Z170 Estrogen receptor positive status [ER+]: Secondary | ICD-10-CM

## 2019-02-06 DIAGNOSIS — C50211 Malignant neoplasm of upper-inner quadrant of right female breast: Secondary | ICD-10-CM

## 2019-02-07 ENCOUNTER — Telehealth: Payer: Self-pay | Admitting: *Deleted

## 2019-02-07 ENCOUNTER — Encounter: Payer: Self-pay | Admitting: Radiation Oncology

## 2019-02-07 ENCOUNTER — Other Ambulatory Visit: Payer: Self-pay | Admitting: Radiation Oncology

## 2019-02-07 DIAGNOSIS — C50211 Malignant neoplasm of upper-inner quadrant of right female breast: Secondary | ICD-10-CM

## 2019-02-07 NOTE — Telephone Encounter (Signed)
CALLED PATIENT TO INFORM OF LAB ON 02-08-19 @ 2:30 PM AND HER SIM ON 02-08-19 @ 3 PM, SPOKE WITH PATIENT AND SHE IS AWARE OF THESE APPTS.

## 2019-02-08 ENCOUNTER — Ambulatory Visit
Admission: RE | Admit: 2019-02-08 | Discharge: 2019-02-08 | Disposition: A | Discharge status: Home / Self Care | Payer: BC Managed Care – PPO | Source: Ambulatory Visit | Attending: Radiation Oncology | Admitting: Radiation Oncology

## 2019-02-08 ENCOUNTER — Ambulatory Visit: Payer: BC Managed Care – PPO

## 2019-02-08 ENCOUNTER — Other Ambulatory Visit: Payer: Self-pay

## 2019-02-08 DIAGNOSIS — Z170 Estrogen receptor positive status [ER+]: Secondary | ICD-10-CM

## 2019-02-08 DIAGNOSIS — Z510 Encounter for antineoplastic radiation therapy: Secondary | ICD-10-CM | POA: Insufficient documentation

## 2019-02-08 DIAGNOSIS — C50211 Malignant neoplasm of upper-inner quadrant of right female breast: Secondary | ICD-10-CM | POA: Insufficient documentation

## 2019-02-08 LAB — PREGNANCY, URINE: Preg Test, Ur: NEGATIVE

## 2019-02-11 NOTE — Progress Notes (Signed)
  Radiation Oncology         (336) [phone] ________________________________  Name: Debbie Berry MRN: [id number]  Date: 02/08/2019  DOB: 1970-09-20  SIMULATION AND TREATMENT PLANNING NOTE    Outpatient  DIAGNOSIS:     ICD-10-CM   1. Malignant neoplasm of upper-inner quadrant of right breast in female, estrogen receptor positive ([place])  C50.211    Z17.0     NARRATIVE:  The patient was brought to the [facility].  Identity was confirmed.  All relevant records and images related to the planned course of therapy were reviewed.  The patient freely provided informed written consent to proceed with treatment after reviewing the details related to the planned course of therapy. The consent form was witnessed and verified by the simulation staff.    Then, the patient was set-up in a stable reproducible supine position for radiation therapy with her ipsilateral arm over her head, and her upper body secured in a custom-made Vac-lok device.  CT images were obtained.  Surface markings were placed.  The CT images were loaded into the planning software.    TREATMENT PLANNING NOTE: Treatment planning then occurred.  The radiation prescription was entered and confirmed.     A total of 3 medically necessary complex treatment devices were fabricated and supervised by me: 2 fields with MLCs for custom blocks to protect heart, and lungs;  and, a Vac-lok. MORE COMPLEX DEVICES MAY BE MADE IN DOSIMETRY FOR FIELD IN FIELD BEAMS FOR DOSE HOMOGENEITY.  I have requested : 3D Simulation which is medically necessary to give adequate dose to at risk tissues while sparing lungs and heart.  I have requested a DVH of the following structures: lungs, heart, right lumpectomy cavity.    The patient will receive 40.05 Gy in 15 fractions to the right breast with 2 tangential fields.  This will be followed by a boost.  Optical Surface Tracking Plan:  Since intensity modulated radiotherapy (IMRT) and 3D conformal  radiation treatment methods are predicated on accurate and precise positioning for treatment, intrafraction motion monitoring is medically necessary to ensure accurate and safe treatment delivery. The ability to quantify intrafraction motion without excessive ionizing radiation dose can only be performed with optical surface tracking. Accordingly, surface imaging offers the opportunity to obtain 3D measurements of patient position throughout IMRT and 3D treatments without excessive radiation exposure. I am ordering optical surface tracking for this patient's upcoming course of radiotherapy.  ________________________________   Reference:  Ursula Alert, J, et al. Surface imaging-based analysis of intrafraction motion for breast radiotherapy patients.Journal of [employer], n. 6, nov. 2014. ISSN [id number].  Available at: <http://www.jacmp.org/index.php/jacmp/article/view/4957>.    -----------------------------------  Eppie Gibson, MD

## 2019-02-13 DIAGNOSIS — C50211 Malignant neoplasm of upper-inner quadrant of right female breast: Secondary | ICD-10-CM | POA: Diagnosis not present

## 2019-02-14 ENCOUNTER — Other Ambulatory Visit: Payer: Self-pay

## 2019-02-14 ENCOUNTER — Telehealth: Payer: Self-pay | Admitting: Hematology and Oncology

## 2019-02-14 ENCOUNTER — Ambulatory Visit
Admission: RE | Admit: 2019-02-14 | Discharge: 2019-02-14 | Disposition: A | Discharge status: Home / Self Care | Payer: BC Managed Care – PPO | Source: Ambulatory Visit | Attending: Radiation Oncology | Admitting: Radiation Oncology

## 2019-02-14 ENCOUNTER — Ambulatory Visit: Payer: BC Managed Care – PPO | Attending: General Surgery | Admitting: Physical Therapy

## 2019-02-14 ENCOUNTER — Encounter: Payer: Self-pay | Admitting: Physical Therapy

## 2019-02-14 DIAGNOSIS — C50211 Malignant neoplasm of upper-inner quadrant of right female breast: Secondary | ICD-10-CM | POA: Insufficient documentation

## 2019-02-14 DIAGNOSIS — Z170 Estrogen receptor positive status [ER+]: Secondary | ICD-10-CM | POA: Insufficient documentation

## 2019-02-14 DIAGNOSIS — R293 Abnormal posture: Secondary | ICD-10-CM | POA: Diagnosis present

## 2019-02-14 DIAGNOSIS — Z483 Aftercare following surgery for neoplasm: Secondary | ICD-10-CM | POA: Diagnosis present

## 2019-02-14 NOTE — Patient Instructions (Signed)
Closed Chain: Shoulder Abduction / Adduction - on Wall    One hand on wall, step to side and return. Stepping causes shoulder to abduct and adduct. Step __5_ times, each side, __2_ times per day.  http://ss.exer.us/267   Copyright  VHI. All rights reserved.  Closed Chain: Shoulder Flexion / Extension - on Wall    Hands on wall, step backward. Return. Stepping causes shoulder flexion and extension Do _5__ times, each foot, _2__ times per day.  http://ss.exer.us/265   Copyright  VHI. All rights reserved.

## 2019-02-14 NOTE — Telephone Encounter (Signed)
Scheduled appt per 8/27 sch message - pt is aware of apt date and time

## 2019-02-14 NOTE — Therapy (Signed)
[place], [place], [address] Phone: [phone]   Fax:  [phone]  Physical Therapy Treatment  Patient Details  Name: KRISANN MCKENNA MRN: [id number] Date of Birth: 11/25/1970 Referring Provider (PT): Dr. Autumn Messing   Encounter Date: 02/14/2019  PT End of Session - 02/14/19 1138    Visit Number  2    Number of Visits  2    PT Start Time  1102    PT Stop Time  1138    PT Time Calculation (min)  36 min    Activity Tolerance  Patient tolerated treatment well    Behavior During Therapy  [facility] for tasks assessed/performed       Past Medical History:  Diagnosis Date  . Breast cancer ([place])   . Family history of cervical cancer   . Migraine   . Vertigo     Past Surgical History:  Procedure Laterality Date  . BREAST LUMPECTOMY WITH RADIOACTIVE SEED AND SENTINEL LYMPH NODE BIOPSY Right 01/17/2019   Procedure: RIGHT BREAST LUMPECTOMY WITH RADIOACTIVE SEED AND SENTINEL LYMPH NODE BIOPSY;  Surgeon: Jovita Kussmaul, MD;  Location: [facility];  Service: General;  Laterality: Right;  . CHOLECYSTECTOMY    . HERNIA REPAIR      There were no vitals filed for this visit.  Subjective Assessment - 02/14/19 1108    Subjective  Patient underwent a right lumpectomy and sentinel node biopsy on 01/17/2019 with 0/2 nodes positive. Oncotype was 24 so no chemo needed. Radiation begins today.    Pertinent History  Paitent was diagnosed on 12/04/2018 with right invasive ductal carcinoma breast cancer. Patient underwent a right lumpectomy and sentinel node biopsy on 01/17/2019 with 0/2 nodes positive. It is ER/PR positive and HER2 negative with a Ki67 of 10%.    Patient Stated Goals  See if my arm is ok    Currently in Pain?  Yes    Pain Score  4     Pain Location  Arm    Pain Orientation  Anterior;Mid    Pain Descriptors / Indicators  Tightness    Pain Type  Surgical pain    Pain Onset  1 to 4 weeks ago    Pain  Frequency  Intermittent    Aggravating Factors   Leaving arm down by side    Pain Relieving Factors  Nothing    Multiple Pain Sites  No         [facility] PT Assessment - 02/14/19 0001      Assessment   Medical Diagnosis  s/p right lumpectomy    Referring Provider (PT)  Dr. Autumn Messing    Onset Date/Surgical Date  01/17/19    Hand Dominance  Right    Prior Therapy  Baselines      Precautions   Precautions  Other (comment)    Precaution Comments  recent surgery      Restrictions   Weight Bearing Restrictions  No      Balance Screen   Has the patient fallen in the past 6 months  No    Has the patient had a decrease in activity level because of a fear of falling?   No    Is the patient reluctant to leave their home because of a fear of falling?   No      Home Environment   Living Environment  Private residence    Living Arrangements  Spouse/significant other;Children   Husband and 50  and 78 y.o. girls   Available Help at Discharge  Family      Prior Function   Level of Independence  Independent    Vocation  Full time employment    Herbalist    Leisure  Walking 20-30 min/day; doing some arm free weights      Cognition   Overall Cognitive Status  Within Functional Limits for tasks assessed      Observation/Other Assessments   Observations  Incisions appear to be healing well; glue still present. Assessed for axillary and medial arm cording but none noted. No neural tension in right arm.       Posture/Postural Control   Posture/Postural Control  Postural limitations    Postural Limitations  Rounded Shoulders;Forward head      ROM / Strength   AROM / PROM / Strength  AROM      AROM   AROM Assessment Site  Shoulder    Right/Left Shoulder  Right    Right Shoulder Extension  45 Degrees    Right Shoulder Flexion  142 Degrees    Right Shoulder ABduction  164 Degrees    Right Shoulder Internal Rotation  64 Degrees    Right Shoulder External Rotation   84 Degrees        LYMPHEDEMA/ONCOLOGY QUESTIONNAIRE - 02/14/19 1114      Type   Cancer Type  Right breast cancer      Surgeries   Lumpectomy Date  01/17/19    Sentinel Lymph Node Biopsy Date  01/17/19    Number Lymph Nodes Removed  2      Treatment   Active Chemotherapy Treatment  No    Past Chemotherapy Treatment  No    Active Radiation Treatment  Yes    Date  02/14/19    Body Site  right breast    Past Radiation Treatment  No    Current Hormone Treatment  No    Past Hormone Therapy  No      What other symptoms do you have   Are you Having Heaviness or Tightness  No    Are you having Pain  Yes    Are you having pitting edema  No    Is it Hard or Difficult finding clothes that fit  No    Do you have infections  No    Is there Decreased scar mobility  Yes    Stemmer Sign  No      Lymphedema Assessments   Lymphedema Assessments  Upper extremities      Right Upper Extremity Lymphedema   10 cm Proximal to Olecranon Process  26.3 cm    Olecranon Process  24.7 cm    10 cm Proximal to Ulnar Styloid Process  21.9 cm    Just Proximal to Ulnar Styloid Process  15.5 cm    Across Hand at [employer]  18.9 cm    At [place] of 2nd Digit  5.6 cm      Left Upper Extremity Lymphedema   10 cm Proximal to Olecranon Process  28 cm    Olecranon Process  24.3 cm    10 cm Proximal to Ulnar Styloid Process  21.5 cm    Just Proximal to Ulnar Styloid Process  15.3 cm    Across Hand at [employer]  18 cm    At [place] of 2nd Digit  5.4 cm        Quick Dash - 02/14/19 0001  Open a tight or new jar  Mild difficulty    Do heavy household chores (wash walls, wash floors)  No difficulty    Carry a shopping bag or briefcase  No difficulty    Wash your back  No difficulty    Use a knife to cut food  No difficulty    Recreational activities in which you take some force or impact through your arm, shoulder, or hand (golf, hammering, tennis)  Mild difficulty    During the past week, to  what extent has your arm, shoulder or hand problem interfered with your normal social activities with family, friends, neighbors, or groups?  Slightly    During the past week, to what extent has your arm, shoulder or hand problem limited your work or other regular daily activities  Not at all    Arm, shoulder, or hand pain.  Mild    Tingling (pins and needles) in your arm, shoulder, or hand  None    Difficulty Sleeping  Moderate difficulty    DASH Score  13.64 %                     PT Education - 02/14/19 1137    Education Details  Shoulder flexion and abduction closed chain; lymphedema do's and don'ts    Person(s) Educated  Patient    Methods  Explanation;Demonstration;Handout    Comprehension  Returned demonstration;Verbalized understanding          PT Long Term Goals - 02/14/19 1143      PT LONG TERM GOAL #1   Title  Patient will demonstrate she has regained full shoulder ROM and function post operatively compared to baseline.    Time  8    Period  Weeks    Status  Achieved            Plan - 02/14/19 1139    Clinical Impression Statement  Patient is doing very well s/p right lumpectomy and sentinel node biopsy. She had an Oncotype score of 24 so she will not have chemo but begins radiation today. Shoulder ROM is nearly back to baseline with slight limitations in flexion and abduction. She has already begun lifting small arm weights and has returned to walking. She will consider particpating in the After Breast Cancer class but was given written information about risk reduction today. She has no other need for PT today.    PT Treatment/Interventions  ADLs/Self Care Home Management;Therapeutic exercise;Patient/family education    PT Next Visit Plan  D/C    PT Home Exercise Plan  Post op shoulder ROM HEP    Consulted and Agree with Plan of Care  Patient       Patient will benefit from skilled therapeutic intervention in order to improve the following deficits  and impairments:  Decreased knowledge of precautions, Impaired UE functional use, Pain, Postural dysfunction, Decreased range of motion  Visit Diagnosis: Malignant neoplasm of upper-inner quadrant of right breast in female, estrogen receptor positive ([facility])  Abnormal posture  Aftercare following surgery for neoplasm     Problem List Patient Active Problem List   Diagnosis Date Noted  . Genetic testing 01/03/2019  . Family history of cervical cancer   . Malignant neoplasm of upper-inner quadrant of right breast in female, estrogen receptor positive ([place]) 12/12/2018  . Allergic rhinitis 03/26/2015  . Cephalgia 03/26/2015  . Laryngopharyngeal reflux (LPR) 03/26/2015   PHYSICAL THERAPY DISCHARGE SUMMARY  Visits from Start of Care: 2  Current  functional level related to goals / functional outcomes: Goals met; see above for objective findings   Remaining deficits: Slight ROM limitation with flexion and abduction but not limiting function.   Education / Equipment: HEP and lymphedema risk reduction education. Plan: Patient agrees to discharge.  Patient goals were met. Patient is being discharged due to meeting the stated rehab goals.  ?????         Annia Friendly, [place] 02/14/19 11:46 AM  [place], [place], [address] Phone: [phone]   Fax:  [phone]  Name: JOCILYN TREGO MRN: [id number] Date of Birth: Dec 17, 1970

## 2019-02-15 ENCOUNTER — Ambulatory Visit
Admission: RE | Admit: 2019-02-15 | Discharge: 2019-02-15 | Disposition: A | Discharge status: Home / Self Care | Payer: BC Managed Care – PPO | Source: Ambulatory Visit | Attending: Radiation Oncology | Admitting: Radiation Oncology

## 2019-02-15 ENCOUNTER — Other Ambulatory Visit: Payer: Self-pay

## 2019-02-15 DIAGNOSIS — C50211 Malignant neoplasm of upper-inner quadrant of right female breast: Secondary | ICD-10-CM | POA: Diagnosis not present

## 2019-02-18 ENCOUNTER — Ambulatory Visit: Payer: BC Managed Care – PPO

## 2019-02-19 ENCOUNTER — Other Ambulatory Visit: Payer: Self-pay

## 2019-02-19 ENCOUNTER — Ambulatory Visit
Admission: RE | Admit: 2019-02-19 | Discharge: 2019-02-19 | Disposition: A | Discharge status: Home / Self Care | Payer: BC Managed Care – PPO | Source: Ambulatory Visit | Attending: Radiation Oncology | Admitting: Radiation Oncology

## 2019-02-19 DIAGNOSIS — Z170 Estrogen receptor positive status [ER+]: Secondary | ICD-10-CM | POA: Insufficient documentation

## 2019-02-19 DIAGNOSIS — Z510 Encounter for antineoplastic radiation therapy: Secondary | ICD-10-CM | POA: Insufficient documentation

## 2019-02-19 DIAGNOSIS — C50211 Malignant neoplasm of upper-inner quadrant of right female breast: Secondary | ICD-10-CM | POA: Insufficient documentation

## 2019-02-19 MED ORDER — RADIAPLEXRX EX GEL
Freq: Once | CUTANEOUS | Status: AC
  Administered 2019-02-19: 13:00:00 via TOPICAL

## 2019-02-19 MED ORDER — ALRA NON-METALLIC DEODORANT (RAD-ONC)
1.0000 "application " | Freq: Once | TOPICAL | Status: AC
  Administered 2019-02-19: 1 via TOPICAL

## 2019-02-20 ENCOUNTER — Other Ambulatory Visit: Payer: Self-pay

## 2019-02-20 ENCOUNTER — Ambulatory Visit
Admission: RE | Admit: 2019-02-20 | Discharge: 2019-02-20 | Disposition: A | Discharge status: Home / Self Care | Payer: BC Managed Care – PPO | Source: Ambulatory Visit | Attending: Radiation Oncology | Admitting: Radiation Oncology

## 2019-02-20 DIAGNOSIS — C50211 Malignant neoplasm of upper-inner quadrant of right female breast: Secondary | ICD-10-CM | POA: Diagnosis not present

## 2019-02-21 ENCOUNTER — Other Ambulatory Visit: Payer: Self-pay

## 2019-02-21 ENCOUNTER — Ambulatory Visit
Admission: RE | Admit: 2019-02-21 | Discharge: 2019-02-21 | Disposition: A | Discharge status: Home / Self Care | Payer: BC Managed Care – PPO | Source: Ambulatory Visit | Attending: Radiation Oncology | Admitting: Radiation Oncology

## 2019-02-21 DIAGNOSIS — C50211 Malignant neoplasm of upper-inner quadrant of right female breast: Secondary | ICD-10-CM | POA: Diagnosis not present

## 2019-02-22 ENCOUNTER — Ambulatory Visit
Admission: RE | Admit: 2019-02-22 | Discharge: 2019-02-22 | Disposition: A | Discharge status: Home / Self Care | Payer: BC Managed Care – PPO | Source: Ambulatory Visit | Attending: Radiation Oncology | Admitting: Radiation Oncology

## 2019-02-22 ENCOUNTER — Other Ambulatory Visit: Payer: Self-pay

## 2019-02-22 DIAGNOSIS — C50211 Malignant neoplasm of upper-inner quadrant of right female breast: Secondary | ICD-10-CM | POA: Diagnosis not present

## 2019-02-26 ENCOUNTER — Ambulatory Visit
Admission: RE | Admit: 2019-02-26 | Discharge: 2019-02-26 | Disposition: A | Discharge status: Home / Self Care | Payer: BC Managed Care – PPO | Source: Ambulatory Visit | Attending: Radiation Oncology | Admitting: Radiation Oncology

## 2019-02-26 ENCOUNTER — Other Ambulatory Visit: Payer: Self-pay

## 2019-02-26 DIAGNOSIS — C50211 Malignant neoplasm of upper-inner quadrant of right female breast: Secondary | ICD-10-CM | POA: Diagnosis not present

## 2019-02-27 ENCOUNTER — Ambulatory Visit
Admission: RE | Admit: 2019-02-27 | Discharge: 2019-02-27 | Disposition: A | Discharge status: Home / Self Care | Payer: BC Managed Care – PPO | Source: Ambulatory Visit | Attending: Radiation Oncology | Admitting: Radiation Oncology

## 2019-02-27 ENCOUNTER — Other Ambulatory Visit: Payer: Self-pay

## 2019-02-27 DIAGNOSIS — C50211 Malignant neoplasm of upper-inner quadrant of right female breast: Secondary | ICD-10-CM | POA: Diagnosis not present

## 2019-02-28 ENCOUNTER — Ambulatory Visit
Admission: RE | Admit: 2019-02-28 | Discharge: 2019-02-28 | Disposition: A | Discharge status: Home / Self Care | Payer: BC Managed Care – PPO | Source: Ambulatory Visit | Attending: Radiation Oncology | Admitting: Radiation Oncology

## 2019-02-28 ENCOUNTER — Other Ambulatory Visit: Payer: Self-pay

## 2019-02-28 DIAGNOSIS — C50211 Malignant neoplasm of upper-inner quadrant of right female breast: Secondary | ICD-10-CM | POA: Diagnosis not present

## 2019-03-01 ENCOUNTER — Ambulatory Visit
Admission: RE | Admit: 2019-03-01 | Discharge: 2019-03-01 | Disposition: A | Discharge status: Home / Self Care | Payer: BC Managed Care – PPO | Source: Ambulatory Visit | Attending: Radiation Oncology | Admitting: Radiation Oncology

## 2019-03-01 ENCOUNTER — Other Ambulatory Visit: Payer: Self-pay

## 2019-03-01 DIAGNOSIS — C50211 Malignant neoplasm of upper-inner quadrant of right female breast: Secondary | ICD-10-CM | POA: Diagnosis not present

## 2019-03-04 ENCOUNTER — Ambulatory Visit
Admission: RE | Admit: 2019-03-04 | Discharge: 2019-03-04 | Disposition: A | Discharge status: Home / Self Care | Payer: BC Managed Care – PPO | Source: Ambulatory Visit | Attending: Radiation Oncology | Admitting: Radiation Oncology

## 2019-03-04 ENCOUNTER — Other Ambulatory Visit: Payer: Self-pay

## 2019-03-04 ENCOUNTER — Ambulatory Visit
Admission: RE | Admit: 2019-03-04 | Payer: BC Managed Care – PPO | Source: Ambulatory Visit | Admitting: Radiation Oncology

## 2019-03-04 DIAGNOSIS — C50211 Malignant neoplasm of upper-inner quadrant of right female breast: Secondary | ICD-10-CM | POA: Diagnosis not present

## 2019-03-05 ENCOUNTER — Ambulatory Visit
Admission: RE | Admit: 2019-03-05 | Discharge: 2019-03-05 | Disposition: A | Discharge status: Home / Self Care | Payer: BC Managed Care – PPO | Source: Ambulatory Visit | Attending: Radiation Oncology | Admitting: Radiation Oncology

## 2019-03-05 ENCOUNTER — Ambulatory Visit: Payer: BC Managed Care – PPO | Admitting: Radiation Oncology

## 2019-03-05 DIAGNOSIS — C50211 Malignant neoplasm of upper-inner quadrant of right female breast: Secondary | ICD-10-CM | POA: Diagnosis not present

## 2019-03-06 ENCOUNTER — Ambulatory Visit: Payer: BC Managed Care – PPO | Admitting: Radiation Oncology

## 2019-03-06 ENCOUNTER — Ambulatory Visit
Admission: RE | Admit: 2019-03-06 | Discharge: 2019-03-06 | Disposition: A | Discharge status: Home / Self Care | Payer: BC Managed Care – PPO | Source: Ambulatory Visit | Attending: Radiation Oncology | Admitting: Radiation Oncology

## 2019-03-06 ENCOUNTER — Other Ambulatory Visit: Payer: Self-pay

## 2019-03-06 DIAGNOSIS — C50211 Malignant neoplasm of upper-inner quadrant of right female breast: Secondary | ICD-10-CM | POA: Diagnosis not present

## 2019-03-07 ENCOUNTER — Ambulatory Visit: Payer: BC Managed Care – PPO | Admitting: Radiation Oncology

## 2019-03-07 ENCOUNTER — Other Ambulatory Visit: Payer: Self-pay

## 2019-03-07 ENCOUNTER — Ambulatory Visit
Admission: RE | Admit: 2019-03-07 | Discharge: 2019-03-07 | Disposition: A | Discharge status: Home / Self Care | Payer: BC Managed Care – PPO | Source: Ambulatory Visit | Attending: Radiation Oncology | Admitting: Radiation Oncology

## 2019-03-07 DIAGNOSIS — C50211 Malignant neoplasm of upper-inner quadrant of right female breast: Secondary | ICD-10-CM | POA: Diagnosis not present

## 2019-03-08 ENCOUNTER — Other Ambulatory Visit: Payer: Self-pay

## 2019-03-08 ENCOUNTER — Ambulatory Visit: Payer: BC Managed Care – PPO

## 2019-03-08 ENCOUNTER — Ambulatory Visit
Admission: RE | Admit: 2019-03-08 | Discharge: 2019-03-08 | Disposition: A | Discharge status: Home / Self Care | Payer: BC Managed Care – PPO | Source: Ambulatory Visit | Attending: Radiation Oncology | Admitting: Radiation Oncology

## 2019-03-08 DIAGNOSIS — Z170 Estrogen receptor positive status [ER+]: Secondary | ICD-10-CM

## 2019-03-08 DIAGNOSIS — C50211 Malignant neoplasm of upper-inner quadrant of right female breast: Secondary | ICD-10-CM

## 2019-03-08 MED ORDER — RADIAPLEXRX EX GEL
Freq: Once | CUTANEOUS | Status: AC
  Administered 2019-03-08: 16:00:00 via TOPICAL

## 2019-03-11 ENCOUNTER — Ambulatory Visit: Payer: BC Managed Care – PPO

## 2019-03-11 ENCOUNTER — Ambulatory Visit
Admission: RE | Admit: 2019-03-11 | Discharge: 2019-03-11 | Disposition: A | Discharge status: Home / Self Care | Payer: BC Managed Care – PPO | Source: Ambulatory Visit | Attending: Radiation Oncology | Admitting: Radiation Oncology

## 2019-03-11 ENCOUNTER — Other Ambulatory Visit: Payer: Self-pay

## 2019-03-11 DIAGNOSIS — C50211 Malignant neoplasm of upper-inner quadrant of right female breast: Secondary | ICD-10-CM | POA: Diagnosis not present

## 2019-03-11 NOTE — Progress Notes (Signed)
Patient Care Team: Spry, Marsh Dolly., MD as PCP - General (Family Medicine) Rockwell Germany, RN as Oncology Nurse Navigator Mauro Kaufmann, RN as Oncology Nurse Navigator Jovita Kussmaul, MD as Consulting Physician (General Surgery) Nicholas Lose, MD as Consulting Physician (Hematology and Oncology) Eppie Gibson, MD as Attending Physician (Radiation Oncology)  DIAGNOSIS:    ICD-10-CM   1. Malignant neoplasm of upper-inner quadrant of right breast in female, estrogen receptor positive ([facility])  C50.211    Z17.0     SUMMARY OF ONCOLOGIC HISTORY: Oncology History  Malignant neoplasm of upper-inner quadrant of right breast in female, estrogen receptor positive ([place])  12/12/2018 Initial Diagnosis   Screening mammogram detected a distortion in the right breast, measuring 64m on diagnostic mammogram and [place] Biopsy confirmed grade 1 IDC with DCIS, HER-2 negative by FISH, ER 90%, PR 90%, Ki67 10%.    12/19/2018 Cancer Staging   Staging form: Breast, AJCC 8th Edition - Clinical stage from 12/19/2018: Stage IA (cT1b, cN0, cM0, G1, ER+, PR+, HER2-) - Signed by GNicholas Lose MD on 12/19/2018   01/02/2019 Genetic Testing   Negative genetic testing on the [facility] Breast Cancer STAT panel. The STAT Breast cancer panel offered by [facility] includes sequencing and rearrangement analysis for the following 9 genes:  ATM, BRCA1, BRCA2, CDH1, CHEK2, PALB2, PTEN, STK11 and TP53. The report date is 01/02/2019.   01/17/2019 Surgery   Right lumpectomy (Marlou Starks: IDC with DCIS, 1.2cm, grade 1, clear margins, 2 axillary lymph nodes negative.   01/31/2019 Oncotype testing   Score of 24 with 10% chance of distant recurrence in 9 years without systemic treatment.    02/15/2019 -  Radiation Therapy   Adjuvant radiation treatment     CHIEF COMPLIANT: Follow-up to discuss anti-estrogen therapy   INTERVAL HISTORY: Debbie Berry a 48y.o. with above-mentioned history of right breast cancer who underwent a lumpectomy and is  currently on radiation treatment. She presents to the clinic today to discuss anti-estrogen therapy.   REVIEW OF SYSTEMS:   Constitutional: Denies fevers, chills or abnormal weight loss Eyes: Denies blurriness of vision Ears, nose, mouth, throat, and face: Denies mucositis or sore throat Respiratory: Denies cough, dyspnea or wheezes Cardiovascular: Denies palpitation, chest discomfort Gastrointestinal: Denies nausea, heartburn or change in bowel habits Skin: Denies abnormal skin rashes Lymphatics: Denies new lymphadenopathy or easy bruising Neurological: Denies numbness, tingling or new weaknesses Behavioral/Psych: Mood is stable, no new changes  Extremities: No lower extremity edema Breast: denies any pain or lumps or nodules in either breasts All other systems were reviewed with the patient and are negative.  I have reviewed the past medical history, past surgical history, social history and family history with the patient and they are unchanged from previous note.  ALLERGIES:  is allergic to nsaids and penicillins.  MEDICATIONS:  Current Outpatient Medications  Medication Sig Dispense Refill  . calcium carbonate (OSCAL) 1500 (600 Ca) MG TABS tablet Take by mouth 2 (two) times daily with a meal. Calcium caltrate    . cholecalciferol (VITAMIN D3) 25 MCG (1000 UT) tablet Take 1,000 Units by mouth daily. 2000 IU    . COLLAGEN PO Take by mouth.    . Cyanocobalamin (VITAMIN B 12 PO) Take by mouth.    .Marland KitchenHYDROcodone-acetaminophen (NORCO/VICODIN) 5-325 MG tablet Take 1-2 tablets by mouth every 6 (six) hours as needed for moderate pain or severe pain. (Patient not taking: Reported on 02/06/2019) 15 tablet 0  . Omega-3 Fatty Acids (FISH OIL)  1200 MG CAPS Take by mouth.     No current facility-administered medications for this visit.     PHYSICAL EXAMINATION: ECOG PERFORMANCE STATUS: 1 - Symptomatic but completely ambulatory  Vitals:   03/12/19 1510  BP: 124/83  Pulse: 76  Resp: 18   Temp: 97.7 F (36.5 C)  SpO2: 99%   Filed Weights   03/12/19 1510  Weight: 149 lb 11.2 oz (67.9 kg)    GENERAL: alert, no distress and comfortable SKIN: skin color, texture, turgor are normal, no rashes or significant lesions EYES: normal, Conjunctiva are pink and non-injected, sclera clear OROPHARYNX: no exudate, no erythema and lips, buccal mucosa, and tongue normal  NECK: supple, thyroid normal size, non-tender, without nodularity LYMPH: no palpable lymphadenopathy in the cervical, axillary or inguinal LUNGS: clear to auscultation and percussion with normal breathing effort HEART: regular rate & rhythm and no murmurs and no lower extremity edema ABDOMEN: abdomen soft, non-tender and normal bowel sounds MUSCULOSKELETAL: no cyanosis of digits and no clubbing  NEURO: alert & oriented x 3 with fluent speech, no focal motor/sensory deficits EXTREMITIES: No lower extremity edema  LABORATORY DATA:  I have reviewed the data as listed CMP Latest Ref Rng & Units 12/19/2018  Glucose 70 - 99 mg/dL 93  BUN 6 - 20 mg/dL 10  Creatinine 0.44 - 1.00 mg/dL 0.80  Sodium 135 - 145 mmol/L 139  Potassium 3.5 - 5.1 mmol/L 3.6  Chloride 98 - 111 mmol/L 106  CO2 22 - 32 mmol/L 23  Calcium 8.9 - 10.3 mg/dL 8.9  Total Protein 6.5 - 8.1 g/dL 7.3  Total Bilirubin 0.3 - 1.2 mg/dL 0.4  Alkaline Phos 38 - 126 U/L 34(L)  AST 15 - 41 U/L 20  ALT 0 - 44 U/L 32    Lab Results  Component Value Date   WBC 6.4 12/19/2018   HGB 14.6 12/19/2018   HCT 44.8 12/19/2018   MCV 88.2 12/19/2018   PLT 256 12/19/2018   NEUTROABS 4.4 12/19/2018    ASSESSMENT & PLAN:  Malignant neoplasm of upper-inner quadrant of right breast in female, estrogen receptor positive ([place]) 12/12/2018:Screening mammogram detected a distortion in the right breast, measuring 60m on diagnostic mammogram and [place] Biopsy confirmed grade 1 IDC with DCIS, HER-2 negative by FISH, ER 90%, PR 90%, Ki67 10%.  01/17/19: Rt Lumpectomy IDC with  DCIS, 1.2cm, grade 1, clear margins, 2 axillary lymph nodes negative.HER-2 negative by FISH, ER 90%, PR 90%, Ki67 10%.  Oncotype DX score 24: 10% ROR Current treatment: Adjuvant radiation therapy started 02/07/2019 Treatment plan: Once radiation is complete antiestrogen therapy.  In order to determine the appropriate antiestrogen therapy [facility]  I will call her with results of these tests and determine the appropriate antiestrogen therapy.  I discussed the risks and benefits of both tamoxifen and anastrozole.  Return to clinic in 3 months for survivorship care plan visit   No orders of the defined types were placed in this encounter.  The patient has a good understanding of the overall plan. she agrees with it. she will call with any problems that may develop before the next visit here.  GNicholas Lose MD 03/12/2019  IJulious OkaDorshimer am acting as scribe for Dr. VNicholas Lose  I have reviewed the above documentation for accuracy and completeness, and I agree with the above.

## 2019-03-12 ENCOUNTER — Ambulatory Visit
Admission: RE | Admit: 2019-03-12 | Discharge: 2019-03-12 | Disposition: A | Discharge status: Home / Self Care | Payer: BC Managed Care – PPO | Source: Ambulatory Visit | Attending: Radiation Oncology | Admitting: Radiation Oncology

## 2019-03-12 ENCOUNTER — Inpatient Hospital Stay: Payer: BC Managed Care – PPO | Attending: Hematology and Oncology | Admitting: Hematology and Oncology

## 2019-03-12 ENCOUNTER — Other Ambulatory Visit: Payer: Self-pay

## 2019-03-12 ENCOUNTER — Inpatient Hospital Stay: Payer: BC Managed Care – PPO

## 2019-03-12 DIAGNOSIS — Z170 Estrogen receptor positive status [ER+]: Secondary | ICD-10-CM

## 2019-03-12 DIAGNOSIS — Z79899 Other long term (current) drug therapy: Secondary | ICD-10-CM | POA: Diagnosis not present

## 2019-03-12 DIAGNOSIS — C50211 Malignant neoplasm of upper-inner quadrant of right female breast: Secondary | ICD-10-CM

## 2019-03-12 NOTE — Assessment & Plan Note (Signed)
12/12/2018:Screening mammogram detected a distortion in the right breast, measuring 88m on diagnostic mammogram and [place] Biopsy confirmed grade 1 IDC with DCIS, HER-2 negative by FISH, ER 90%, PR 90%, Ki67 10%.  01/17/19: Rt Lumpectomy IDC with DCIS, 1.2cm, grade 1, clear margins, 2 axillary lymph nodes negative.HER-2 negative by FISH, ER 90%, PR 90%, Ki67 10%.  Oncotype DX score 24: 10% ROR Current treatment: Adjuvant radiation therapy started 02/07/2019 Treatment plan: Once radiation is complete antiestrogen therapy  Return to clinic in 3 months for survivorship care plan visit

## 2019-03-13 ENCOUNTER — Ambulatory Visit
Admission: RE | Admit: 2019-03-13 | Discharge: 2019-03-13 | Disposition: A | Discharge status: Home / Self Care | Payer: BC Managed Care – PPO | Source: Ambulatory Visit | Attending: Radiation Oncology | Admitting: Radiation Oncology

## 2019-03-13 ENCOUNTER — Telehealth: Payer: Self-pay | Admitting: Adult Health

## 2019-03-13 DIAGNOSIS — C50211 Malignant neoplasm of upper-inner quadrant of right female breast: Secondary | ICD-10-CM | POA: Diagnosis not present

## 2019-03-13 DIAGNOSIS — Z170 Estrogen receptor positive status [ER+]: Secondary | ICD-10-CM

## 2019-03-13 LAB — FOLLICLE STIMULATING HORMONE: FSH: 66.7 m[IU]/mL

## 2019-03-13 MED ORDER — RADIAPLEXRX EX GEL
Freq: Once | CUTANEOUS | Status: AC
  Administered 2019-03-13: 16:00:00 via TOPICAL

## 2019-03-13 NOTE — Telephone Encounter (Signed)
I talk with patient regarding schedule  

## 2019-03-14 ENCOUNTER — Ambulatory Visit: Payer: BC Managed Care – PPO

## 2019-03-14 ENCOUNTER — Other Ambulatory Visit: Payer: Self-pay

## 2019-03-14 ENCOUNTER — Ambulatory Visit
Admission: RE | Admit: 2019-03-14 | Discharge: 2019-03-14 | Disposition: A | Discharge status: Home / Self Care | Payer: BC Managed Care – PPO | Source: Ambulatory Visit | Attending: Radiation Oncology | Admitting: Radiation Oncology

## 2019-03-14 DIAGNOSIS — C50211 Malignant neoplasm of upper-inner quadrant of right female breast: Secondary | ICD-10-CM | POA: Diagnosis not present

## 2019-03-15 ENCOUNTER — Other Ambulatory Visit: Payer: Self-pay

## 2019-03-15 ENCOUNTER — Encounter: Payer: Self-pay | Admitting: *Deleted

## 2019-03-15 ENCOUNTER — Other Ambulatory Visit: Payer: Self-pay | Admitting: Hematology and Oncology

## 2019-03-15 ENCOUNTER — Ambulatory Visit
Admission: RE | Admit: 2019-03-15 | Discharge: 2019-03-15 | Disposition: A | Discharge status: Home / Self Care | Payer: BC Managed Care – PPO | Source: Ambulatory Visit | Attending: Radiation Oncology | Admitting: Radiation Oncology

## 2019-03-15 ENCOUNTER — Encounter: Payer: Self-pay | Admitting: Radiation Oncology

## 2019-03-15 DIAGNOSIS — C50211 Malignant neoplasm of upper-inner quadrant of right female breast: Secondary | ICD-10-CM | POA: Diagnosis not present

## 2019-03-15 MED ORDER — ANASTROZOLE 1 MG PO TABS: 1.0000 mg | ORAL_TABLET | Freq: Every day | ORAL | 3 refills | Status: DC

## 2019-03-15 NOTE — Progress Notes (Signed)
I informed her that [facility] was [facility] and that indicates that she is in menopause. I instructed her to start anastrozole in 2 weeks. I sent a prescription to her pharmacy.

## 2019-03-18 LAB — ESTRADIOL, ULTRA SENS: Estradiol, Sensitive: 35.7 pg/mL

## 2019-03-22 ENCOUNTER — Telehealth: Payer: Self-pay

## 2019-03-22 NOTE — Telephone Encounter (Signed)
RN spoke with patient regarding questions about recent lab results.  RN reviewed results, answered questions related to concerned with need of birth control.   RN encouraged barrier methods.  Pt voiced understanding.  No further needs.

## 2019-04-02 ENCOUNTER — Telehealth: Payer: Self-pay | Admitting: *Deleted

## 2019-04-02 NOTE — Telephone Encounter (Signed)
Received phone call from pt stating she had a failed source of contraception last night with a condom breaking.  Pt wanting to know if Dr. Lindi Adie would be okay with pt taking the morning after pill.  Per Dr. Lindi Adie, okay for pt to take this pill.  Pt also stating she has been experiencing left hip pain and back pain at night when sleeping.  Pt states pain is relieved in the morning when she gets up and moves around.  Per Dr. Lindi Adie, this is a sign of menopause and pt needs to start exercising and stretching before bed.  Pt educated on stretching and also educated to call the office if symptoms become worse.  Pt verbalized understanding.

## 2019-04-15 NOTE — Progress Notes (Signed)
I called the patient today about her upcoming follow-up appointment in radiation oncology.   Given concerns about the COVID-19 pandemic, I offered a phone assessment with the patient to determine if coming to the clinic was necessary. She accepted.  I let the patient know that I had spoken with Dr. Isidore Moos, and she wanted them to know the importance of washing their hands for at least 20 seconds at a time, especially after going out in public, and before they eat.  Limit going out in public whenever possible. Do not touch your face, unless your hands are clean, such as when bathing. Get plenty of rest, eat well, and stay hydrated.   The patient denies any symptomatic concerns.  Specifically, they report good healing of their skin in the radiation fields.  Skin is intact.    I recommended that she continue skin care by applying oil or lotion with vitamin E to the skin in the radiation fields, BID, for 2 more months.  Continue follow-up with medical oncology - follow-up is scheduled on 06/04/19 with Wilber Bihari NP for survivorship .  I explained that yearly mammograms are important for patients with intact breast tissue, and physical exams are important after mastectomy for patients that cannot undergo mammography.  I encouraged her to call if she had further questions or concerns about her healing. Otherwise, she will follow-up PRN in radiation oncology. Patient is pleased with this plan, and we will cancel her upcoming follow-up to reduce the risk of COVID-19 transmission.

## 2019-04-16 ENCOUNTER — Ambulatory Visit
Admission: RE | Admit: 2019-04-16 | Discharge: 2019-04-16 | Disposition: A | Discharge status: Home / Self Care | Payer: BC Managed Care – PPO | Source: Ambulatory Visit | Attending: Radiation Oncology | Admitting: Radiation Oncology

## 2019-05-20 ENCOUNTER — Telehealth: Payer: Self-pay | Admitting: Adult Health

## 2019-05-20 NOTE — Telephone Encounter (Signed)
I left a message regarding schedule  

## 2019-05-24 NOTE — Progress Notes (Signed)
  Patient Name: Debbie Berry MRN: [id number] DOB: 1971-05-12 Referring Physician: Nicholas Lose (Profile Not Attached) Date of Service: 03/15/2019 [facility], [place]                                                        End Of Treatment Note  Diagnoses: C50.211-Malignant neoplasm of upper-inner quadrant of right female breast  Cancer Staging: Cancer Staging Malignant neoplasm of upper-inner quadrant of right breast in female, estrogen receptor positive ([place]) Staging form: Breast, AJCC 8th Edition - Clinical stage from 12/19/2018: Stage IA (cT1b, cN0, cM0, G1, ER+, PR+, HER2-) - Signed by Nicholas Lose, MD on 12/19/2018  pT1c, pN0  Intent: Curative  Radiation Treatment Dates: 02/14/2019 through 03/15/2019 Site Technique Total Dose Dose per Fx Completed Fx Beam Energies  Breast: Breast_Rt 3D 40.05/40.05 Gy 2.67 15/15 6X  Breast: Breast_Rt_Bst specialPort 10/10 2 5/5 9E, 12E   Narrative: The patient tolerated radiation therapy relatively well.   Plan: The patient will follow-up with radiation oncology in 19mo or as needed.  -----------------------------------  SEppie Gibson MD

## 2019-06-03 ENCOUNTER — Telehealth: Payer: Self-pay | Admitting: Adult Health

## 2019-06-03 NOTE — Telephone Encounter (Signed)
Contacted patient to verify telephone visit for pre reg °

## 2019-06-04 ENCOUNTER — Inpatient Hospital Stay: Payer: BC Managed Care – PPO | Attending: Hematology and Oncology | Admitting: Adult Health

## 2019-06-04 DIAGNOSIS — Z79899 Other long term (current) drug therapy: Secondary | ICD-10-CM | POA: Diagnosis not present

## 2019-06-04 DIAGNOSIS — C50211 Malignant neoplasm of upper-inner quadrant of right female breast: Secondary | ICD-10-CM

## 2019-06-04 DIAGNOSIS — Z170 Estrogen receptor positive status [ER+]: Secondary | ICD-10-CM | POA: Diagnosis not present

## 2019-06-04 DIAGNOSIS — Z8049 Family history of malignant neoplasm of other genital organs: Secondary | ICD-10-CM

## 2019-06-04 DIAGNOSIS — Z79811 Long term (current) use of aromatase inhibitors: Secondary | ICD-10-CM | POA: Diagnosis not present

## 2019-06-04 DIAGNOSIS — Z87891 Personal history of nicotine dependence: Secondary | ICD-10-CM

## 2019-06-04 MED ORDER — TAMOXIFEN CITRATE 10 MG PO TABS: 10.0000 mg | ORAL_TABLET | Freq: Two times a day (BID) | ORAL | 0 refills | Status: DC

## 2019-06-04 NOTE — Progress Notes (Addendum)
SURVIVORSHIP VIRTUAL VISIT:  I connected with Isebella Kunin on 06/04/19 at  3:30 PM EST by my chart video and verified that I am speaking with the correct person using two identifiers.  I discussed the limitations, risks, security and privacy concerns of performing an evaluation and management service virtually and the availability of in person appointments. I also discussed with the patient that there may be a patient responsible charge related to this service. The patient expressed understanding and agreed to proceed.   Patient Location:  At home in private area Provider Location: [place] office  BRIEF ONCOLOGIC HISTORY:  Oncology History  Malignant neoplasm of upper-inner quadrant of right breast in female, estrogen receptor positive ([place])  12/12/2018 Initial Diagnosis   Screening mammogram detected a distortion in the right breast, measuring 58m on diagnostic mammogram and [place] Biopsy confirmed grade 1 IDC with DCIS, HER-2 negative by FISH, ER 90%, PR 90%, Ki67 10%.    12/19/2018 Cancer Staging   Staging form: Breast, AJCC 8th Edition - Clinical stage from 12/19/2018: Stage IA (cT1b, cN0, cM0, G1, ER+, PR+, HER2-) - Signed by GNicholas Lose MD on 12/19/2018   01/02/2019 Genetic Testing   Negative genetic testing on the [facility] Breast Cancer STAT panel. The STAT Breast cancer panel offered by [facility] includes sequencing and rearrangement analysis for the following 9 genes:  ATM, BRCA1, BRCA2, CDH1, CHEK2, PALB2, PTEN, STK11 and TP53. The report date is 01/02/2019.   01/17/2019 Surgery   Right lumpectomy (Marlou Starks: IDC with DCIS, 1.2cm, grade 1, clear margins, 2 axillary lymph nodes negative.   01/17/2019 Cancer Staging   Staging form: Breast, AJCC 8th Edition - Pathologic stage from 01/17/2019: Stage IA (pT1c, pN0, cM0, G1, ER+, PR+, HER2-, Oncotype DX score: 24) - Signed by CGardenia Phlegm NP on 05/27/2019   01/31/2019 Oncotype testing   Score of 24 with 10% chance of distant recurrence in 9 years  without systemic treatment.    02/15/2019 -  Radiation Therapy   Adjuvant radiation treatment   03/2019 -  Anti-estrogen oral therapy   Anastrozole     INTERVAL HISTORY:  Ms. FHuieto review her survivorship care plan detailing her treatment course for breast cancer, as well as monitoring long-term side effects of that treatment, education regarding health maintenance, screening, and overall wellness and health promotion.     Overall, Ms. Safer reports feeling quite well. She notes she is taking the anastrozole daily and is tolerating it quite well.  She has no issues.    REVIEW OF SYSTEMS:  Review of Systems  Constitutional: Negative for appetite change, chills, fatigue, fever and unexpected weight change.  HENT:   Negative for hearing loss, lump/mass and trouble swallowing.   Eyes: Negative for eye problems and icterus.  Respiratory: Negative for chest tightness, cough and shortness of breath.   Cardiovascular: Negative for chest pain, leg swelling and palpitations.  Gastrointestinal: Negative for abdominal distention, abdominal pain, constipation, diarrhea, nausea and vomiting.  Endocrine: Negative for hot flashes.  Musculoskeletal: Negative for arthralgias.  Skin: Negative for itching and rash.  Neurological: Negative for dizziness, extremity weakness, headaches and numbness.  Hematological: Negative for adenopathy. Does not bruise/bleed easily.  Psychiatric/Behavioral: Negative for depression. The patient is not nervous/anxious.   Breast: Denies any new nodularity, masses, tenderness, nipple changes, or nipple discharge.      ONCOLOGY TREATMENT TEAM:  1. Surgeon:  [facility] 2. Medical Oncologist: Dr. GLindi Adie 3. Radiation Oncologist: Dr. SIsidore Moos  PAST MEDICAL/SURGICAL HISTORY:  Past Medical History:  Diagnosis Date  . Breast cancer ([place])   . Family history of cervical cancer   . Migraine   . Vertigo    Past Surgical History:  Procedure  Laterality Date  . BREAST LUMPECTOMY WITH RADIOACTIVE SEED AND SENTINEL LYMPH NODE BIOPSY Right 01/17/2019   Procedure: RIGHT BREAST LUMPECTOMY WITH RADIOACTIVE SEED AND SENTINEL LYMPH NODE BIOPSY;  Surgeon: Jovita Kussmaul, MD;  Location: [facility];  Service: General;  Laterality: Right;  . CHOLECYSTECTOMY    . HERNIA REPAIR       ALLERGIES:  Allergies  Allergen Reactions  . Nsaids Swelling    Swelling to lips and eyes  . Penicillins      CURRENT MEDICATIONS:  Outpatient Encounter Medications as of 06/04/2019  Medication Sig  . anastrozole (ARIMIDEX) 1 MG tablet Take 1 tablet (1 mg total) by mouth daily.  . calcium carbonate (OSCAL) 1500 (600 Ca) MG TABS tablet Take by mouth 2 (two) times daily with a meal. Calcium caltrate  . cholecalciferol (VITAMIN D3) 25 MCG (1000 UT) tablet Take 1,000 Units by mouth daily. 2000 IU  . COLLAGEN PO Take by mouth.  . Cyanocobalamin (VITAMIN B 12 PO) Take by mouth.  Marland Kitchen HYDROcodone-acetaminophen (NORCO/VICODIN) 5-325 MG tablet Take 1-2 tablets by mouth every 6 (six) hours as needed for moderate pain or severe pain.  . Omega-3 Fatty Acids (FISH OIL) 1200 MG CAPS Take by mouth.   No facility-administered encounter medications on file as of 06/04/2019.     ONCOLOGIC FAMILY HISTORY:  Family History  Problem Relation Age of Onset  . Cervical cancer Paternal Grandmother   . Cervical cancer Paternal Aunt 66     GENETIC COUNSELING/TESTING: See above  SOCIAL HISTORY:  Social History   Socioeconomic History  . Marital status: Married    Spouse name: Not on file  . Number of children: Not on file  . Years of education: Not on file  . Highest education level: Not on file  Occupational History  . Not on file  Tobacco Use  . Smoking status: Former Smoker    Types: Cigarettes  . Smokeless tobacco: Never Used  Substance and Sexual Activity  . Alcohol use: Yes    Comment: rare  . Drug use: Never  . Sexual activity: Not on  file  Other Topics Concern  . Not on file  Social History Narrative  . Not on file   Social Determinants of Health   Financial Resource Strain:   . Difficulty of Paying Living Expenses: Not on file  Food Insecurity:   . Worried About Charity fundraiser in the Last Year: Not on file  . Ran Out of Food in the Last Year: Not on file  Transportation Needs: No Transportation Needs  . Lack of Transportation (Medical): No  . Lack of Transportation (Non-Medical): No  Physical Activity:   . Days of Exercise per Week: Not on file  . Minutes of Exercise per Session: Not on file  Stress:   . Feeling of Stress : Not on file  Social Connections:   . Frequency of Communication with Friends and Family: Not on file  . Frequency of Social Gatherings with Friends and Family: Not on file  . Attends Religious Services: Not on file  . Active Member of Clubs or Organizations: Not on file  . Attends Archivist Meetings: Not on file  . Marital Status: Not on file  Intimate Partner  Violence: Not At Risk  . Fear of Current or Ex-Partner: No  . Emotionally Abused: No  . Physically Abused: No  . Sexually Abused: No     OBSERVATIONS/OBJECTIVE:  Patient appears well.  She is in no apparent distress.  Mood and behavior is normal.  Breathing is non labored.  Skin visualized is without rash or lesion.    LABORATORY DATA:  None for this visit.  DIAGNOSTIC IMAGING:  None for this visit.      ASSESSMENT AND PLAN:  Ms.. Donahey is a pleasant 48 y.o. female with Stage IA right breast invasive ductal carcinoma, ER+/PR+/HER2-, diagnosed in 11/2018, treated with lumpectomy, adjuvant radiation therapy, and anti-estrogen therapy with Anastrozole beginning in 03/2019.  She presents to the Survivorship Clinic for our initial meeting and routine follow-up post-completion of treatment for breast cancer.    1. Stage IA right breast cancer:  Ms. Holohan is continuing to recover from definitive treatment for  breast cancer. She will follow-up with her medical oncologist, Dr. Lindi Adie in 3 months with history and physical exam per surveillance protocol.  Her mammogram is due 11/2019; orders placed today. She and I reviewed breast density and inidcations for breast MRI.  I reviewed with her that supplemental breast MRIs are currently recommended for Breast Density D.  She has breast density C.  She could undergo abbreviated breast MRI at [facility], and I explained this to her.  She would like to do this, and we will plan for this in 05/2020; orders placed today.  She is aware of $400 out of pocket cost for this.    Ellen is taking Anastrozole, however two months ago her Eagle was elevated compatible with post menopause, but her Estradiol was elevated, demonstrating she was not in menopause.  Her last menstrual cycle was in 03/2019.  I let her know that the Anastrozole works to block the estrogens from the fat cells and adrenal glands, but does not block the estrogens from the ovaries.  If she is hasn't gone through menopause, then the Anastrozole will not block the estrogens in her body.  She understands this.  I reviewed with her the recommendations to start Tamoxifen daily.  She also talked with Dr. Lindi Adie about this today.  She understands this.  She will start Tamoxifen and 36m BID.  We will call her in one month to see how she is doing on it.   Today, a comprehensive survivorship care plan and treatment summary was reviewed with the patient today detailing her breast cancer diagnosis, treatment course, potential late/long-term effects of treatment, appropriate follow-up care with recommendations for the future, and patient education resources.  A copy of this summary, along with a letter will be sent to the patient's primary care provider via mail/fax/In Basket message after today's visit.    2. Bone health:  Given Ms. Resh's age/history of breast cancer, she is at risk for bone demineralization.  I  reviewed that Tamoxifen helps to increase the bone density.  She was given education on specific activities to promote bone health.  3. Cancer screening:  Due to Ms. Wrightsman's history and her age, she should receive screening for skin cancers, colon cancer, and gynecologic cancers.  The information and recommendations are listed on the patient's comprehensive care plan/treatment summary and were reviewed in detail with the patient.    4. Health maintenance and wellness promotion: Ms. FLawalwas encouraged to consume 5-7 servings of fruits and vegetables per day. We reviewed the "Nutrition Rainbow"  handout, as well as the handout "Take Control of Your Health and Reduce Your Cancer Risk" from the [place].  She was also encouraged to engage in moderate to vigorous exercise for 30 minutes per day most days of the week. We discussed the [employer] fitness program, which is designed for cancer survivors to help them become more physically fit after cancer treatments.  She was instructed to limit her alcohol consumption and continue to abstain from tobacco use.     5. Support services/counseling: It is not uncommon for this period of the patient's cancer care trajectory to be one of many emotions and stressors.  We discussed how this can be increasingly difficult during the times of quarantine and social distancing due to the COVID-19 pandemic.   She was given information regarding our available services and encouraged to contact me with any questions or for help enrolling in any of our support group/programs.    Follow up instructions:    -Return to cancer center in 3 months for f/u with Dr. Lindi Adie  -Mammogram due in 11/2019 -Abbreviated MRI in 05/2020 -Follow up with surgery 9 months for f/u with Dr. Marlou Starks -She is welcome to return back to the Survivorship Clinic at any time; no additional follow-up needed at this time.  -Consider referral back to survivorship as a long-term survivor for  continued surveillance  The patient was provided an opportunity to ask questions and all were answered. The patient agreed with the plan and demonstrated an understanding of the instructions.   The patient was advised to call back or seek an in-person evaluation if the symptoms worsen or if the condition fails to improve as anticipated.   I provided 35 minutes of face-to-face video visit time during this encounter, and > 50% was spent counseling as documented under my assessment & plan.  After the initial portion of the visit,   Scot Dock, NP   Attending Note  I personally spoke with Luree Erasmo Leventhal. The plan of care was discussed with her. I agree with the assessment and plan as documented above. I reviewed the results of her estradiol and FSH. Even though the [facility] was in the menopausal range the estradiol was not. She even had a menstrual cycle few days before starting anastrozole. It is for this reason that she should be considered to be premenopausal.  I recommended switching her treatment from anastrozole to tamoxifen. She will start tamoxifen 10 mg daily and then if she tolerates it well will increase it to 20 mg daily. We will touch base with her in 1 month to assess tolerability to tamoxifen. Signed Harriette Ohara, MD

## 2019-06-05 ENCOUNTER — Telehealth: Payer: Self-pay | Admitting: Hematology and Oncology

## 2019-06-05 NOTE — Telephone Encounter (Signed)
I talk with patient regarding schedule  

## 2019-06-08 ENCOUNTER — Encounter: Payer: Self-pay | Admitting: Adult Health

## 2019-06-12 ENCOUNTER — Other Ambulatory Visit: Payer: Self-pay | Admitting: *Deleted

## 2019-06-12 DIAGNOSIS — C50211 Malignant neoplasm of upper-inner quadrant of right female breast: Secondary | ICD-10-CM

## 2019-06-12 DIAGNOSIS — Z170 Estrogen receptor positive status [ER+]: Secondary | ICD-10-CM

## 2019-07-01 ENCOUNTER — Other Ambulatory Visit: Payer: Self-pay | Admitting: Adult Health

## 2019-07-01 DIAGNOSIS — C50211 Malignant neoplasm of upper-inner quadrant of right female breast: Secondary | ICD-10-CM

## 2019-07-01 DIAGNOSIS — Z170 Estrogen receptor positive status [ER+]: Secondary | ICD-10-CM

## 2019-07-30 ENCOUNTER — Other Ambulatory Visit: Payer: Self-pay | Admitting: Adult Health

## 2019-07-30 ENCOUNTER — Telehealth: Payer: Self-pay

## 2019-07-30 DIAGNOSIS — Z170 Estrogen receptor positive status [ER+]: Secondary | ICD-10-CM

## 2019-07-30 DIAGNOSIS — C50211 Malignant neoplasm of upper-inner quadrant of right female breast: Secondary | ICD-10-CM

## 2019-07-30 NOTE — Telephone Encounter (Signed)
Will you see if patient is tolerating?  If so, please call in 90 day supply.  Thanks,  Mendel Ryder

## 2019-07-30 NOTE — Telephone Encounter (Signed)
Patient states she is not having any side effects at this time. She would like to know if this is normal and does she need to have labs checked to ensure her hormone levels are good.  Please advise.

## 2019-07-30 NOTE — Telephone Encounter (Signed)
Her hormone levels (FSH/Estradiol) tested in 02/2019 indicate that she is not in menopause and she is still making estrogens from her ovaries. Tamoxifen is the only anti estrogen therapy that will block estrogens made from her ovaries.  If she wants her hormone levels retested, I would recommend having a lab appointment scheduled for one week prior to her visit with Dr. Lindi Adie next month.  That way, the results will be back prior to her appointment with him so she can discuss with him in detail.  She should only switch to Anastrozole if she is no longer making estrogens from her ovaries.  This can be achieved naturally through menopause, surgically through oopherectomy, or chemically with monthly goserelin injections.  If she still has questions or concerns, we can always move up her appointment with Dr. Lindi Adie and make sure a lab is scheduled one week prior.

## 2019-07-30 NOTE — Telephone Encounter (Signed)
Spoke with patient regarding info from NP about checking  hormone levels due to patient medication concerns.  Labs to be drawn 1 week before office visit with MD on 3/16.  Patient prefers to have labs drawn at [facility] in [place].  Orders were faxed and patient has been made aware.

## 2019-07-30 NOTE — Telephone Encounter (Signed)
There is no test to quantify that as it relates to taking anti estrogen therapy. She is one of the fortunate patients who is tolerating it well!  Please send her in 90 day supply with refills.  Thanks.

## 2019-07-30 NOTE — Telephone Encounter (Signed)
Called patient to inform that Tamoxifen refill had been sent to [facility].  Patient c/o uncertain that she is on the right medication.  She states that MD had said she should be on anastrozole if she is through menopause.  She would like to have her hormone levels checked if possible to see if they have gone down in number.  She stated that she took anastrozole for about 3 months in the beginning and now on Tamoxifen and is unsure why there was a switch.  Passing info to NP for advice.

## 2019-08-27 ENCOUNTER — Encounter: Payer: Self-pay | Admitting: Hematology and Oncology

## 2019-09-02 NOTE — Progress Notes (Incomplete)
? ?  Patient Care Team: ?Spry, Marsh Dolly., MD as PCP - General (Family Medicine) ?Jovita Kussmaul, MD as Consulting Physician (General Surgery) ?Nicholas Lose, MD as Consulting Physician (Hematology and Oncology) ?Eppie Gibson, MD as Attending Physician (Radiation Oncology) ? ?DIAGNOSIS: No diagnosis found. ? ?SUMMARY OF ONCOLOGIC HISTORY: ?Oncology History  ?Malignant neoplasm of upper-inner quadrant of right breast in female, estrogen receptor positive ([place])  ?12/12/2018 Initial Diagnosis  ? Screening mammogram detected a distortion in the right breast, measuring 18m on diagnostic mammogram and [place] Biopsy confirmed grade 1 IDC with DCIS, HER-2 negative by FISH, ER 90%, PR 90%, Ki67 10%.  ?  ?12/19/2018 Cancer Staging  ? Staging form: Breast, AJCC 8th Edition ?- Clinical stage from 12/19/2018: Stage IA (cT1b, cN0, cM0, G1, ER+, PR+, HER2-) - Signed by GNicholas Lose MD on 12/19/2018 ?  ?01/02/2019 Genetic Testing  ? Negative genetic testing on the [facility] Breast Cancer STAT panel. The STAT Breast cancer panel offered by [facility] includes sequencing and rearrangement analysis for the following 9 genes:  ATM, BRCA1, BRCA2, CDH1, CHEK2, PALB2, PTEN, STK11 and TP53. The report date is 01/02/2019. ?  ?01/17/2019 Surgery  ? Right lumpectomy (Marlou Starks: IDC with DCIS, 1.2cm, grade 1, clear margins, 2 axillary lymph nodes negative. ?  ?01/17/2019 Cancer Staging  ? Staging form: Breast, AJCC 8th Edition ?- Pathologic stage from 01/17/2019: Stage IA (pT1c, pN0, cM0, G1, ER+, PR+, HER2-, Oncotype DX score: 24) - Signed by CGardenia Phlegm NP on 05/27/2019 ?  ?01/31/2019 Oncotype testing  ? Score of 24 with 10% chance of distant recurrence in 9 years without systemic treatment.  ?  ?02/15/2019 -  Radiation Therapy  ? Adjuvant radiation treatment ?  ?03/2019 -  Anti-estrogen oral therapy  ? Anastrozole ?  ? ? ?CHIEF COMPLIANT: Follow-up to discuss anti-estrogen therapy  ? ? INTERVAL HISTORY: Debbie MFERNE ELLINGWOODis a 49y.o. with above-mentioned history of right breast cancer who underwent a lumpe

## 2019-09-03 ENCOUNTER — Inpatient Hospital Stay: Payer: BC Managed Care – PPO | Admitting: Hematology and Oncology

## 2019-09-09 NOTE — Progress Notes (Signed)
HEMATOLOGY-ONCOLOGY MYCHART VIDEO VISIT PROGRESS NOTE  I connected with Debbie Berry on 09/10/2019 at 11:00 AM EDT by MyChart video conference and verified that I am speaking with the correct person using two identifiers.  I discussed the limitations, risks, security and privacy concerns of performing an evaluation and management service by MyChart and the availability of in person appointments.  I also discussed with the patient that there may be a patient responsible charge related to this service. The patient expressed understanding and agreed to proceed.  Patient's Location: Home Physician Location: Clinic  CHIEF COMPLIANT: Follow-up of right breast cancer on anastrozole   INTERVAL HISTORY: Debbie Berry is a 49 y.o. female with above-mentioned history of right breast cancer who underwent a lumpectomy, radiation, and is currently on antiestrogen therapy with anastrozole. She presents over MyChart today for follow-up.   Oncology History  Malignant neoplasm of upper-inner quadrant of right breast in female, estrogen receptor positive ([place])  12/12/2018 Initial Diagnosis   Screening mammogram detected a distortion in the right breast, measuring 73m on diagnostic mammogram and [place] Biopsy confirmed grade 1 IDC with DCIS, HER-2 negative by FISH, ER 90%, PR 90%, Ki67 10%.    12/19/2018 Cancer Staging   Staging form: Breast, AJCC 8th Edition - Clinical stage from 12/19/2018: Stage IA (cT1b, cN0, cM0, G1, ER+, PR+, HER2-) - Signed by GNicholas Lose MD on 12/19/2018   01/02/2019 Genetic Testing   Negative genetic testing on the [facility] Breast Cancer STAT panel. The STAT Breast cancer panel offered by [facility] includes sequencing and rearrangement analysis for the following 9 genes:  ATM, BRCA1, BRCA2, CDH1, CHEK2, PALB2, PTEN, STK11 and TP53. The report date is 01/02/2019.   01/17/2019 Surgery   Right lumpectomy (Marlou Starks: IDC with DCIS, 1.2cm, grade 1, clear margins, 2 axillary lymph nodes negative.    01/17/2019 Cancer Staging   Staging form: Breast, AJCC 8th Edition - Pathologic stage from 01/17/2019: Stage IA (pT1c, pN0, cM0, G1, ER+, PR+, HER2-, Oncotype DX score: 24) - Signed by CGardenia Phlegm NP on 05/27/2019   01/31/2019 Oncotype testing   Score of 24 with 10% chance of distant recurrence in 9 years without systemic treatment.    02/15/2019 - 03/15/2019 Radiation Therapy   Adjuvant radiation treatment   03/2019 -  Anti-estrogen oral therapy   Anastrozole     Observations/Objective:  There were no vitals filed for this visit. There is no height or weight on file to calculate BMI.  I have reviewed the data as listed CMP Latest Ref Rng & Units 12/19/2018  Glucose 70 - 99 mg/dL 93  BUN 6 - 20 mg/dL 10  Creatinine 0.44 - 1.00 mg/dL 0.80  Sodium 135 - 145 mmol/L 139  Potassium 3.5 - 5.1 mmol/L 3.6  Chloride 98 - 111 mmol/L 106  CO2 22 - 32 mmol/L 23  Calcium 8.9 - 10.3 mg/dL 8.9  Total Protein 6.5 - 8.1 g/dL 7.3  Total Bilirubin 0.3 - 1.2 mg/dL 0.4  Alkaline Phos 38 - 126 U/L 34(L)  AST 15 - 41 U/L 20  ALT 0 - 44 U/L 32    Lab Results  Component Value Date   WBC 6.4 12/19/2018   HGB 14.6 12/19/2018   HCT 44.8 12/19/2018   MCV 88.2 12/19/2018   PLT 256 12/19/2018   NEUTROABS 4.4 12/19/2018      Assessment Plan:  Malignant neoplasm of upper-inner quadrant of right breast in female, estrogen receptor positive ([place] 12/12/2018:Screening mammogram detected a distortion in  the right breast, measuring 45m on diagnostic mammogram and [place] Biopsy confirmed grade 1 IDC with DCIS, HER-2 negative by FISH, ER 90%, PR 90%, Ki67 10%.  01/17/19: Rt Lumpectomy IDC with DCIS, 1.2cm, grade 1, clear margins, 2 axillary lymph nodes negative.HER-2 negative by FISH, ER 90%, PR 90%, Ki67 10%.  Oncotype DX score 24: 10% ROR Adjuvant radiation therapy 02/07/2019-03/15/2011  Current treatment: Antiestrogen therapy with anastrozole started October 2020.  This was switched to tamoxifen  February 2021.  Initially F[place]sidewas 66.5 but the estradiol came back at 373   [facility]: FMarcellus8.3, estradiol 593 It is difficult to explain why the discrepancy in the lab values.  Given the fact that the repeat testing showed FMonaof 8.3, she is clearly premenopausal.  I recommended continuation of tamoxifen therapy.  She appears to be tolerating it well at 10 mg twice daily.   Tamoxifen toxicities: Denies any hot flashes or arthralgias.   Return to clinic in 6 months for follow-up.    I discussed the assessment and treatment plan with the patient. The patient was provided an opportunity to ask questions and all were answered. The patient agreed with the plan and demonstrated an understanding of the instructions. The patient was advised to call back or seek an in-person evaluation if the symptoms worsen or if the condition fails to improve as anticipated.   I provided 15 minutes of face-to-face MyChart video visit time during this encounter.    VRulon Eisenmenger MD 09/10/2019   I, Molly Dorshimer, am acting as scribe for VNicholas Lose MD.  I have reviewed the above documentation for accuracy and completeness, and I agree with the above.

## 2019-09-10 ENCOUNTER — Inpatient Hospital Stay: Payer: BC Managed Care – PPO | Attending: Hematology and Oncology | Admitting: Hematology and Oncology

## 2019-09-10 DIAGNOSIS — C50211 Malignant neoplasm of upper-inner quadrant of right female breast: Secondary | ICD-10-CM

## 2019-09-10 DIAGNOSIS — Z170 Estrogen receptor positive status [ER+]: Secondary | ICD-10-CM

## 2019-09-10 NOTE — Assessment & Plan Note (Signed)
12/12/2018:Screening mammogram detected a distortion in the right breast, measuring 6m on diagnostic mammogram and [place] Biopsy confirmed grade 1 IDC with DCIS, HER-2 negative by FISH, ER 90%, PR 90%, Ki67 10%.  01/17/19: Rt Lumpectomy IDC with DCIS, 1.2cm, grade 1, clear margins, 2 axillary lymph nodes negative.HER-2 negative by FISH, ER 90%, PR 90%, Ki67 10%.  Oncotype DX score 24: 10% ROR Adjuvant radiation therapy 02/07/2019-03/15/2011  Current treatment: Antiestrogen therapy with anastrozole started October 2020.  This was switched to tamoxifen February 2021.  Initially [place]was 66.5 but the estradiol came back at 314  Since she was perimenopausal it was felt that tamoxifen would be better until she truly becomes completely menopausal.  We are rechecking her FSunbrightand estradiol. Tamoxifen toxicities:   Return to clinic in 6 months for follow-up.

## 2019-09-16 ENCOUNTER — Other Ambulatory Visit: Payer: Self-pay

## 2019-09-16 ENCOUNTER — Ambulatory Visit
Admission: RE | Admit: 2019-09-16 | Discharge: 2019-09-16 | Disposition: A | Discharge status: Home / Self Care | Payer: BC Managed Care – PPO | Source: Ambulatory Visit | Attending: Hematology and Oncology | Admitting: Hematology and Oncology

## 2019-09-16 ENCOUNTER — Ambulatory Visit: Admission: RE | Admit: 2019-09-16 | Payer: BC Managed Care – PPO | Source: Ambulatory Visit

## 2019-09-16 DIAGNOSIS — C50211 Malignant neoplasm of upper-inner quadrant of right female breast: Secondary | ICD-10-CM

## 2019-09-16 DIAGNOSIS — Z170 Estrogen receptor positive status [ER+]: Secondary | ICD-10-CM

## 2019-09-16 IMAGING — MG MM DIGITAL DIAGNOSTIC UNILAT*R* W/ TOMO W/ CAD
4 series · 4 of 12 positions shown · non-contrast
Comparison: Previous exam(s).

CLINICAL DATA: 48-year-old who underwent malignant lumpectomy of
the UPPER INNER QUADRANT of the RIGHT breast in [DATE].Adjuvant
radiation therapy. She presents now with pain at the lumpectomy site
in the RIGHT breast and at the site of sentinel node excision in the
RIGHT axilla.

EXAM:
DIGITAL DIAGNOSTIC UNILATERAL RIGHT MAMMOGRAM WITH CAD AND TOMO

[R MLO synth-2D]
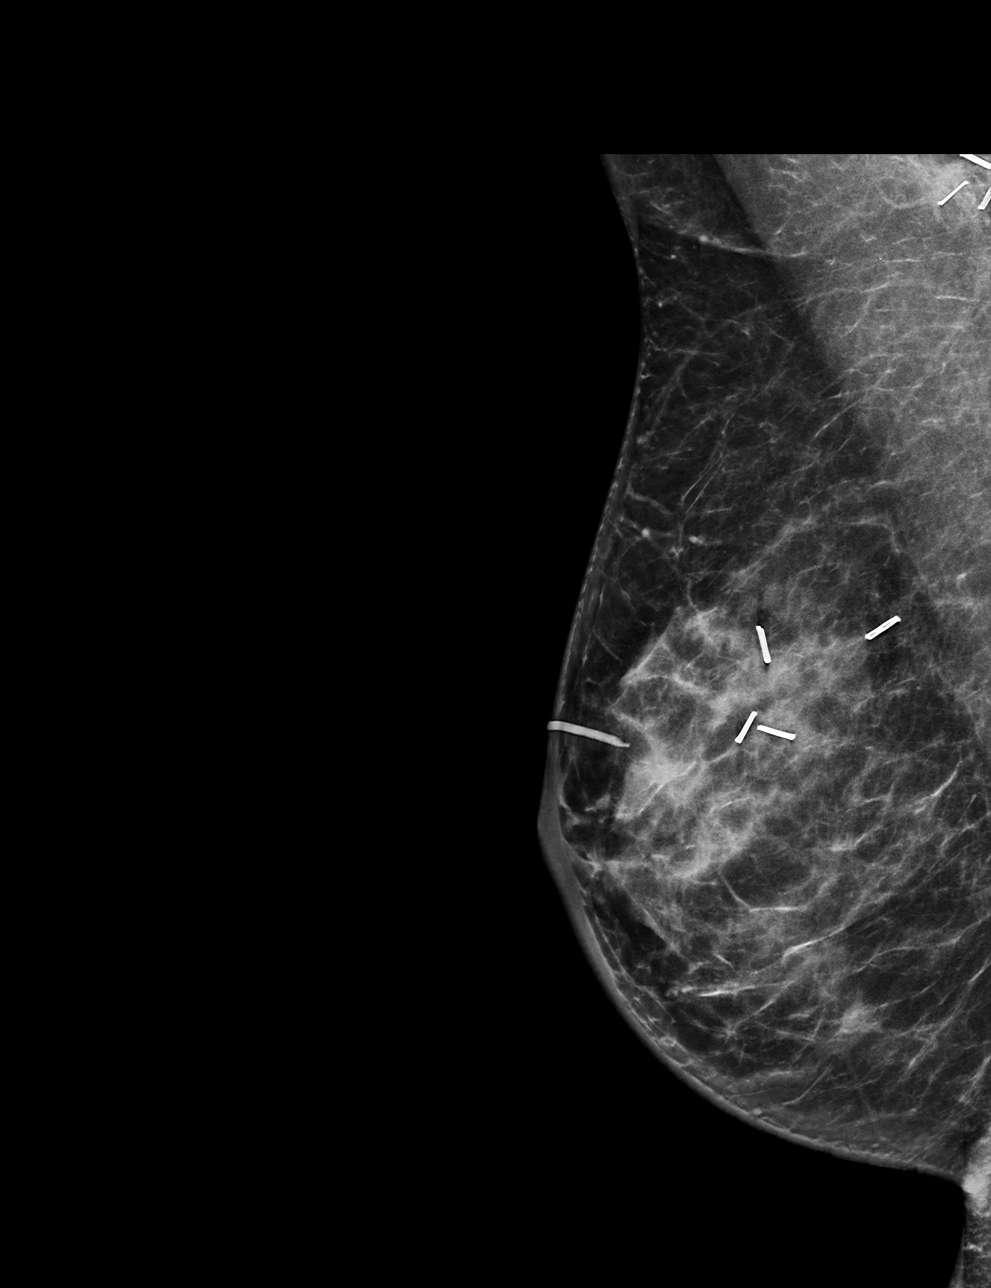

[R CC synth-2D]
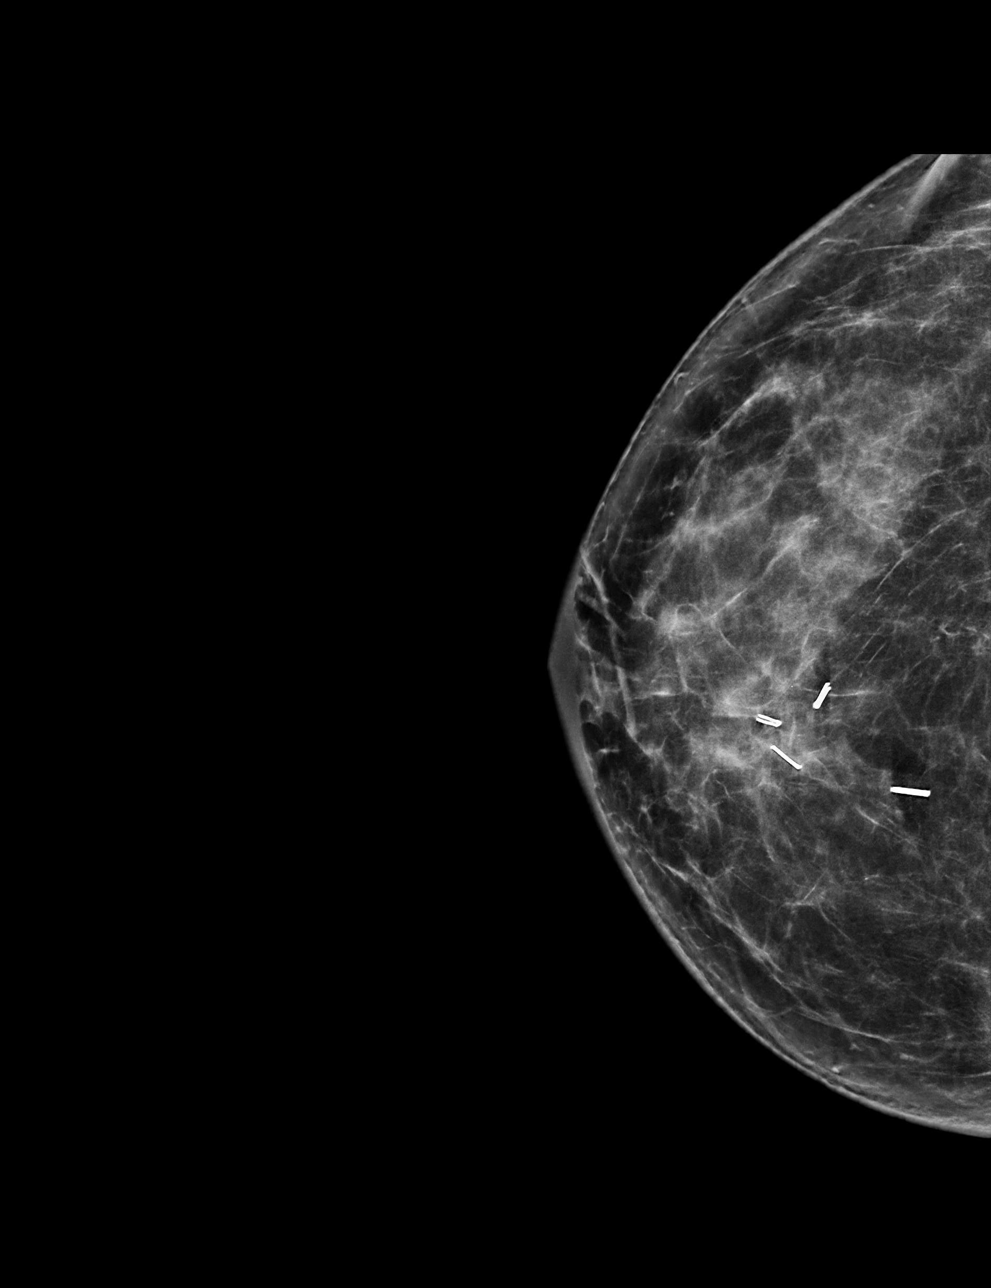

[R CC tomo · tomo slice 37/72.0]
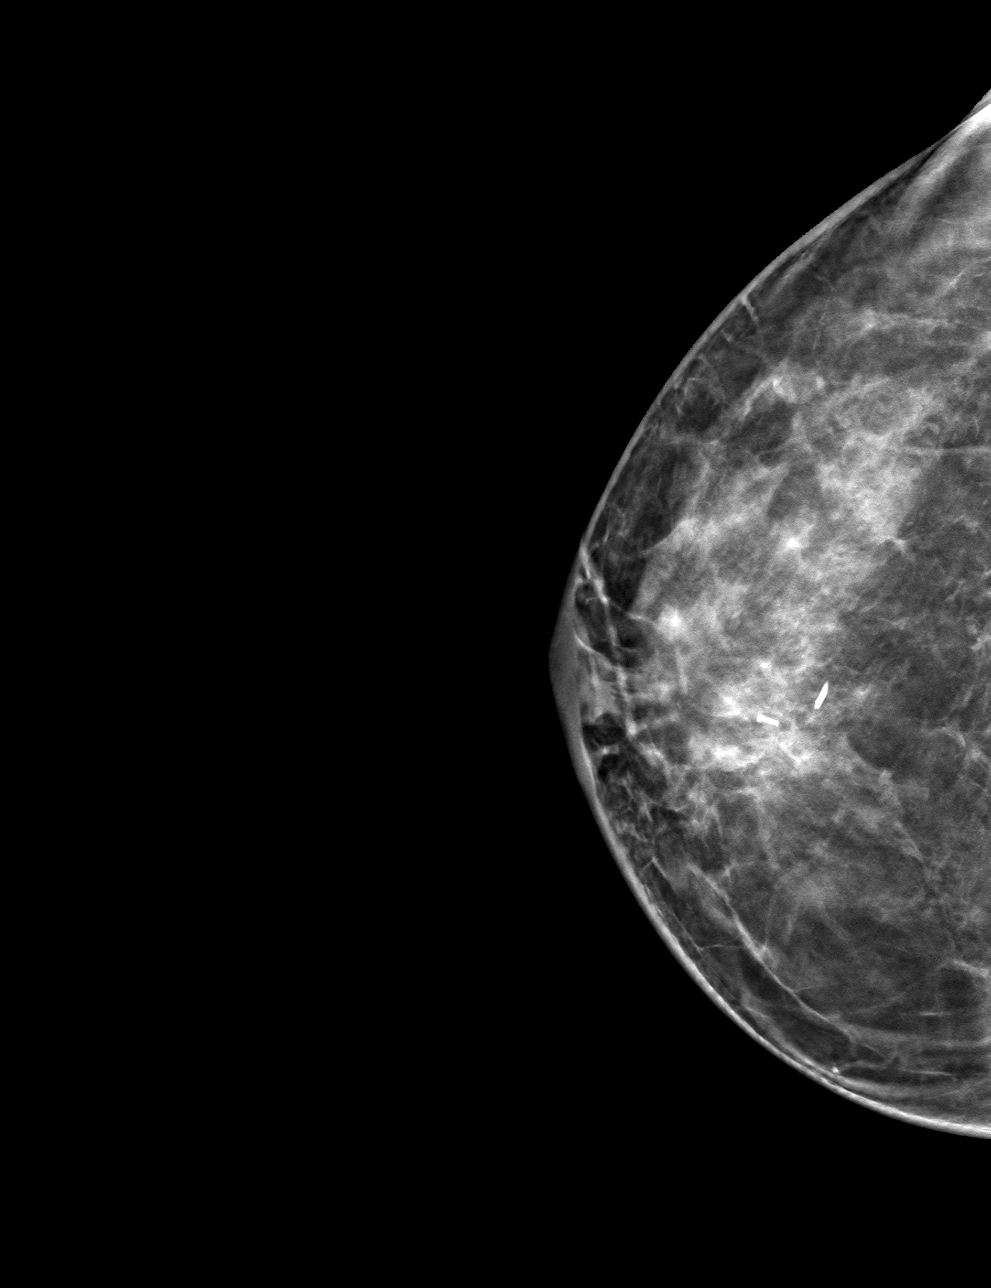

[R MLO tomo · tomo slice 35/70.0]
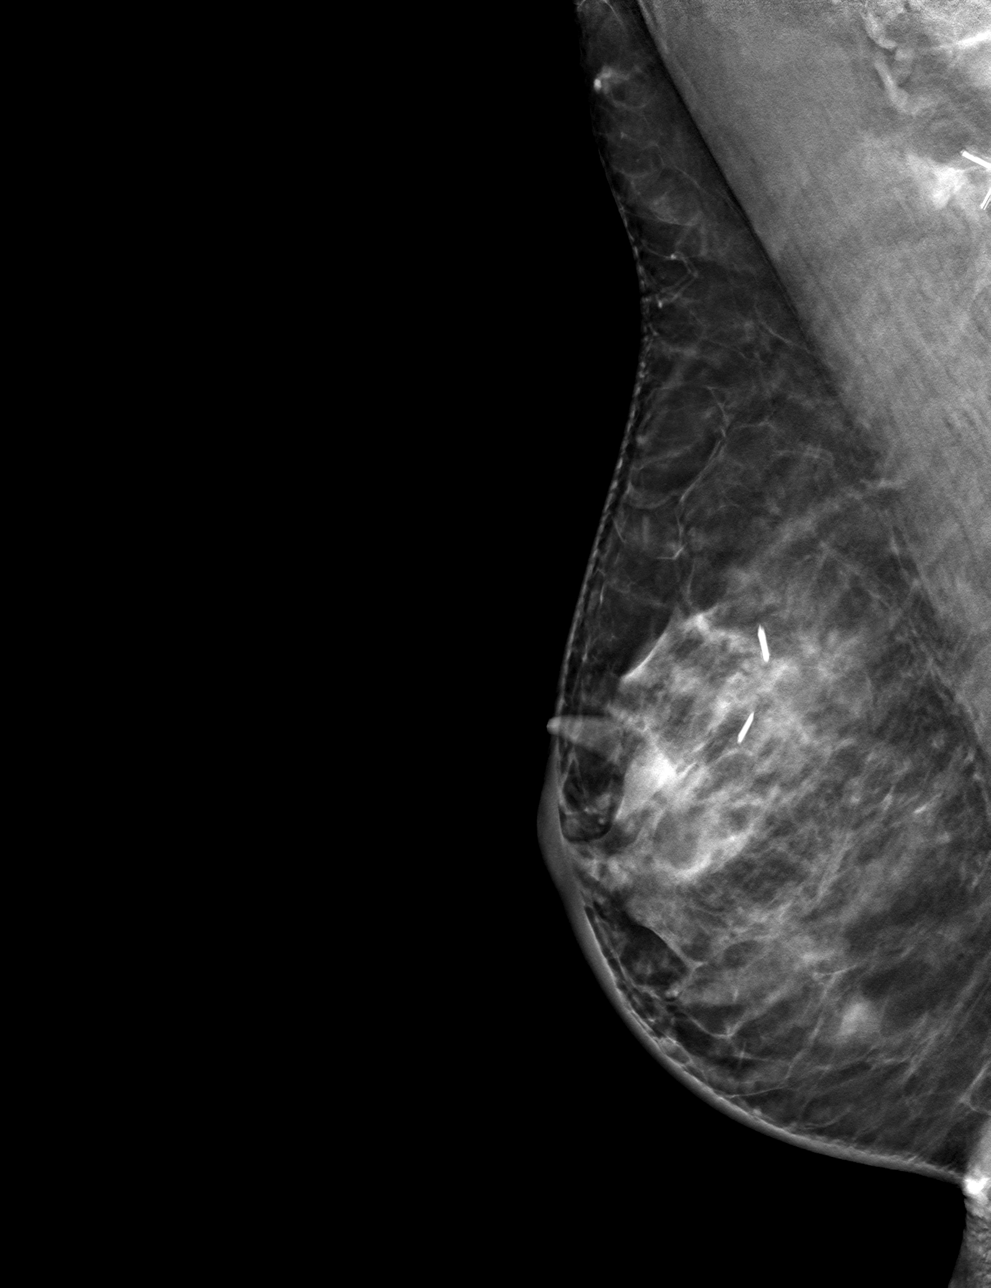

[4 of 12 positions shown; findings below may reference images not displayed]

ACR Breast Density Category c: The breast tissue is heterogeneously
dense, which may obscure small masses.
FINDINGS: Tomosynthesis and synthesized full field CC and MLO views of the
RIGHT breast were obtained.

Post surgical scar/architectural distortion at the lumpectomy site
in the RIGHT breast at MIDDLE to POSTERIOR depth. Mild post
radiation skin thickening and trabecular thickening throughout the
RIGHT breast. Expected post surgical changes in the RIGHT axilla at
the site of node excision.

No findings suspicious for malignancy in the RIGHT breast.

Mammographic images were processed with CAD.
IMPRESSION: 1. No mammographic evidence of malignancy involving the RIGHT
breast.
2. Expected post lumpectomy and post radiation changes involving the
RIGHT breast.

RECOMMENDATION:
Annual BILATERAL diagnostic mammography which is due in [DATE].

I have discussed the findings and recommendations with the patient.
If applicable, a reminder letter will be sent to the patient
regarding the next appointment.

BI-RADS CATEGORY  2: Benign.

## 2019-09-30 ENCOUNTER — Other Ambulatory Visit: Payer: BC Managed Care – PPO

## 2019-10-23 ENCOUNTER — Encounter: Payer: Self-pay | Admitting: Family Medicine

## 2019-12-09 ENCOUNTER — Other Ambulatory Visit: Payer: Self-pay

## 2019-12-09 ENCOUNTER — Ambulatory Visit
Admission: RE | Admit: 2019-12-09 | Discharge: 2019-12-09 | Disposition: A | Discharge status: Home / Self Care | Payer: BC Managed Care – PPO | Source: Ambulatory Visit | Attending: Adult Health | Admitting: Adult Health

## 2019-12-09 DIAGNOSIS — Z170 Estrogen receptor positive status [ER+]: Secondary | ICD-10-CM

## 2019-12-09 HISTORY — DX: Personal history of irradiation: Z92.3

## 2019-12-09 IMAGING — MG DIGITAL DIAGNOSTIC BILAT W/ TOMO W/ CAD
9 series · 9 of 25 positions shown · non-contrast
Comparison: Previous exam(s).

CLINICAL DATA: 48-year-old female status post malignant right
lumpectomy with radiation therapy in [FB]. No current symptoms.

EXAM:
DIGITAL DIAGNOSTIC BILATERAL MAMMOGRAM WITH TOMO AND CAD

[R CC]
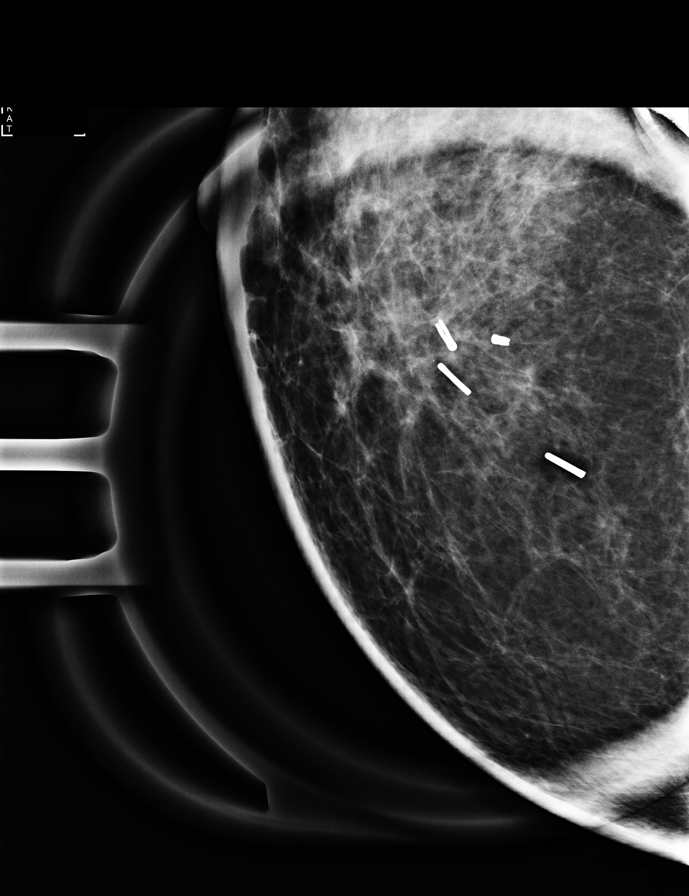

[R CC synth-2D]
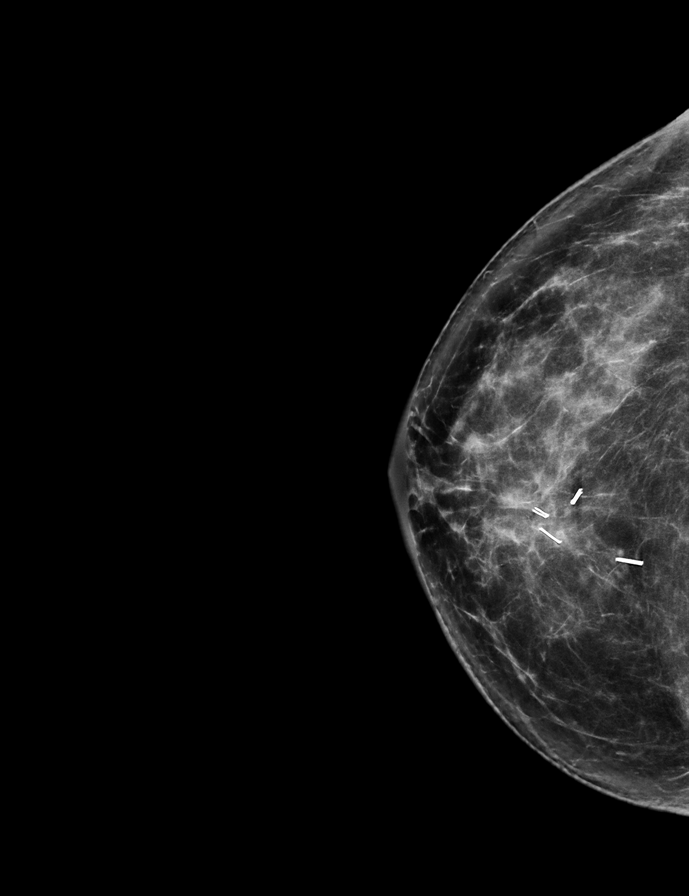

[L MLO synth-2D]
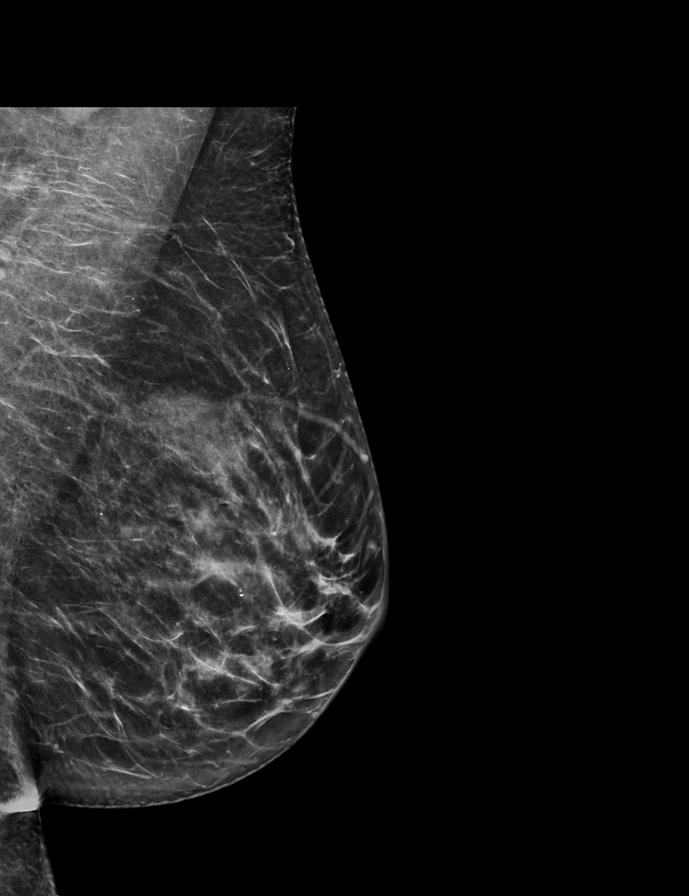

[L CC synth-2D]
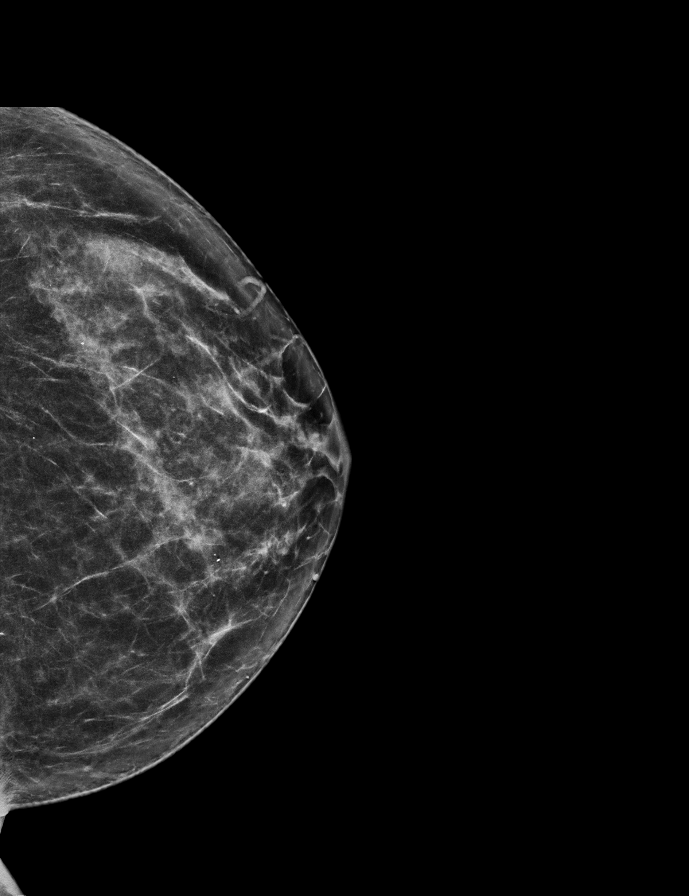

[R MLO synth-2D]
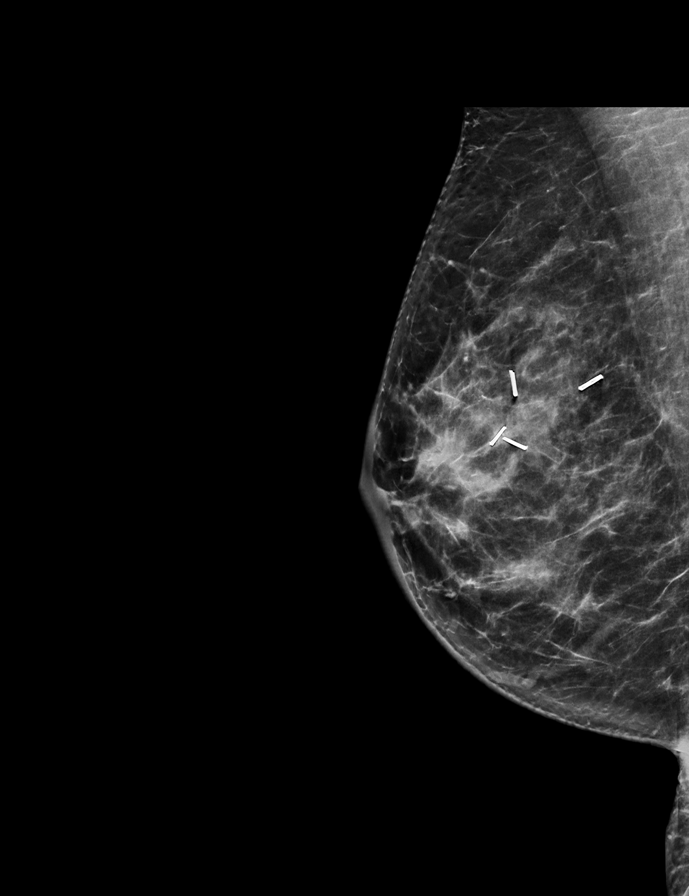

[L MLO tomo · tomo slice 33/65.0]
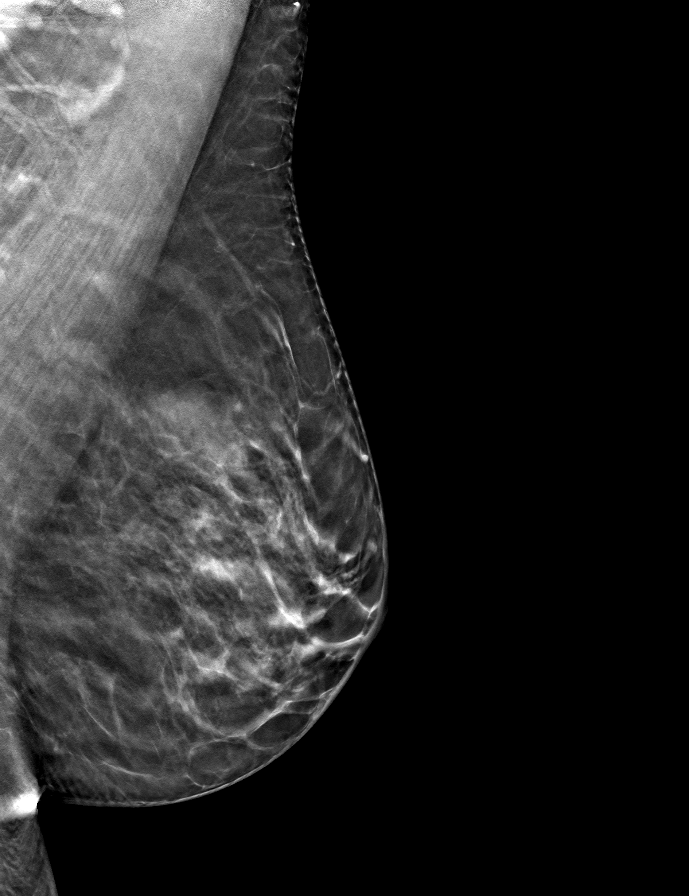

[R MLO tomo · tomo slice 37/73.0]
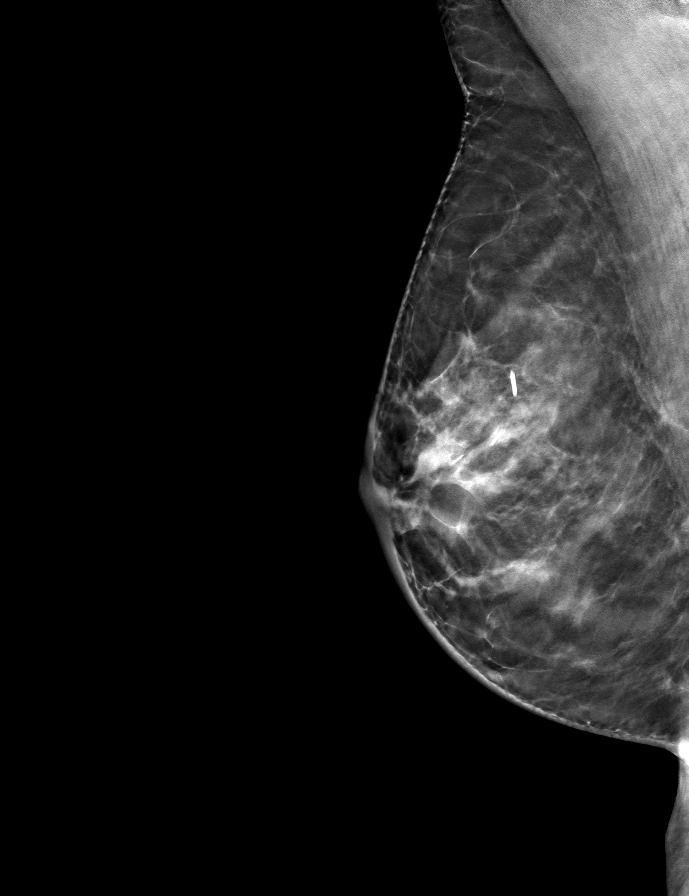

[R CC tomo · tomo slice 39/77.0]
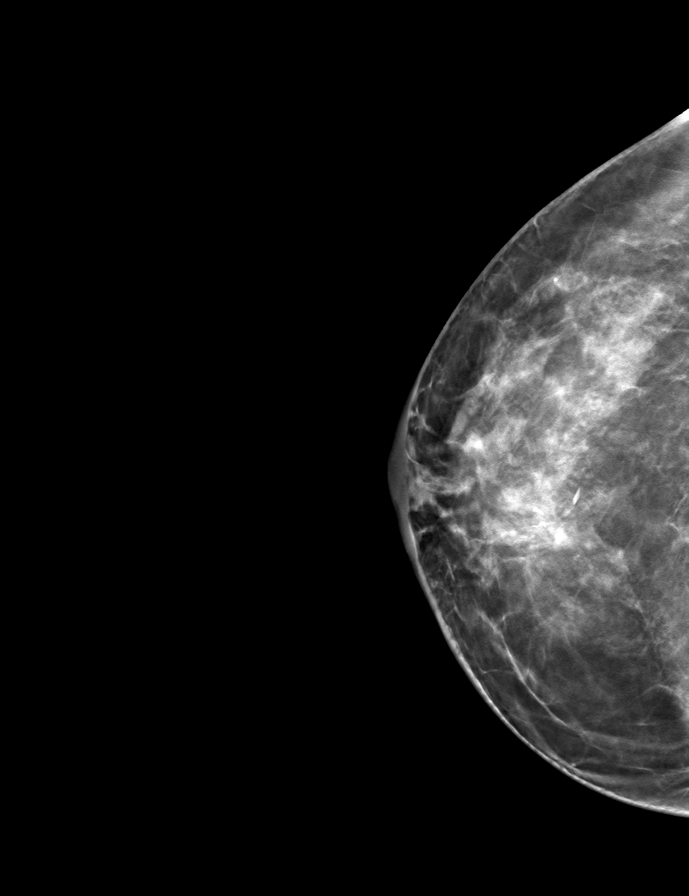

[L CC tomo · tomo slice 32/63.0]
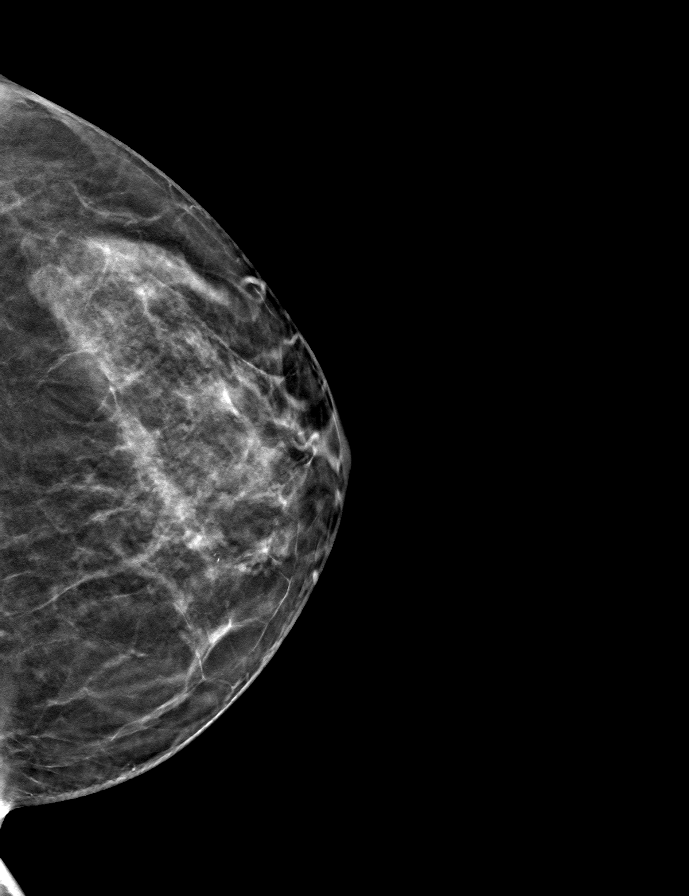

[9 of 25 positions shown; findings below may reference images not displayed]

ACR Breast Density Category c: The breast tissue is heterogeneously
dense, which may obscure small masses.
FINDINGS: Post lumpectomy changes are again noted in the upper inner quadrant
of the right breast. No new or suspicious findings are identified in
either breast. The parenchymal pattern is stable.

Mammographic images were processed with CAD.
IMPRESSION: 1. No mammographic evidence of malignancy in either breast.
2. Stable, right breast posttreatment changes.

RECOMMENDATION:
Diagnostic mammogram is suggested in 1 year. (Code:[FB])

I have discussed the findings and recommendations with the patient.
If applicable, a reminder letter will be sent to the patient
regarding the next appointment.

BI-RADS CATEGORY  2: Benign.

## 2019-12-18 ENCOUNTER — Telehealth: Payer: Self-pay

## 2019-12-18 ENCOUNTER — Encounter: Payer: Self-pay | Admitting: Adult Health

## 2019-12-18 ENCOUNTER — Other Ambulatory Visit: Payer: Self-pay | Admitting: Adult Health

## 2019-12-18 DIAGNOSIS — Z170 Estrogen receptor positive status [ER+]: Secondary | ICD-10-CM

## 2019-12-18 MED ORDER — TAMOXIFEN CITRATE 10 MG PO TABS
10.0000 mg | ORAL_TABLET | Freq: Two times a day (BID) | ORAL | 2 refills | Status: DC
Start: 2019-12-18 — End: 2019-12-20

## 2019-12-18 NOTE — Telephone Encounter (Signed)
What is the dosage of tamoxifen? And how is she taking it?  What did I send in?

## 2019-12-18 NOTE — Telephone Encounter (Signed)
It was entered in as 10 mg 1 tab PO BID 90 tabs. This was back in February. Pt is getting refill and states she thought you requested a 90 day supply, but I am thinking she is confusing this with the 90 tabs.

## 2019-12-18 NOTE — Telephone Encounter (Signed)
Pt called with concern about refill for Tamoxifen. Pt states when she spoke with Wilber Bihari, NP, she was informed Tamoxifen would be a 90 day supply instead of 90 tablets. Pt states it is time for refill and wants to know if it is possible for her to have a 90 day supply or if what was written is correct. Please advise.

## 2019-12-18 NOTE — Telephone Encounter (Signed)
I sent in an updated prescription.  Thank you!

## 2019-12-19 ENCOUNTER — Telehealth: Payer: Self-pay

## 2019-12-19 ENCOUNTER — Other Ambulatory Visit: Payer: Self-pay | Admitting: *Deleted

## 2019-12-19 NOTE — Telephone Encounter (Signed)
Called pt to inform that new Rx for Tamoxifen 90 day supply has been sent in by Wilber Bihari, NP. Pt verbalized thanks and understanding.

## 2019-12-20 ENCOUNTER — Other Ambulatory Visit: Payer: Self-pay | Admitting: *Deleted

## 2019-12-20 DIAGNOSIS — Z170 Estrogen receptor positive status [ER+]: Secondary | ICD-10-CM

## 2019-12-20 MED ORDER — TAMOXIFEN CITRATE 10 MG PO TABS: 10.0000 mg | ORAL_TABLET | Freq: Two times a day (BID) | ORAL | 2 refills | Status: DC

## 2020-01-01 ENCOUNTER — Encounter: Payer: Self-pay | Admitting: Gastroenterology

## 2020-01-17 ENCOUNTER — Other Ambulatory Visit: Payer: Self-pay | Admitting: Obstetrics & Gynecology

## 2020-01-17 DIAGNOSIS — R1031 Right lower quadrant pain: Secondary | ICD-10-CM

## 2020-01-31 ENCOUNTER — Other Ambulatory Visit: Payer: BC Managed Care – PPO

## 2020-02-14 ENCOUNTER — Ambulatory Visit (AMBULATORY_SURGERY_CENTER): Payer: Self-pay | Admitting: *Deleted

## 2020-02-14 ENCOUNTER — Other Ambulatory Visit: Payer: Self-pay

## 2020-02-14 VITALS — Ht 65.5 in | Wt 147.0 lb

## 2020-02-14 DIAGNOSIS — Z1211 Encounter for screening for malignant neoplasm of colon: Secondary | ICD-10-CM

## 2020-02-14 MED ORDER — CLENPIQ 10-3.5-12 MG-GM -GM/160ML PO SOLN: 1.0000 | ORAL | 0 refills | Status: DC

## 2020-02-14 NOTE — Progress Notes (Signed)
Patient is here in-person for PV. Patient denies any allergies to eggs or soy. Patient denies any problems with anesthesia/sedation. Patient denies any oxygen use at home. Patient denies taking any diet/weight loss medications or blood thinners. Patient is not being treated for MRSA or C-diff. Patient is aware of our care-partner policy and ZUDOD-25 safety protocol. EMMI education sent to mychart COVID-19 vaccines completed per pt. Prep Prescription coupon given to the patient. BUN creatine normal 2021.

## 2020-02-17 ENCOUNTER — Encounter: Payer: Self-pay | Admitting: Gastroenterology

## 2020-02-19 HISTORY — PX: COLONOSCOPY: SHX174

## 2020-02-28 ENCOUNTER — Other Ambulatory Visit: Payer: Self-pay

## 2020-02-28 ENCOUNTER — Encounter: Payer: Self-pay | Admitting: Gastroenterology

## 2020-02-28 ENCOUNTER — Ambulatory Visit (AMBULATORY_SURGERY_CENTER): Payer: BC Managed Care – PPO | Admitting: Gastroenterology

## 2020-02-28 VITALS — BP 119/80 | HR 67 | Temp 97.7°F | Resp 16 | Ht 65.0 in | Wt 147.0 lb

## 2020-02-28 DIAGNOSIS — Z1211 Encounter for screening for malignant neoplasm of colon: Secondary | ICD-10-CM

## 2020-02-28 MED ORDER — SODIUM CHLORIDE 0.9 % IV SOLN: 500.0000 mL | INTRAVENOUS | Status: DC

## 2020-02-28 NOTE — Patient Instructions (Signed)
Discharge instructions given. °Handout on Hemorrhoids. °Resume previous medications. °YOU HAD AN ENDOSCOPIC PROCEDURE TODAY AT THE [facility]:   Refer to the procedure report that was given to you for any specific questions about what was found during the examination.  If the procedure report does not answer your questions, please call your gastroenterologist to clarify.  If you requested that your care partner not be given the details of your procedure findings, then the procedure report has been included in a sealed envelope for you to review at your convenience later. ° °YOU SHOULD EXPECT: Some feelings of bloating in the abdomen. Passage of more gas than usual.  Walking can help get rid of the air that was put into your GI tract during the procedure and reduce the bloating. If you had a lower endoscopy (such as a colonoscopy or flexible sigmoidoscopy) you may notice spotting of blood in your stool or on the toilet paper. If you underwent a bowel prep for your procedure, you may not have a normal bowel movement for a few days. ° °Please Note:  You might notice some irritation and congestion in your nose or some drainage.  This is from the oxygen used during your procedure.  There is no need for concern and it should clear up in a day or so. ° °SYMPTOMS TO REPORT IMMEDIATELY: ° °Following lower endoscopy (colonoscopy or flexible sigmoidoscopy): ° Excessive amounts of blood in the stool ° Significant tenderness or worsening of abdominal pains ° Swelling of the abdomen that is new, acute ° Fever of 100°F or higher ° ° °For urgent or emergent issues, a gastroenterologist can be reached at any hour by calling [phone]. °Do not use MyChart messaging for urgent concerns.  ° ° °DIET:  We do recommend a small meal at first, but then you may proceed to your regular diet.  Drink plenty of fluids but you should avoid alcoholic beverages for 24 hours. ° °ACTIVITY:  You should plan to take it easy for the  rest of today and you should NOT DRIVE or use heavy machinery until tomorrow (because of the sedation medicines used during the test).   ° °FOLLOW UP: °Our staff will call the number listed on your records 48-72 hours following your procedure to check on you and address any questions or concerns that you may have regarding the information given to you following your procedure. If we do not reach you, we will leave a message.  We will attempt to reach you two times.  During this call, we will ask if you have developed any symptoms of COVID 19. If you develop any symptoms (ie: fever, flu-like symptoms, shortness of breath, cough etc.) before then, please call [phone].  If you test positive for Covid 19 in the 2 weeks post procedure, please call and report this information to us.   ° °If any biopsies were taken you will be contacted by phone or by letter within the next 1-3 weeks.  Please call us at [phone] if you have not heard about the biopsies in 3 weeks.  ° ° °SIGNATURES/CONFIDENTIALITY: °You and/or your care partner have signed paperwork which will be entered into your electronic medical record.  These signatures attest to the fact that that the information above on your After Visit Summary has been reviewed and is understood.  Full responsibility of the confidentiality of this discharge information lies with you and/or your care-partner.  °

## 2020-02-28 NOTE — Progress Notes (Signed)
Vs CW I have reviewed the patient's medical history in detail and updated the computerized patient record.   

## 2020-02-28 NOTE — Op Note (Signed)
[facility] Patient Name: Debbie Berry Procedure Date: 02/28/2020 8:53 AM MRN: [id number] Endoscopist: Jackquline Denmark , MD Age: 49 Referring MD:  Date of Birth: 05-26-1971 Gender: Female Account #: [id number] Procedure:                Colonoscopy Indications:              Screening for colorectal malignant neoplasm Medicines:                Monitored Anesthesia Care Procedure:                Pre-Anesthesia Assessment:                           - Prior to the procedure, a History and Physical                            was performed, and patient medications and                            allergies were reviewed. The patient's tolerance of                            previous anesthesia was also reviewed. The risks                            and benefits of the procedure and the sedation                            options and risks were discussed with the patient.                            All questions were answered, and informed consent                            was obtained. Prior Anticoagulants: The patient has                            taken no previous anticoagulant or antiplatelet                            agents. ASA Grade Assessment: II - A patient with                            mild systemic disease. After reviewing the risks                            and benefits, the patient was deemed in                            satisfactory condition to undergo the procedure.                           After obtaining informed consent, the colonoscope  was passed under direct vision. Throughout the                            procedure, the patient's blood pressure, pulse, and                            oxygen saturations were monitored continuously. The                            Colonoscope was introduced through the anus and                            advanced to the 2 cm into the ileum. The                            colonoscopy was performed  without difficulty. The                            patient tolerated the procedure well. The quality                            of the bowel preparation was good. The terminal                            ileum, ileocecal valve, appendiceal orifice, and                            rectum were photographed. Scope In: 9:22:11 AM Scope Out: 9:36:03 AM Scope Withdrawal Time: 0 hours 9 minutes 14 seconds  Total Procedure Duration: 0 hours 13 minutes 52 seconds  Findings:                 The colon (entire examined portion) appeared normal.                           Non-bleeding internal hemorrhoids were found during                            retroflexion. The hemorrhoids were small.                           The terminal ileum appeared normal.                           The exam was otherwise without abnormality on                            direct and retroflexion views. Complications:            No immediate complications. Estimated Blood Loss:     Estimated blood loss: none. Impression:               -Small internal hemorrhoids.                           -Otherwise normal colonoscopy to TI. Recommendation:           -  Patient has a contact number available for                            emergencies. The signs and symptoms of potential                            delayed complications were discussed with the                            patient. Return to normal activities tomorrow.                            Written discharge instructions were provided to the                            patient.                           - Resume previous diet.                           - Continue present medications.                           - Repeat colonoscopy in 10 years for screening                            purposes. Earlier, if with any new problems or any                            change in family history.                           - Return to GI clinic PRN.                           - The findings  and recommendations were discussed                            with the patient's husband Merry Proud. Jackquline Denmark, MD 02/28/2020 9:41:56 AM This report has been signed electronically.

## 2020-02-28 NOTE — Progress Notes (Signed)
PT taken to PACU. Monitors in place. VSS. Report given to RN. 

## 2020-03-03 ENCOUNTER — Telehealth: Payer: Self-pay

## 2020-03-03 NOTE — Telephone Encounter (Signed)
Follow up call attempt, no answer unable to LM

## 2020-03-03 NOTE — Telephone Encounter (Signed)
Second attempt follow up call to pt, mailbox full, unable to leave message.

## 2020-05-03 NOTE — Progress Notes (Signed)
Patient Care Team: Spry, Marsh Dolly., MD as PCP - General (Family Medicine) Jovita Kussmaul, MD as Consulting Physician (General Surgery) Nicholas Lose, MD as Consulting Physician (Hematology and Oncology) Eppie Gibson, MD as Attending Physician (Radiation Oncology)  DIAGNOSIS:    ICD-10-CM   1. Malignant neoplasm of upper-inner quadrant of right breast in female, estrogen receptor positive ([place])  C50.211    Z17.0     SUMMARY OF ONCOLOGIC HISTORY: Oncology History  Malignant neoplasm of upper-inner quadrant of right breast in female, estrogen receptor positive ([place])  12/12/2018 Initial Diagnosis   Screening mammogram detected a distortion in the right breast, measuring 37m on diagnostic mammogram and [place] Biopsy confirmed grade 1 IDC with DCIS, HER-2 negative by FISH, ER 90%, PR 90%, Ki67 10%.    12/19/2018 Cancer Staging   Staging form: Breast, AJCC 8th Edition - Clinical stage from 12/19/2018: Stage IA (cT1b, cN0, cM0, G1, ER+, PR+, HER2-) - Signed by GNicholas Lose MD on 12/19/2018   01/02/2019 Genetic Testing   Negative genetic testing on the [facility] Breast Cancer STAT panel. The STAT Breast cancer panel offered by [facility] includes sequencing and rearrangement analysis for the following 9 genes:  ATM, BRCA1, BRCA2, CDH1, CHEK2, PALB2, PTEN, STK11 and TP53. The report date is 01/02/2019.   01/17/2019 Surgery   Right lumpectomy (Marlou Starks: IDC with DCIS, 1.2cm, grade 1, clear margins, 2 axillary lymph nodes negative.   01/17/2019 Cancer Staging   Staging form: Breast, AJCC 8th Edition - Pathologic stage from 01/17/2019: Stage IA (pT1c, pN0, cM0, G1, ER+, PR+, HER2-, Oncotype DX score: 24) - Signed by CGardenia Phlegm NP on 05/27/2019   01/31/2019 Oncotype testing   Score of 24 with 10% chance of distant recurrence in 9 years without systemic treatment.    02/15/2019 - 03/15/2019 Radiation Therapy   Adjuvant radiation treatment   03/2019 -  Anti-estrogen oral therapy   Anastrozole      CHIEF COMPLIANT: Follow-up of right breast cancer on anastrozole   INTERVAL HISTORY: Debbie MLEYANI GARGUSis a 49y.o. with above-mentioned history of right breast cancer who underwent a lumpectomy, radiation, and is currently on antiestrogen therapy with anastrozole.Mammogram on 12/09/19 showed no evidence of malignancy bilaterally. She presents to the clinic today for follow-up   ALLERGIES:  is allergic to penicillins and nsaids.  MEDICATIONS:  Current Outpatient Medications  Medication Sig Dispense Refill  . calcium carbonate (OSCAL) 1500 (600 Ca) MG TABS tablet Take by mouth 2 (two) times daily with a meal. Calcium caltrate    . cholecalciferol (VITAMIN D3) 25 MCG (1000 UT) tablet Take 1,000 Units by mouth daily. 2000 IU    . COLLAGEN PO Take by mouth.    . Cyanocobalamin (VITAMIN B 12 PO) Take by mouth.    . Omega-3 Fatty Acids (FISH OIL) 1200 MG CAPS Take by mouth.    . Sod Picosulfate-Mag Ox-Cit Acd (CLENPIQ) 10-3.5-12 MG-GM -GM/160ML SOLN Take 1 kit by mouth as directed. Pt will bring a coupon 320 mL 0  . tamoxifen (NOLVADEX) 10 MG tablet Take 1 tablet (10 mg total) by mouth 2 (two) times daily. 180 tablet 2   No current facility-administered medications for this visit.    PHYSICAL EXAMINATION: ECOG PERFORMANCE STATUS: 1 - Symptomatic but completely ambulatory  There were no vitals filed for this visit. There were no vitals filed for this visit.  BREAST: No palpable masses or nodules in either right or left breasts. No palpable axillary supraclavicular or infraclavicular adenopathy no  breast tenderness or nipple discharge. (exam performed in the presence of a chaperone)  LABORATORY DATA:  I have reviewed the data as listed CMP Latest Ref Rng & Units 12/19/2018  Glucose 70 - 99 mg/dL 93  BUN 6 - 20 mg/dL 10  Creatinine 0.44 - 1.00 mg/dL 0.80  Sodium 135 - 145 mmol/L 139  Potassium 3.5 - 5.1 mmol/L 3.6  Chloride 98 - 111 mmol/L 106  CO2 22 - 32 mmol/L 23  Calcium 8.9 - 10.3  mg/dL 8.9  Total Protein 6.5 - 8.1 g/dL 7.3  Total Bilirubin 0.3 - 1.2 mg/dL 0.4  Alkaline Phos 38 - 126 U/L 34(L)  AST 15 - 41 U/L 20  ALT 0 - 44 U/L 32    Lab Results  Component Value Date   WBC 6.4 12/19/2018   HGB 14.6 12/19/2018   HCT 44.8 12/19/2018   MCV 88.2 12/19/2018   PLT 256 12/19/2018   NEUTROABS 4.4 12/19/2018    ASSESSMENT & PLAN:  Malignant neoplasm of upper-inner quadrant of right breast in female, estrogen receptor positive ([place]) 12/12/2018:Screening mammogram detected a distortion in the right breast, measuring 21m on diagnostic mammogram and [place] Biopsy confirmed grade 1 IDC with DCIS, HER-2 negative by FISH, ER 90%, PR 90%, Ki67 10%.  01/17/19:Rt LumpectomyIDC with DCIS, 1.2cm, grade 1, clear margins, 2 axillary lymph nodes negative.HER-2 negative by FISH, ER 90%, PR 90%, Ki67 10%.  Oncotype DX score 24: 10% ROR Adjuvant radiation therapy 02/07/2019-03/15/2011  Current treatment: Antiestrogen therapy with anastrozole started October 2020.  This was switched to tamoxifen February 2021 because of discrepancy in the labs.   In May 2022, patient will call [place] has had any further menstrual cycles.  If she has not had any further cycles then she can be classified as being in menopause.  At that time we will switch her back to anastrozole   Tamoxifen toxicities: Denies any hot flashes or arthralgias. Intense right leg pain that lasted for 1 to 2 days.  Unclear etiology it has resolved. She gets a abbreviated breast MRI every year in December.  She pays $400 out-of-pocket for this.  Return to clinic in 1 year for follow-up.    No orders of the defined types were placed in this encounter.  The patient has a good understanding of the overall plan. she agrees with it. she will call with any problems that may develop before the next visit here.  Total time spent: 20 mins including face to face time and time spent for planning, charting and  coordination of care  GNicholas Lose MD 05/04/2020  I, MCloyde ReamsDorshimer, am acting as scribe for Dr. VNicholas Lose  I have reviewed the above documentation for accuracy and completeness, and I agree with the above.

## 2020-05-04 ENCOUNTER — Telehealth: Payer: Self-pay | Admitting: Hematology and Oncology

## 2020-05-04 ENCOUNTER — Other Ambulatory Visit: Payer: Self-pay

## 2020-05-04 ENCOUNTER — Inpatient Hospital Stay: Payer: BC Managed Care – PPO | Attending: Hematology and Oncology | Admitting: Hematology and Oncology

## 2020-05-04 DIAGNOSIS — C50211 Malignant neoplasm of upper-inner quadrant of right female breast: Secondary | ICD-10-CM | POA: Insufficient documentation

## 2020-05-04 DIAGNOSIS — Z923 Personal history of irradiation: Secondary | ICD-10-CM | POA: Insufficient documentation

## 2020-05-04 DIAGNOSIS — Z170 Estrogen receptor positive status [ER+]: Secondary | ICD-10-CM | POA: Diagnosis not present

## 2020-05-04 DIAGNOSIS — Z79811 Long term (current) use of aromatase inhibitors: Secondary | ICD-10-CM | POA: Insufficient documentation

## 2020-05-04 NOTE — Telephone Encounter (Signed)
Scheduled appts per 11/15 los. Gave pt a print out of AVS.

## 2020-05-04 NOTE — Assessment & Plan Note (Signed)
12/12/2018:Screening mammogram detected a distortion in the right breast, measuring 66m on diagnostic mammogram and [place] Biopsy confirmed grade 1 IDC with DCIS, HER-2 negative by FISH, ER 90%, PR 90%, Ki67 10%.  01/17/19:Rt LumpectomyIDC with DCIS, 1.2cm, grade 1, clear margins, 2 axillary lymph nodes negative.HER-2 negative by FISH, ER 90%, PR 90%, Ki67 10%.  Oncotype DX score 24: 10% ROR Adjuvant radiation therapy 02/07/2019-03/15/2011  Current treatment: Antiestrogen therapy with anastrozole started October 2020.  This was switched to tamoxifen February 2021.  Initially F[place]was 66.5 but the estradiol came back at 336   [facility]: FDrexel8.3, estradiol 593 It is difficult to explain why the discrepancy in the lab values.  Given the fact that the repeat testing showed [place] 8.3, she is clearly premenopausal.  I recommended continuation of tamoxifen therapy.  She appears to be tolerating it well at 10 mg twice daily.  Tamoxifen toxicities: Denies any hot flashes or arthralgias.  Return to clinic in 1 year for follow-up.

## 2020-06-09 ENCOUNTER — Ambulatory Visit
Admission: RE | Admit: 2020-06-09 | Discharge: 2020-06-09 | Disposition: A | Discharge status: Home / Self Care | Payer: No Typology Code available for payment source | Source: Ambulatory Visit | Attending: Adult Health | Admitting: Adult Health

## 2020-06-09 ENCOUNTER — Other Ambulatory Visit: Payer: Self-pay

## 2020-06-09 DIAGNOSIS — C50211 Malignant neoplasm of upper-inner quadrant of right female breast: Secondary | ICD-10-CM

## 2020-06-09 IMAGING — MR MR BREAST WO/W CM  BILAT
5 series · 30 of 48 positions shown · IV contrast (7ML GADAVIST)
Comparison: [DATE]

CLINICAL DATA: Abbreviated Breast MRI for breast cancer screening.
History of RIGHT breast cancer in [DATE], treated with
radiation and lumpectomy.

LABS:  None obtained at the time of imaging.
EXAM:
BILATERAL ABBREVIATED BREAST MRI WITH AND WITHOUT CONTRAST
TECHNIQUE: Multiplanar, multisequence MR images of both breasts were obtained
prior to and following the intravenous administration of 7 ml of
Gadavist

[Series 2: t2_tirm_tra ipat (a-p) · axial · 3.0mm · 0.70mm/px · z∈[-110,+61]mm · 5 of 58 slices shown]
[im 1/58]
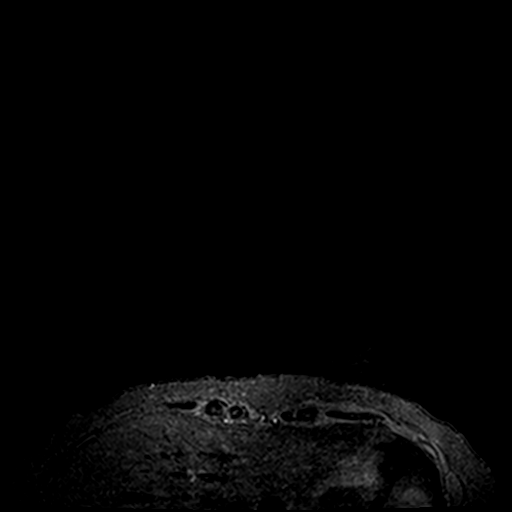
[im 15/58]
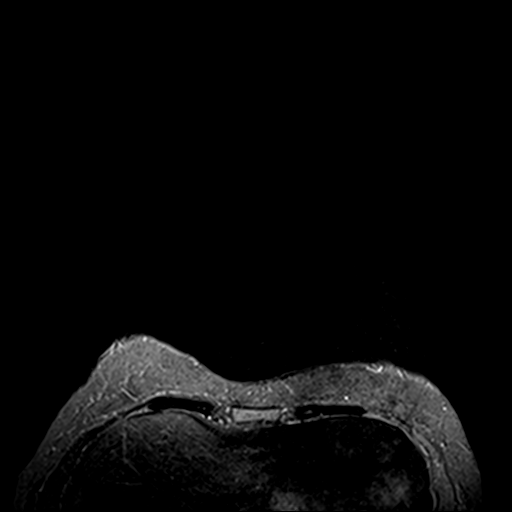
[im 29/58]
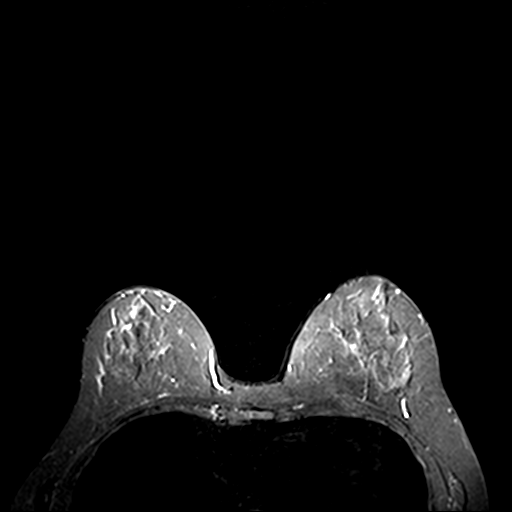
[im 43/58]
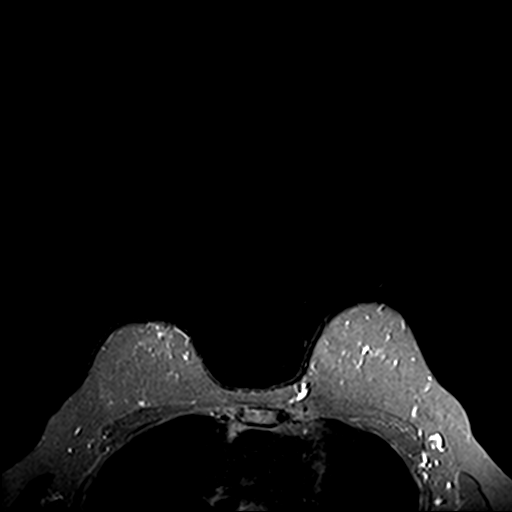
[im 58/58]
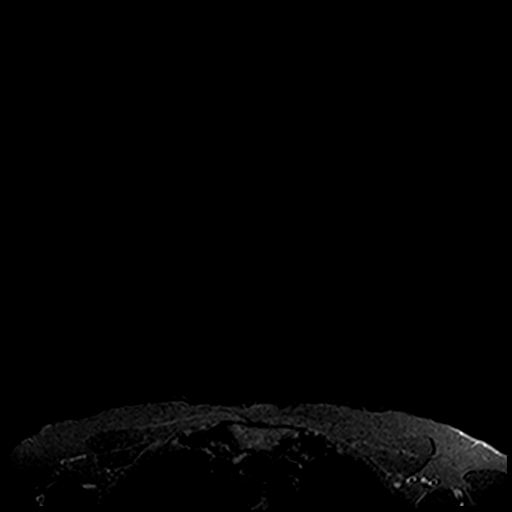

[Series 4: fl3d pre-cm · axial · non-contrast · 1.2mm · 0.94mm/px · z∈[-99,+72]mm · 8 of 144 slices shown]
[im 1/144]
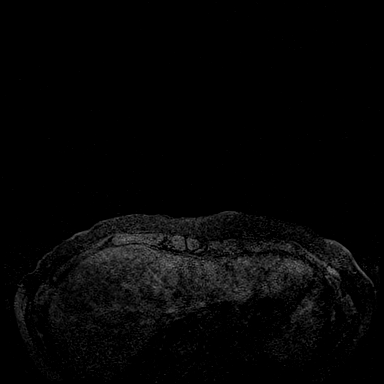
[im 23/144]
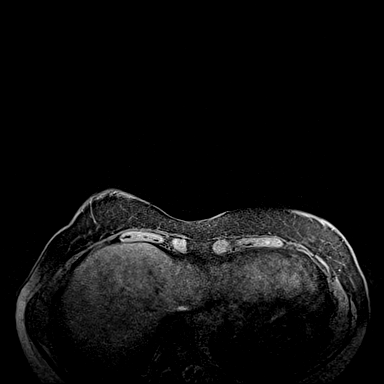
[im 45/144]
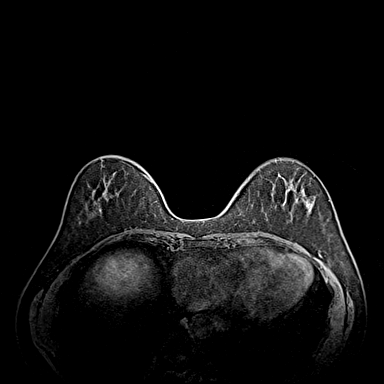
[im 67/144]
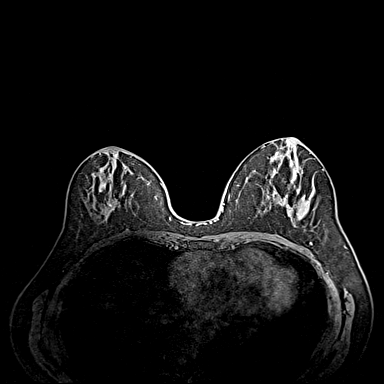
[im 78/144]
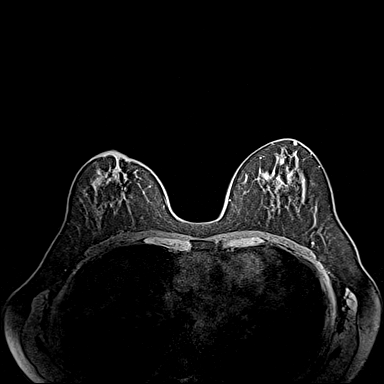
[im 100/144]
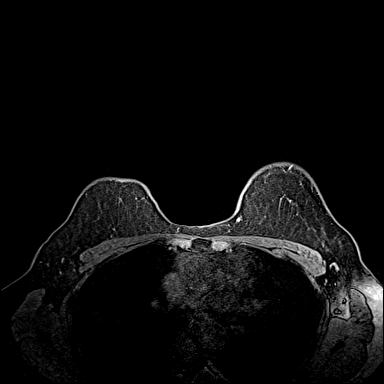
[im 122/144]
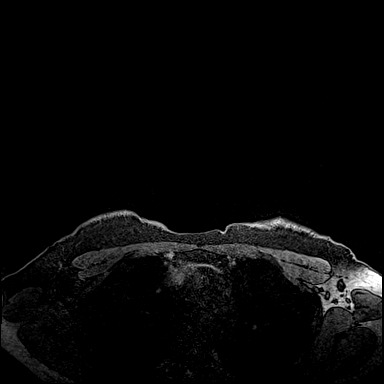
[im 144/144]
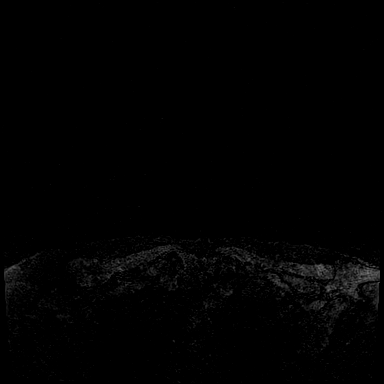

[Series 5: fl3d post immediate · axial · 1.2mm · 0.94mm/px · z∈[-99,+72]mm · 8 of 144 slices shown (1 of 3)]
[im 1/144]
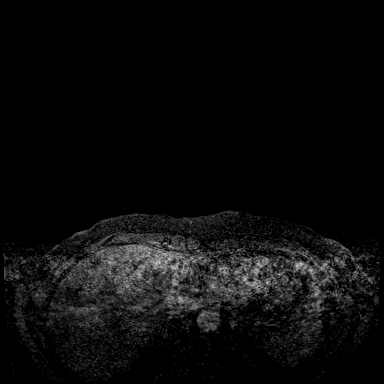
[im 23/144]
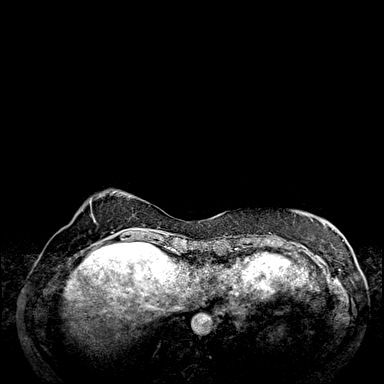
[im 45/144]
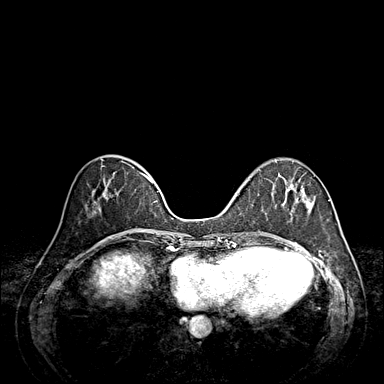
[im 67/144]
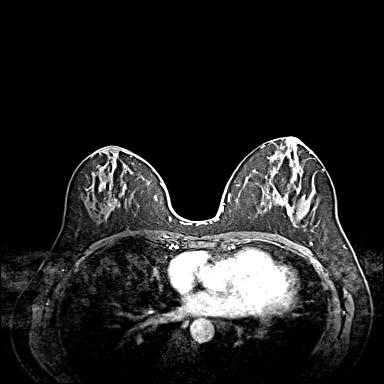
[im 78/144]
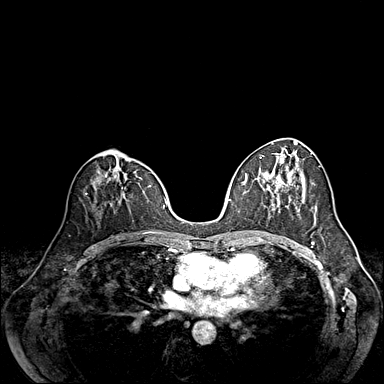
[im 100/144]
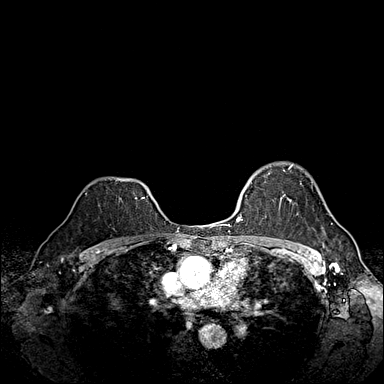
[im 122/144]
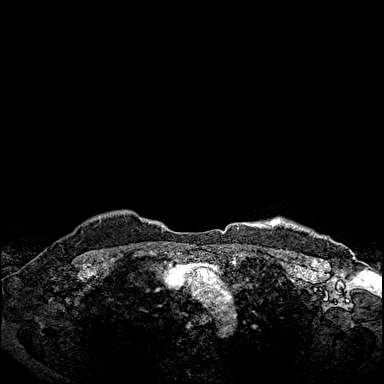
[im 144/144]
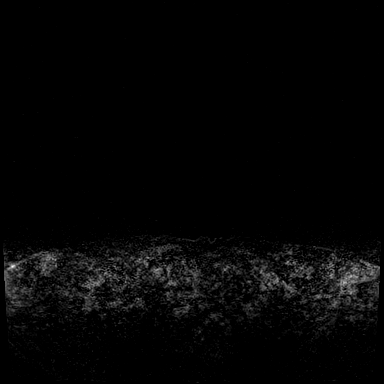

[Series 6: fl3d post immediate · axial · 1.2mm · 0.94mm/px · z∈[-99,+72]mm · 8 of 144 slices shown (2 of 3)]
[im 1/144]
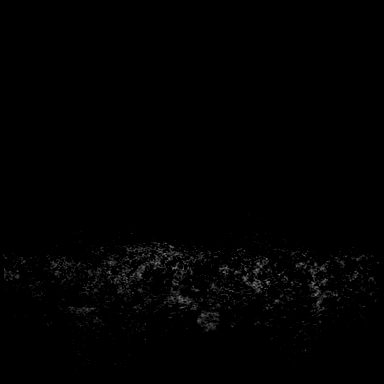
[im 23/144]
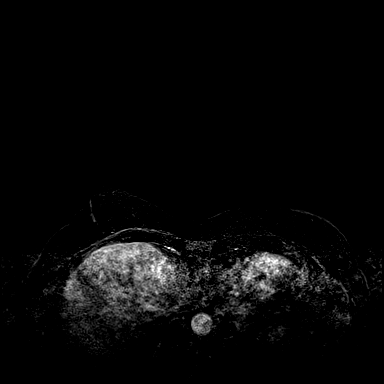
[im 45/144]
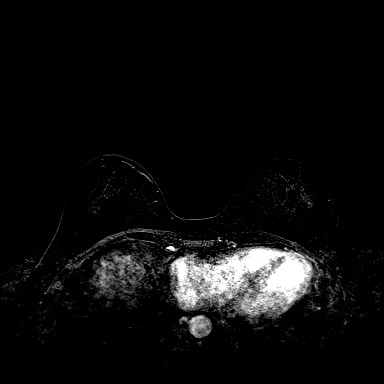
[im 67/144]
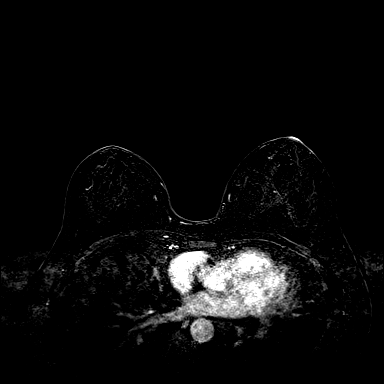
[im 78/144]
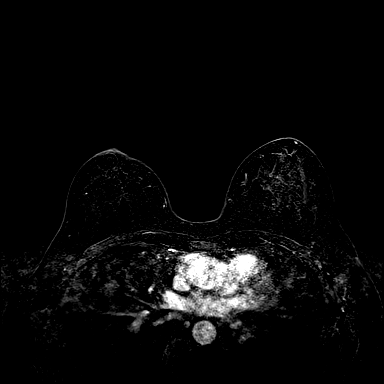
[im 100/144]
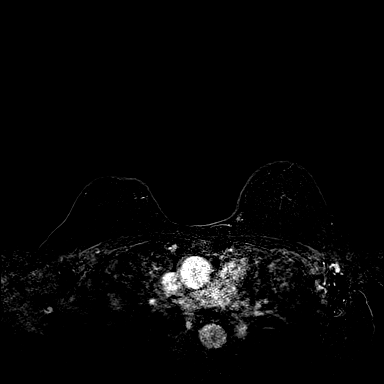
[im 122/144]
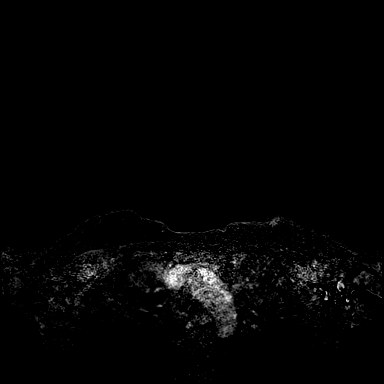
[im 144/144]
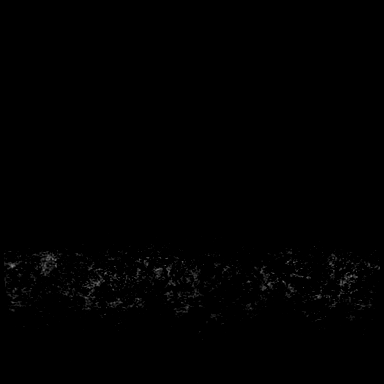

[Series 7: fl3d post immediate · axial · 172.8mm · 0.94mm/px · 1 of 1 slices shown (3 of 3)]
[im 1/1]
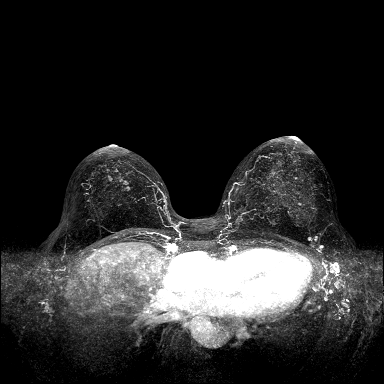

[30 of 48 positions shown; findings below may reference images not displayed]

Three-dimensional MR images were rendered by post-processing of the
original MR data on an independent workstation. The
three-dimensional MR images were interpreted, and findings are
reported in the following complete MRI report for this study. Three
dimensional images were evaluated at the independent [employer]
workstation
FINDINGS: Breast composition: c. Heterogeneous fibroglandular tissue.

Background parenchymal enhancement: Minimal

Right breast: Postoperative changes are identified in the MEDIAL
aspect of the RIGHT breast. No mass or abnormal enhancement.

Left breast: No mass or abnormal enhancement.

Lymph nodes: No abnormal appearing lymph nodes.

Ancillary findings:  None.
IMPRESSION: No MRI evidence for malignancy in either breast.

RECOMMENDATION:
Recommend diagnostic mammogram in [DATE].

The patient is eligible for annual abbreviated breast MRI.

BI-RADS CATEGORY  2: Benign.

## 2020-06-09 MED ORDER — GADOBUTROL 1 MMOL/ML IV SOLN
7.0000 mL | Freq: Once | INTRAVENOUS | Status: AC | PRN
  Administered 2020-06-09: 7 mL via INTRAVENOUS

## 2020-06-10 ENCOUNTER — Telehealth: Payer: Self-pay

## 2020-06-10 NOTE — Telephone Encounter (Signed)
RN notified patient of MRI results, no further needs at this time.

## 2020-10-05 ENCOUNTER — Telehealth: Payer: Self-pay | Admitting: *Deleted

## 2020-10-05 NOTE — Telephone Encounter (Signed)
Received call from pt stating she has been experiencing vaginal spotting and was examined by OBGYN Dr. Williams Che.  States a biopsy was preformed and pt diagnosed with a begin uterine growth. States Dr. Williams Che believes it may be related to Tamoxifen.  Pt states recent lab work shows she is now in menopause and requesting medication change.  Apt scheduled for pt to f/u with Wilber Bihari, NP.  RN placed call to Dr. Irena Cords office to request recent office notes and lab work for NP to review during apt.

## 2020-10-15 ENCOUNTER — Other Ambulatory Visit: Payer: Self-pay

## 2020-10-15 ENCOUNTER — Inpatient Hospital Stay: Payer: BC Managed Care – PPO | Attending: Adult Health | Admitting: Adult Health

## 2020-10-15 DIAGNOSIS — Z170 Estrogen receptor positive status [ER+]: Secondary | ICD-10-CM

## 2020-10-15 DIAGNOSIS — C50211 Malignant neoplasm of upper-inner quadrant of right female breast: Secondary | ICD-10-CM

## 2020-10-15 NOTE — Assessment & Plan Note (Signed)
12/12/2018:Screening mammogram detected a distortion in the right breast, measuring 67m on diagnostic mammogram and [place] Biopsy confirmed grade 1 IDC with DCIS, HER-2 negative by FISH, ER 90%, PR 90%, Ki67 10%.  01/17/19:Rt LumpectomyIDC with DCIS, 1.2cm, grade 1, clear margins, 2 axillary lymph nodes negative.HER-2 negative by FISH, ER 90%, PR 90%, Ki67 10%.  Oncotype DX score 24: 10% ROR Adjuvant radiation therapy 02/07/2019-03/15/2011  Current treatment: Antiestrogen therapy with anastrozole started October 2020.  This was switched to tamoxifen February 2021.  Initially F[place]rendon Hill[place] 66.5 but the estradiol came back at 370    Patient would like to stop tamoxifen due to endometrial thickening.  [facility]  I will order FSH, LH, and sensitive [facility] [place].  We discussed side effects of anastrozole in detail and she would like ot take Anastrozole once she is confirmed to be in menopause.    She is in agreement with this plan.

## 2020-10-15 NOTE — Progress Notes (Signed)
RN successfully faxed orders for [facility], LH, and Estradiol, Sensitive to [facility] in [place].

## 2020-10-15 NOTE — Progress Notes (Signed)
[facility] [facility] Follow up:    Debbie Berry., MD [address] [place] [address]   DIAGNOSIS: Cancer Staging Malignant neoplasm of upper-inner quadrant of right breast in female, estrogen receptor positive ([place]) Staging form: Breast, AJCC 8th Edition - Clinical stage from 12/19/2018: Stage IA (cT1b, cN0, cM0, G1, ER+, PR+, HER2-) - Signed by Nicholas Lose, MD on 12/19/2018 Stage prefix: Initial diagnosis Histologic grading system: 3 grade system - Pathologic stage from 01/17/2019: Stage IA (pT1c, pN0, cM0, G1, ER+, PR+, HER2-, Oncotype DX score: 24) - Signed by Gardenia Phlegm, NP on 05/27/2019 Stage prefix: Initial diagnosis Multigene prognostic tests performed: Oncotype DX Recurrence score range: Greater than or equal to 11 Histologic grading system: 3 grade system  I connected with Debbie Berry on 10/15/20 at  3:15 PM EDT by telephone and verified that I am speaking with the correct person using two identifiers.  I discussed the limitations, risks, security and privacy concerns of performing an evaluation and management service by telephone and the availability of in person appointments.  I also discussed with the patient that there may be a patient responsible charge related to this service. The patient expressed understanding and agreed to proceed.  Patient location: at home Provider location: [place] HISTORY: Oncology History  Malignant neoplasm of upper-inner quadrant of right breast in female, estrogen receptor positive ([place])  12/12/2018 Initial Diagnosis   Screening mammogram detected a distortion in the right breast, measuring 17m on diagnostic mammogram and [place] Biopsy confirmed grade 1 IDC with DCIS, HER-2 negative by FISH, ER 90%, PR 90%, Ki67 10%.    12/19/2018 Cancer Staging   Staging form: Breast, AJCC 8th Edition - Clinical stage from 12/19/2018: Stage IA (cT1b, cN0, cM0, G1, ER+, PR+, HER2-) - Signed by GNicholas Lose MD  on 12/19/2018   01/02/2019 Genetic Testing   Negative genetic testing on the [facility] Breast Cancer STAT panel. The STAT Breast cancer panel offered by [facility] includes sequencing and rearrangement analysis for the following 9 genes:  ATM, BRCA1, BRCA2, CDH1, CHEK2, PALB2, PTEN, STK11 and TP53. The report date is 01/02/2019.   01/17/2019 Surgery   Right lumpectomy (Marlou Starks: IDC with DCIS, 1.2cm, grade 1, clear margins, 2 axillary lymph nodes negative.   01/17/2019 Cancer Staging   Staging form: Breast, AJCC 8th Edition - Pathologic stage from 01/17/2019: Stage IA (pT1c, pN0, cM0, G1, ER+, PR+, HER2-, Oncotype DX score: 24) - Signed by CGardenia Phlegm NP on 05/27/2019   01/31/2019 Oncotype testing   Score of 24 with 10% chance of distant recurrence in 9 years without systemic treatment.    02/15/2019 - 03/15/2019 Radiation Therapy   Adjuvant radiation treatment   03/2019 -  Anti-estrogen oral therapy   Anastrozole     CURRENT THERAPY: Tamoxifen daily  INTERVAL HISTORY: Debbie Berry 50y.o. female returns for urgent evaluation.  She noted increased vaginal bleeding.  She underwent biopsy with Dr. WStann Mainlandand notes that her hormons were retested and she was determined to have gone through menopause.  Due to this he recommended undergo retesting of her hormone levels.  [facility]  There was no estradiol.     Patient Active Problem List   Diagnosis Date Noted  . Genetic testing 01/03/2019  . Family history of cervical cancer   . Malignant neoplasm of upper-inner quadrant of right breast in female, estrogen receptor positive ([place] 12/12/2018  . Allergic rhinitis  03/26/2015  . Cephalgia 03/26/2015  . Laryngopharyngeal reflux (LPR) 03/26/2015    is allergic to penicillins and nsaids.  MEDICAL HISTORY: Past Medical History:  Diagnosis Date  . Breast cancer ([place]) 12/2018   breast cancer -right   . Family history of cervical cancer   . Migraine   . Personal history  of radiation therapy   . Vertigo     SURGICAL HISTORY: Past Surgical History:  Procedure Laterality Date  . BREAST BIOPSY Right 11/2018  . BREAST LUMPECTOMY Right 12/2018  . BREAST LUMPECTOMY WITH RADIOACTIVE SEED AND SENTINEL LYMPH NODE BIOPSY Right 01/17/2019   Procedure: RIGHT BREAST LUMPECTOMY WITH RADIOACTIVE SEED AND SENTINEL LYMPH NODE BIOPSY;  Surgeon: Jovita Kussmaul, MD;  Location: [facility];  Service: General;  Laterality: Right;  . CHOLECYSTECTOMY    . HERNIA REPAIR      SOCIAL HISTORY: Social History   Socioeconomic History  . Marital status: Married    Spouse name: Not on file  . Number of children: Not on file  . Years of education: Not on file  . Highest education level: Not on file  Occupational History  . Not on file  Tobacco Use  . Smoking status: Former Smoker    Types: Cigarettes  . Smokeless tobacco: Never Used  Vaping Use  . Vaping Use: Never used  Substance and Sexual Activity  . Alcohol use: Yes    Comment: rare  . Drug use: Never  . Sexual activity: Not on file  Other Topics Concern  . Not on file  Social History Narrative  . Not on file   Social Determinants of Health   Financial Resource Strain: Not on file  Food Insecurity: Not on file  Transportation Needs: Not on file  Physical Activity: Not on file  Stress: Not on file  Social Connections: Not on file  Intimate Partner Violence: Not on file    FAMILY HISTORY: Family History  Problem Relation Age of Onset  . Colon polyps Mother   . Cervical cancer Paternal Grandmother   . Cervical cancer Paternal Aunt 66  . Colon cancer Neg Hx   . Esophageal cancer Neg Hx   . Rectal cancer Neg Hx   . Stomach cancer Neg Hx     Review of Systems  Constitutional: Negative for appetite change, chills, fatigue, fever and unexpected weight change.  HENT:   Negative for hearing loss, lump/mass, nosebleeds, sore throat and trouble swallowing.   Eyes: Negative for eye problems and  icterus.  Respiratory: Negative for chest tightness, cough and shortness of breath.   Cardiovascular: Negative for chest pain, leg swelling and palpitations.  Gastrointestinal: Negative for abdominal distention, abdominal pain, constipation, diarrhea, nausea and vomiting.  Endocrine: Negative for hot flashes.  Genitourinary: Negative for difficulty urinating.   Musculoskeletal: Negative for arthralgias.  Skin: Negative for itching and rash.  Neurological: Negative for dizziness, extremity weakness, headaches and numbness.  Hematological: Negative for adenopathy. Does not bruise/bleed easily.  Psychiatric/Behavioral: Negative for depression. The patient is not nervous/anxious.       PHYSICAL EXAMINATION Patient sounds well, no apparent distress.  Breathing normal. Mood and behavior normal.  LABORATORY DATA:  CBC    Component Value Date/Time   WBC 6.4 12/19/2018 1219   RBC 5.08 12/19/2018 1219   HGB 14.6 12/19/2018 1219   HCT 44.8 12/19/2018 1219   PLT 256 12/19/2018 1219   MCV 88.2 12/19/2018 1219   MCH 28.7 12/19/2018 1219   MCHC 32.6 12/19/2018  1219   RDW 12.2 12/19/2018 1219   LYMPHSABS 1.5 12/19/2018 1219   MONOABS 0.4 12/19/2018 1219   EOSABS 0.0 12/19/2018 1219   BASOSABS 0.0 12/19/2018 1219    CMP     Component Value Date/Time   NA 139 12/19/2018 1219   K 3.6 12/19/2018 1219   CL 106 12/19/2018 1219   CO2 23 12/19/2018 1219   GLUCOSE 93 12/19/2018 1219   BUN 10 12/19/2018 1219   CREATININE 0.80 12/19/2018 1219   CALCIUM 8.9 12/19/2018 1219   PROT 7.3 12/19/2018 1219   ALBUMIN 4.2 12/19/2018 1219   AST 20 12/19/2018 1219   ALT 32 12/19/2018 1219   ALKPHOS 34 (L) 12/19/2018 1219   BILITOT 0.4 12/19/2018 1219   GFRNONAA >60 12/19/2018 1219   GFRAA >60 12/19/2018 1219         ASSESSMENT and THERAPY PLAN:   Malignant neoplasm of upper-inner quadrant of right breast in female, estrogen receptor positive ([place]) 12/12/2018:Screening mammogram detected a  distortion in the right breast, measuring 34m on diagnostic mammogram and [place] Biopsy confirmed grade 1 IDC with DCIS, HER-2 negative by FISH, ER 90%, PR 90%, Ki67 10%.  01/17/19:Rt LumpectomyIDC with DCIS, 1.2cm, grade 1, clear margins, 2 axillary lymph nodes negative.HER-2 negative by FISH, ER 90%, PR 90%, Ki67 10%.  Oncotype DX score 24: 10% ROR Adjuvant radiation therapy 02/07/2019-03/15/2011  Current treatment: Antiestrogen therapy with anastrozole started October 2020.  This was switched to tamoxifen February 2021.  Initially FTurrellwas 66.5 but the estradiol came back at 373    Patient would like to stop tamoxifen due to endometrial thickening.  She only [facility] W[facility]ann Main[facility]  I will order FSH, LH, and sensitive [place] [place].  We discussed side effects of anastrozole in detail and she would like ot take Anastrozole once she is confirmed to be in menopause.    She is in agreement with this plan.      The patient was provided an opportunity to ask questions and all were answered. The patient agreed with the plan and demonstrated an understanding of the instructions.   The patient was advised to call back or seek an in-person evaluation if the symptoms worsen or if the condition fails to improve as anticipated.   I provided 20 minutes of non face-to-face telephone visit time during this encounter, and > 50% was spent counseling as documented under my assessment & plan.  LWilber Bihari NP 10/15/20 3:36 PM Medical Oncology and [facility] [place] [id number]. [phone]   Fax. [phone]

## 2020-10-22 ENCOUNTER — Encounter: Payer: Self-pay | Admitting: Adult Health

## 2020-10-23 ENCOUNTER — Other Ambulatory Visit: Payer: Self-pay | Admitting: Adult Health

## 2020-10-23 DIAGNOSIS — E2839 Other primary ovarian failure: Secondary | ICD-10-CM

## 2020-10-23 DIAGNOSIS — Z170 Estrogen receptor positive status [ER+]: Secondary | ICD-10-CM

## 2020-10-23 MED ORDER — ANASTROZOLE 1 MG PO TABS: 1.0000 mg | ORAL_TABLET | Freq: Every day | ORAL | 3 refills | Status: DC

## 2020-10-23 NOTE — Progress Notes (Signed)
Debbie Berry and I reviewed her lab results of [name], LH, and estradiol.  She is postmenopausal.  I placed orders for Anastrozole, she can take this in 2 weeks.  I ordered bone density testing.  She will start anastrozole at the end of May, 2022.    Wilber Bihari, NP 10/23/20 4:53 PM Medical Oncology and Hematology [facility] [place], [place] [address] Tel. [phone]    Fax. [phone]

## 2020-11-02 ENCOUNTER — Other Ambulatory Visit: Payer: Self-pay | Admitting: Orthopedic Surgery

## 2020-11-02 DIAGNOSIS — M25512 Pain in left shoulder: Secondary | ICD-10-CM

## 2020-11-03 ENCOUNTER — Other Ambulatory Visit: Payer: Self-pay | Admitting: Adult Health

## 2020-11-03 ENCOUNTER — Ambulatory Visit
Admission: RE | Admit: 2020-11-03 | Discharge: 2020-11-03 | Disposition: A | Discharge status: Home / Self Care | Payer: BC Managed Care – PPO | Source: Ambulatory Visit | Attending: Orthopedic Surgery | Admitting: Orthopedic Surgery

## 2020-11-03 DIAGNOSIS — Z853 Personal history of malignant neoplasm of breast: Secondary | ICD-10-CM

## 2020-11-03 DIAGNOSIS — M25512 Pain in left shoulder: Secondary | ICD-10-CM

## 2020-11-03 IMAGING — MR MR SHOULDER*L* W/O CM
4 of 5 series · 21 of 40 positions shown · non-contrast
Comparison: X-ray [DATE]

CLINICAL DATA: Left shoulder pain and limited range of motion since
[DATE]

EXAM:
MRI OF THE LEFT SHOULDER WITHOUT CONTRAST
TECHNIQUE: Multiplanar, multisequence MR imaging of the shoulder was performed.
No intravenous contrast was administered.

[Series 6: T2 fat-sat · axial · left · 3.0mm · 0.47mm/px · z∈[-82,+26]mm · 8 of 29 slices shown (1 of 3)]
[im 1/29]
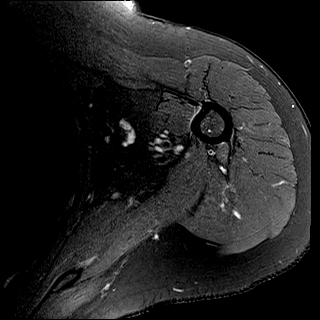
[im 4/29]
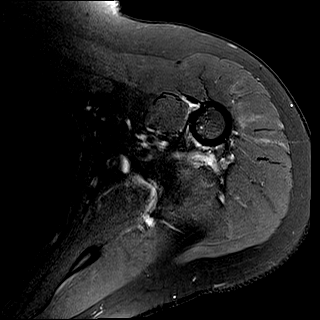
[im 10/29]
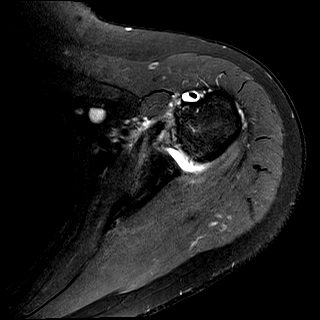
[im 13/29]
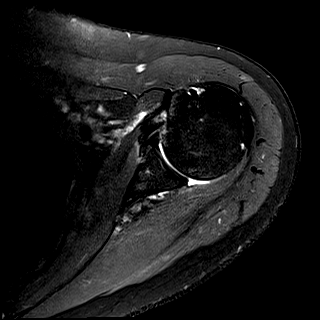
[im 16/29]
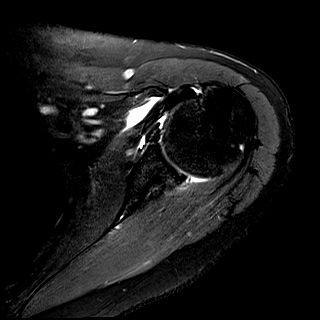
[im 19/29]
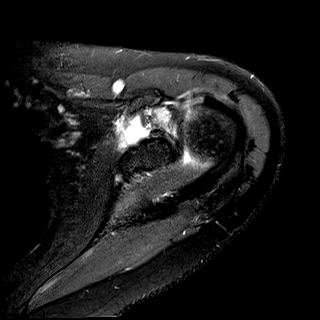
[im 25/29]
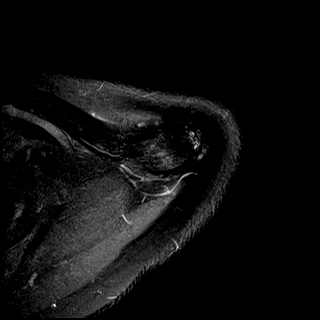
[im 29/29]
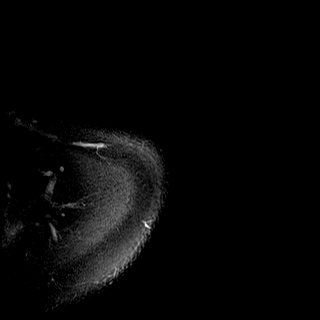

[Series 9: T2 fat-sat · oblique · left · 4.0mm · 0.44mm/px · 3 of 23 slices shown (2 of 3)]
[im 4/23]
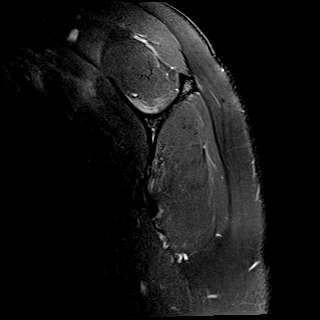
[im 13/23]
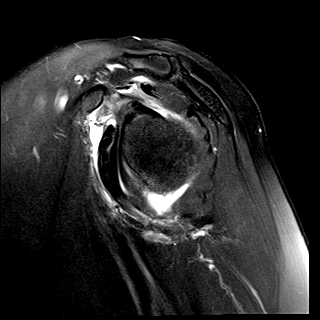
[im 19/23]
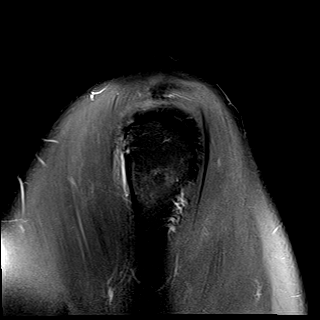

[Series 100: T2 fat-sat · oblique · left · 4.0mm · 0.22mm/px · 3 of 21 slices shown (3 of 3)]
[im 4/21]
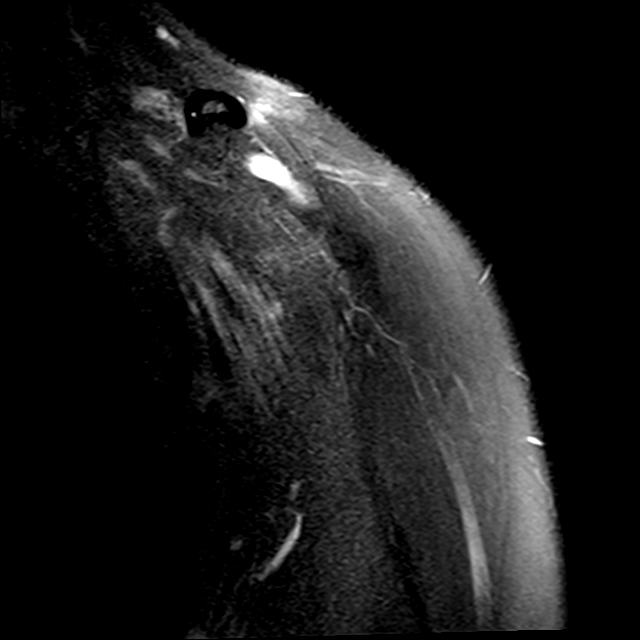
[im 11/21]
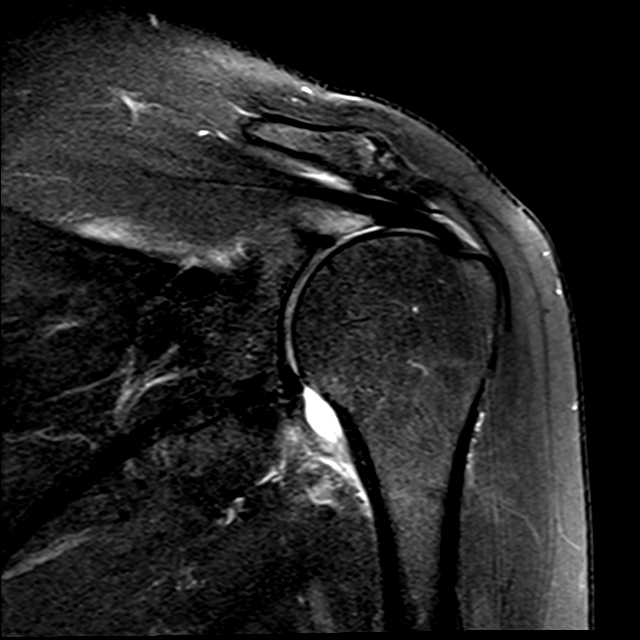
[im 17/21]
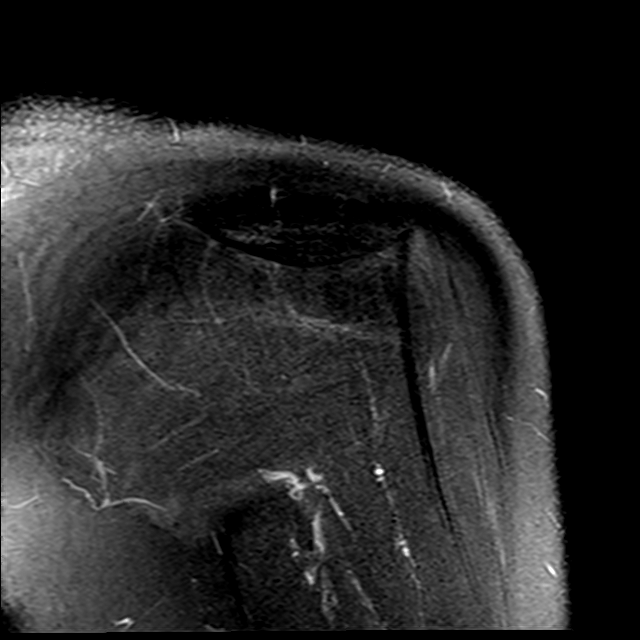

[Series 101: PD · oblique · left · 4.0mm · 0.22mm/px · 7 of 21 slices shown]
[im 1/21]
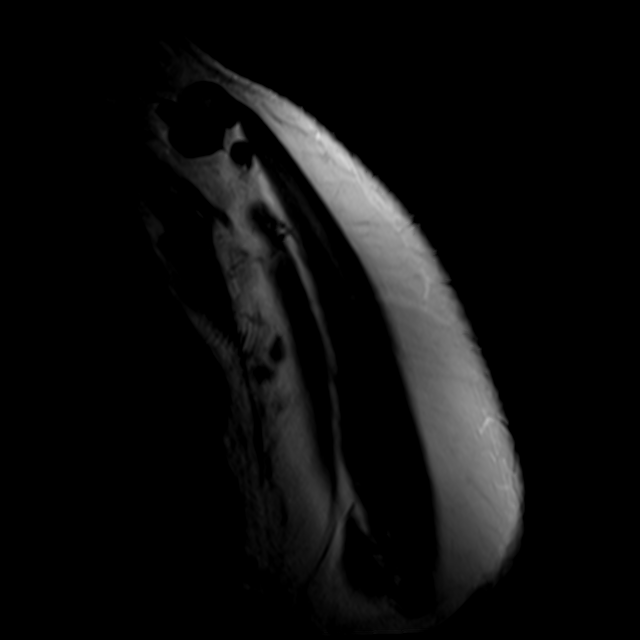
[im 4/21]
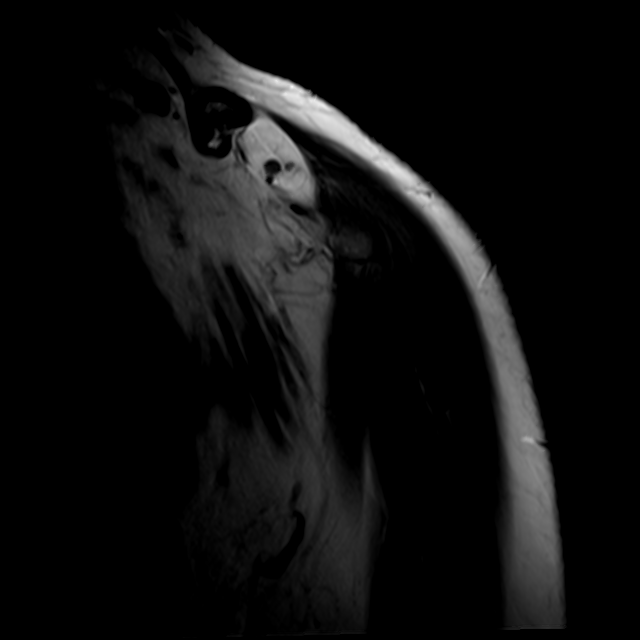
[im 7/21]
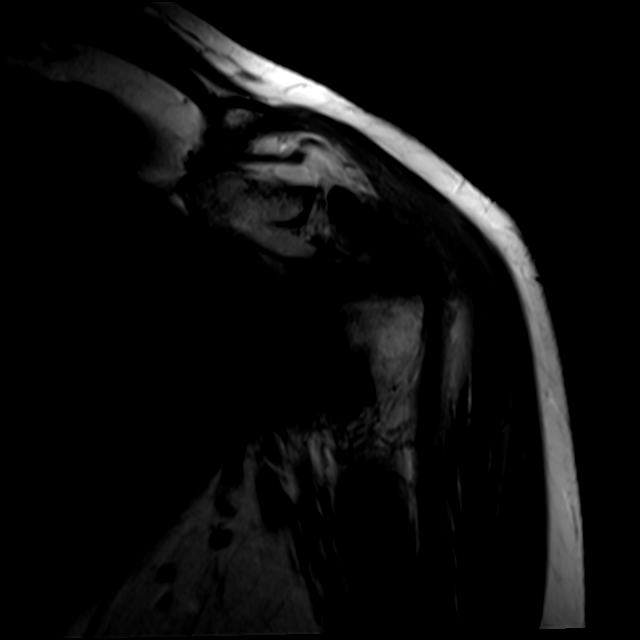
[im 11/21]
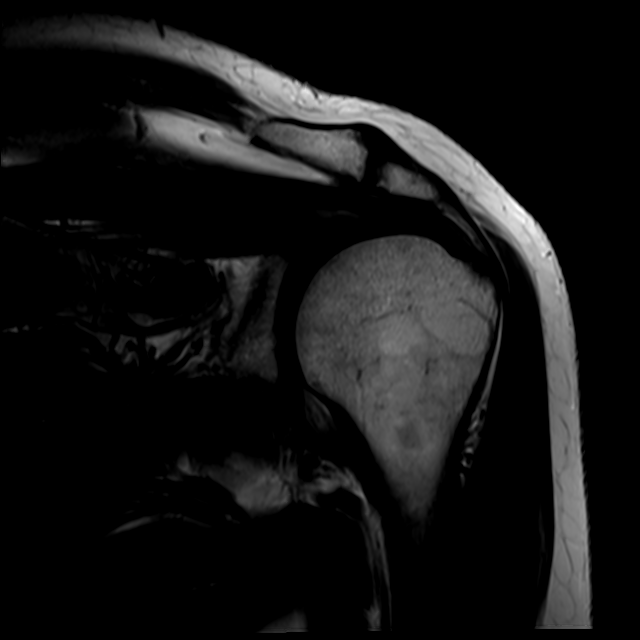
[im 14/21]
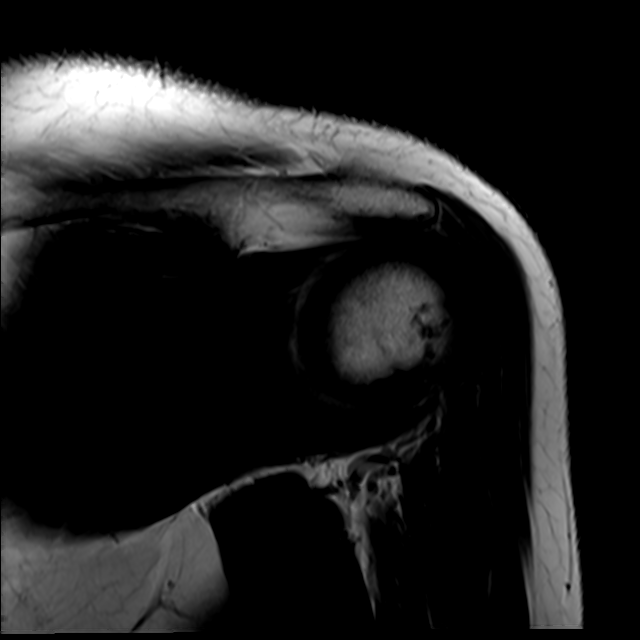
[im 17/21]
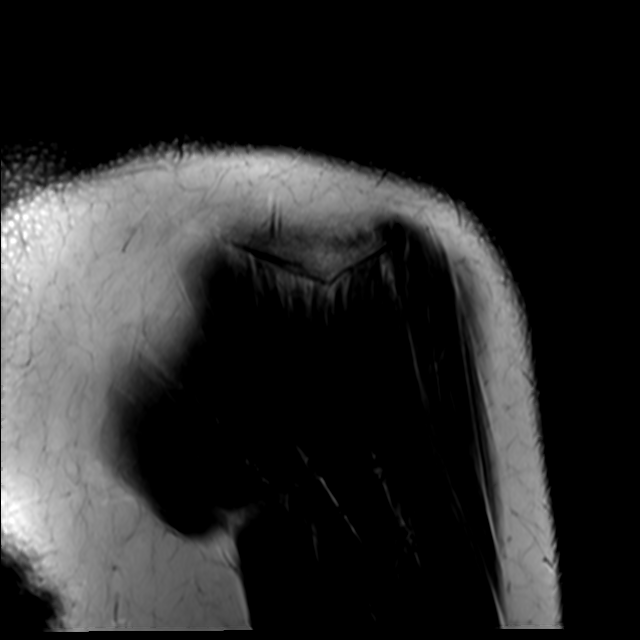
[im 21/21]
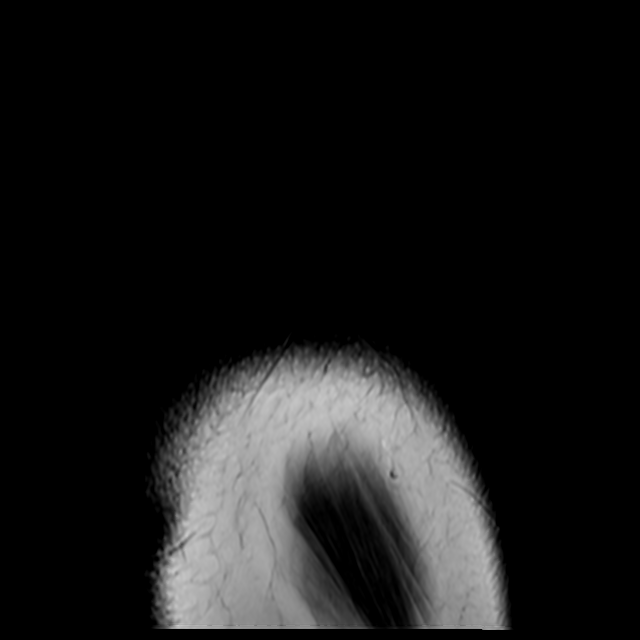

[21 of 40 positions shown; findings below may reference images not displayed]

FINDINGS: Rotator cuff: Mild supraspinatus tendinosis with mild bursal sided
fraying of the distal tendon (series 100, images 10-11). There is
also low-grade fraying of the distal subscapularis tendon.
Infraspinatus and teres minor tendons intact. No well-defined
rotator cuff tear.

Muscles: Preserved bulk and signal intensity of the rotator cuff
musculature without edema, atrophy, or fatty infiltration.

Biceps long head: Longitudinal split tear of the intra-articular
portion of the biceps tendon extending into the tendon anchor site
(series 9, images 14-15). Small volume tenosynovial fluid.

Acromioclavicular Joint: Mild arthropathy of the AC joint. Trace
subacromial-subdeltoid bursal fluid.

Glenohumeral Joint: Mild chondral thinning of the superior humeral
head. Small glenohumeral joint effusion.

Labrum: Evaluation is slightly limited on non-arthrographic imaging.
The anterior and anteroinferior labrum appears degenerated and torn
(series 6, images 16-20). No paralabral cyst.

Bones:  No marrow abnormality, fracture or dislocation.

Other: None.
IMPRESSION: 1. Mild tendinosis and low-grade fraying of the supraspinatus and
subscapularis tendons. No well-defined rotator cuff tear.
2. Longitudinal split tear of the intra-articular portion of the
biceps tendon.
3. Anterior and anteroinferior labral tear.
4. Mild glenohumeral and AC joint osteoarthritis.

## 2020-11-09 ENCOUNTER — Other Ambulatory Visit: Payer: BC Managed Care – PPO

## 2020-11-24 ENCOUNTER — Ambulatory Visit
Admission: RE | Admit: 2020-11-24 | Discharge: 2020-11-24 | Disposition: A | Discharge status: Home / Self Care | Payer: BC Managed Care – PPO | Source: Ambulatory Visit | Attending: Adult Health | Admitting: Adult Health

## 2020-11-24 ENCOUNTER — Other Ambulatory Visit: Payer: Self-pay

## 2020-11-24 DIAGNOSIS — E2839 Other primary ovarian failure: Secondary | ICD-10-CM

## 2020-11-26 ENCOUNTER — Telehealth: Payer: Self-pay | Admitting: *Deleted

## 2020-11-26 NOTE — Telephone Encounter (Signed)
-----   Message from Gardenia Phlegm, NP sent at 11/25/2020  3:10 PM EDT ----- Bone density is normal, please notify patient  ----- Message ----- From: Interface, Rad Results In Sent: 11/25/2020   1:15 PM EDT To: Gardenia Phlegm, NP

## 2020-11-26 NOTE — Telephone Encounter (Signed)
Pt called and notified of bone density results.

## 2020-12-10 ENCOUNTER — Other Ambulatory Visit: Payer: Self-pay

## 2020-12-10 ENCOUNTER — Ambulatory Visit
Admission: RE | Admit: 2020-12-10 | Discharge: 2020-12-10 | Disposition: A | Discharge status: Home / Self Care | Payer: BC Managed Care – PPO | Source: Ambulatory Visit | Attending: Adult Health | Admitting: Adult Health

## 2020-12-10 DIAGNOSIS — Z853 Personal history of malignant neoplasm of breast: Secondary | ICD-10-CM

## 2020-12-10 IMAGING — MG DIGITAL DIAGNOSTIC BILAT W/ TOMO W/ CAD
8 of 13 series · 9 of 37 positions shown · non-contrast
Comparison: Previous exam(s).

CLINICAL DATA: Right lumpectomy.  Annual mammography.

EXAM:
DIGITAL DIAGNOSTIC BILATERAL MAMMOGRAM WITH TOMOSYNTHESIS AND CAD
TECHNIQUE: Bilateral digital diagnostic mammography and breast tomosynthesis
was performed. The images were evaluated with computer-aided
detection.

[R CC]
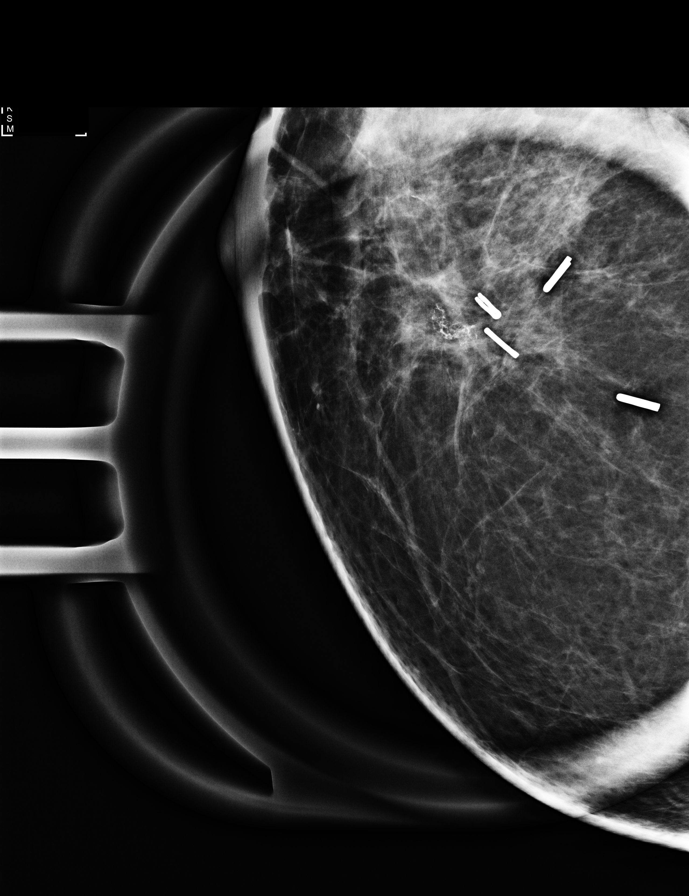

[R CC synth-2D (1 of 2)]
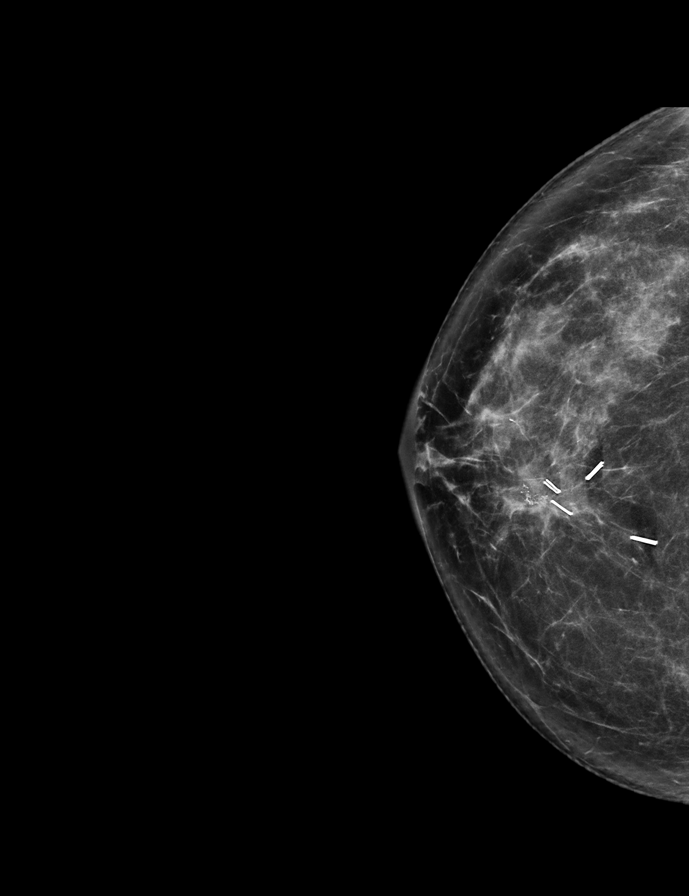

[L MLO synth-2D]
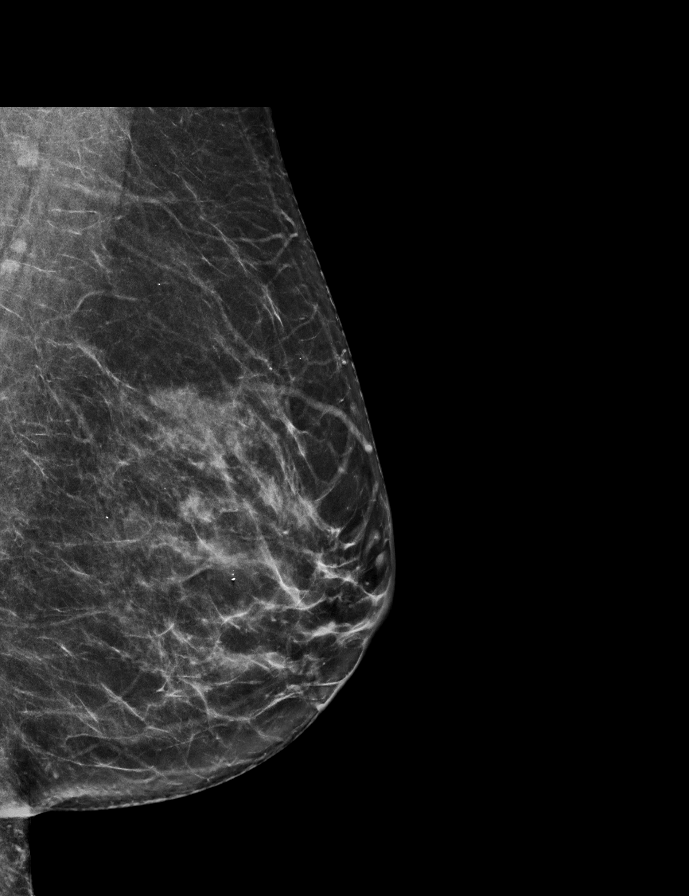

[L CC synth-2D]
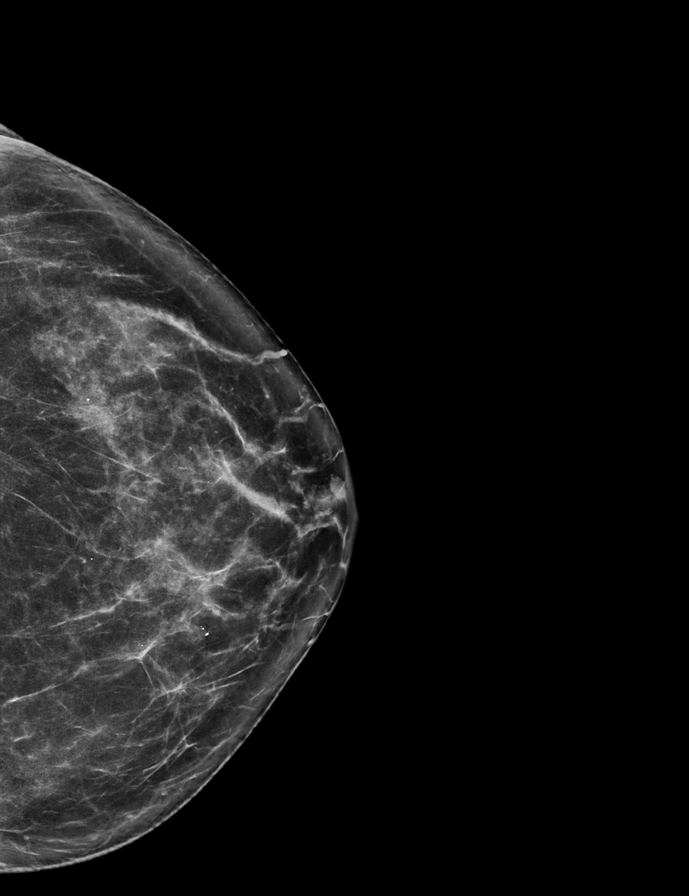

[R MLO synth-2D]
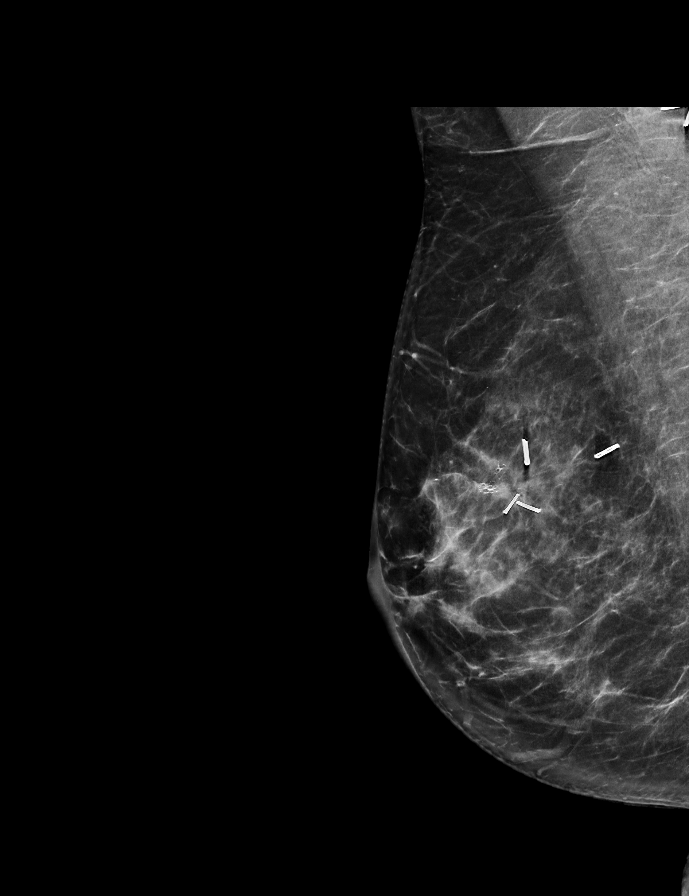

[R CC synth-2D (2 of 2)]
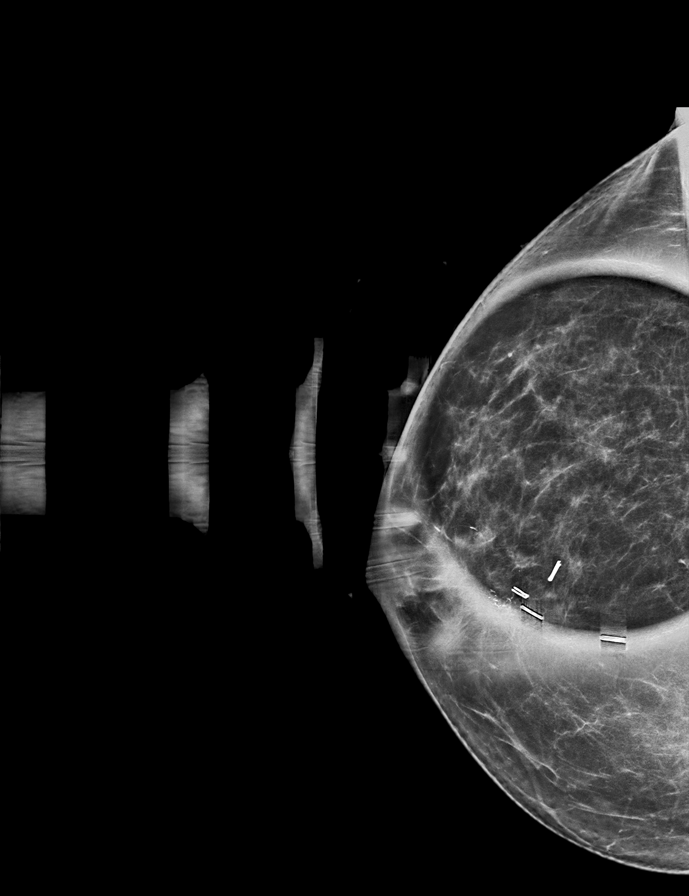

[R XCCL synth-2D]
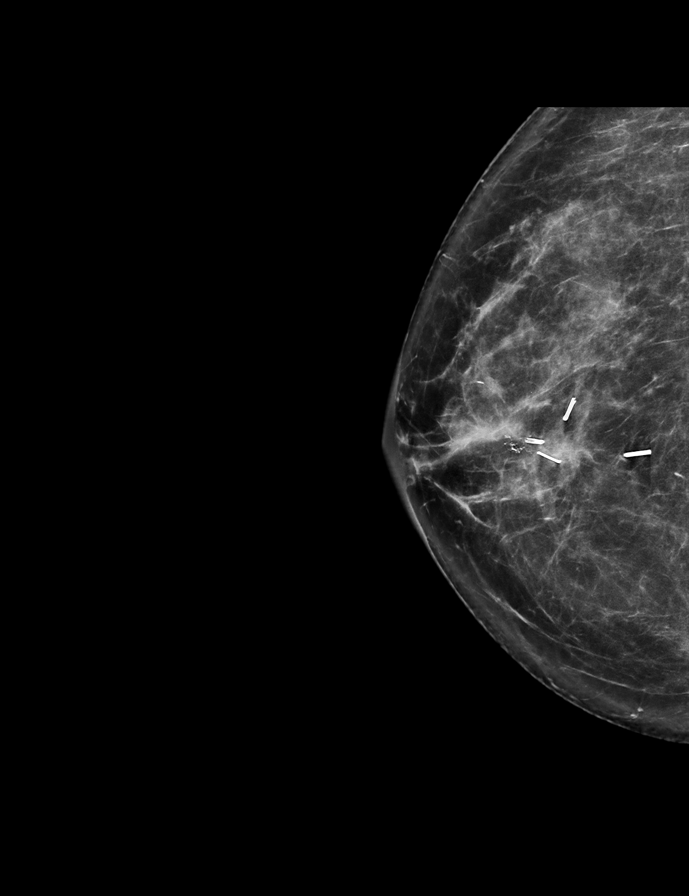

[R XCCL tomo · 2 of 73 frames shown]
[frame 24/73]
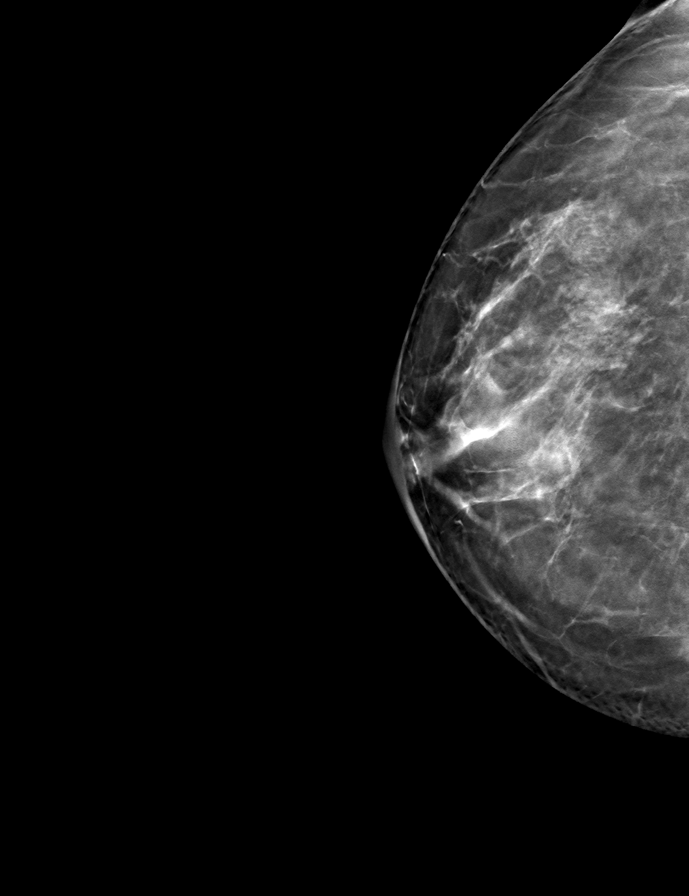
[frame 37/73]
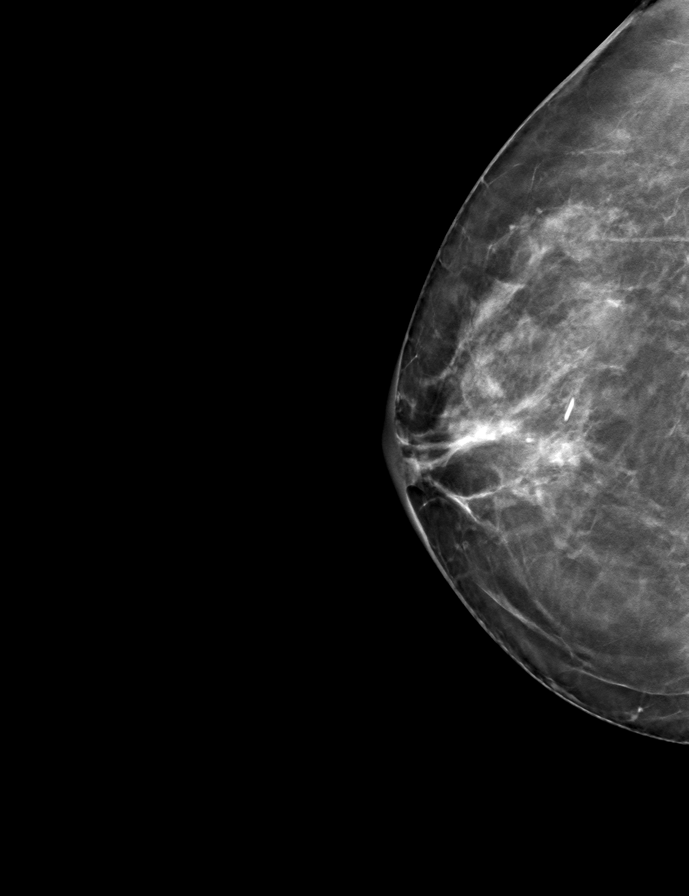

[9 of 37 positions shown; findings below may reference images not displayed]

ACR Breast Density Category c: The breast tissue is heterogeneously
dense, which may obscure small masses.
FINDINGS: An asymmetry in the lateral right breast resolves on additional
imaging. Of all vein fat necrosis is seen at the lumpectomy site. No
suspicious findings are seen in either breast.
IMPRESSION: No mammographic evidence of malignancy.

RECOMMENDATION:
Per protocol, as the patient is now 2 or more years status post
lumpectomy, she may return to annual screening mammography in 1
year. However, given the history of breast cancer, the patient
remains eligible for annual diagnostic mammography if preferred.

I have discussed the findings and recommendations with the patient.
If applicable, a reminder letter will be sent to the patient
regarding the next appointment.

BI-RADS CATEGORY  2: Benign.

## 2021-01-01 ENCOUNTER — Other Ambulatory Visit: Payer: Self-pay | Admitting: Obstetrics & Gynecology

## 2021-01-01 DIAGNOSIS — R1031 Right lower quadrant pain: Secondary | ICD-10-CM

## 2021-01-06 ENCOUNTER — Other Ambulatory Visit: Payer: Self-pay | Admitting: Obstetrics & Gynecology

## 2021-01-06 DIAGNOSIS — R1031 Right lower quadrant pain: Secondary | ICD-10-CM

## 2021-01-06 DIAGNOSIS — Z853 Personal history of malignant neoplasm of breast: Secondary | ICD-10-CM

## 2021-01-21 ENCOUNTER — Other Ambulatory Visit: Payer: Self-pay

## 2021-01-21 ENCOUNTER — Ambulatory Visit
Admission: RE | Admit: 2021-01-21 | Discharge: 2021-01-21 | Disposition: A | Discharge status: Home / Self Care | Payer: BC Managed Care – PPO | Source: Ambulatory Visit | Attending: Obstetrics & Gynecology | Admitting: Obstetrics & Gynecology

## 2021-01-21 DIAGNOSIS — Z853 Personal history of malignant neoplasm of breast: Secondary | ICD-10-CM

## 2021-01-21 DIAGNOSIS — R1031 Right lower quadrant pain: Secondary | ICD-10-CM

## 2021-01-21 IMAGING — CT CT ABD-PELV W/ CM
1 of 3 series · 14 of 32 positions shown, 19 images · IV contrast (APPLIED)
Comparison: [DATE] from [HOSPITAL] [REDACTED]

CLINICAL DATA: Right lower quadrant pain for 1 month. Prior
cholecystectomy.

EXAM:
CT ABDOMEN AND PELVIS WITH CONTRAST
TECHNIQUE: Multidetector CT imaging of the abdomen and pelvis was performed
using the standard protocol following bolus administration of
intravenous contrast.
CONTRAST:  100mL [JG] IOPAMIDOL ([JG]) INJECTION 61%

[Series 2: abd/pelvis w/cm · axial · 0.74mm/px · z∈[-434,-50]mm · 14 of 89 slices shown, 19 images]
[im 6/89  soft-tissue]
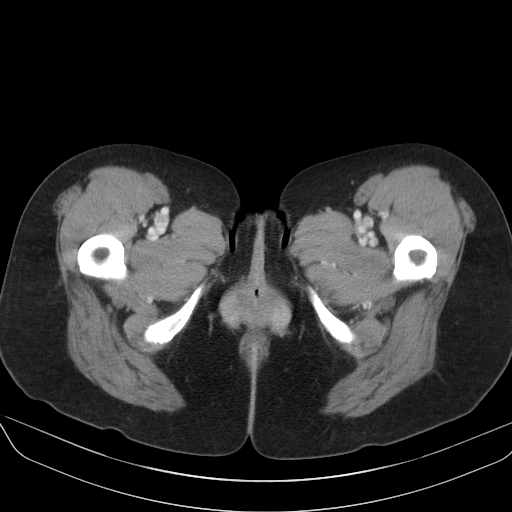
[im 6/89  bone]
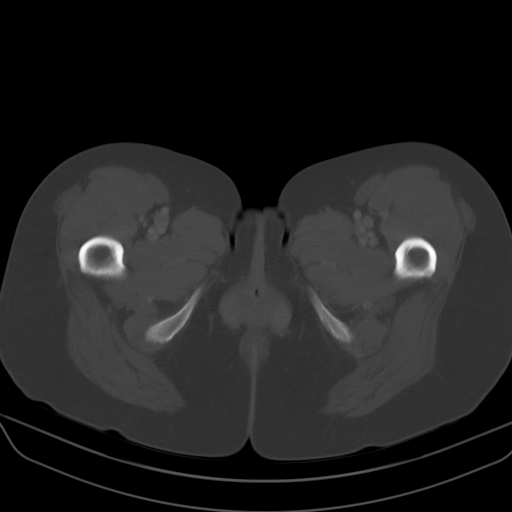
[im 11/89  soft-tissue]
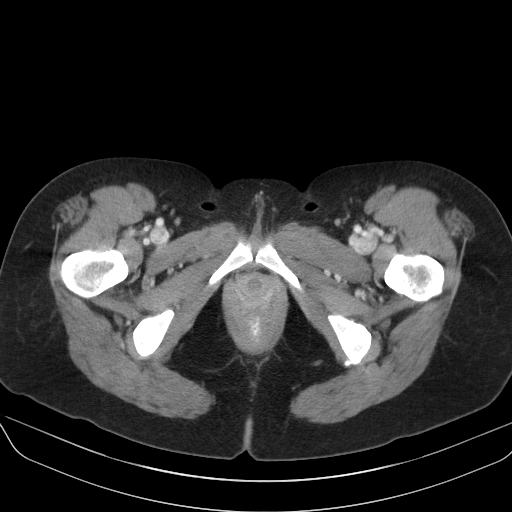
[im 21/89  soft-tissue]
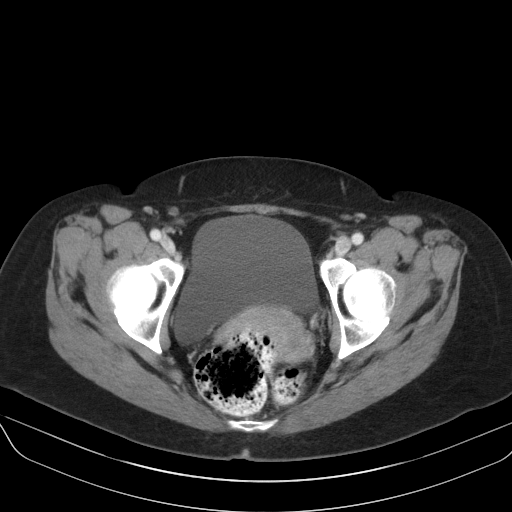
[im 26/89  soft-tissue]
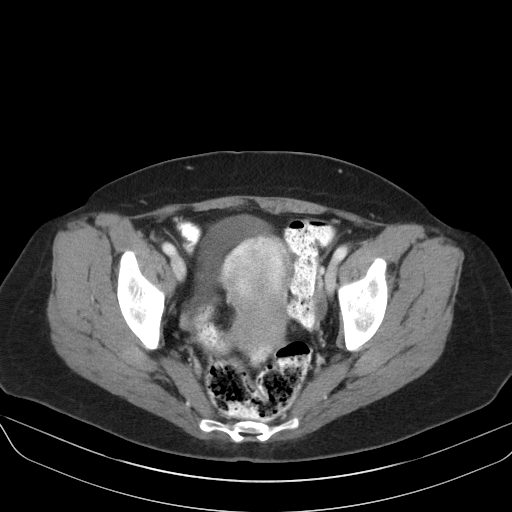
[im 32/89  soft-tissue]
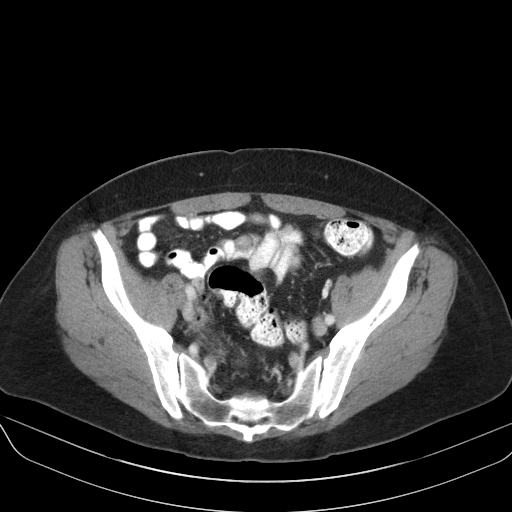
[im 37/89  soft-tissue]
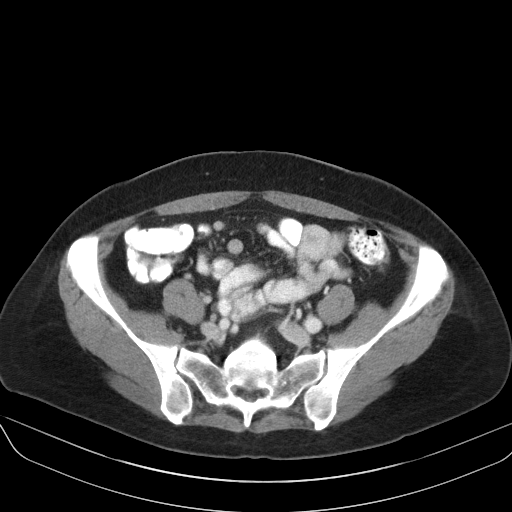
[im 47/89  soft-tissue]
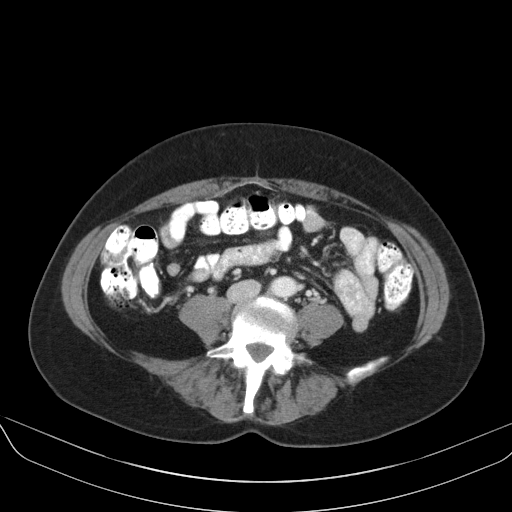
[im 52/89  soft-tissue]
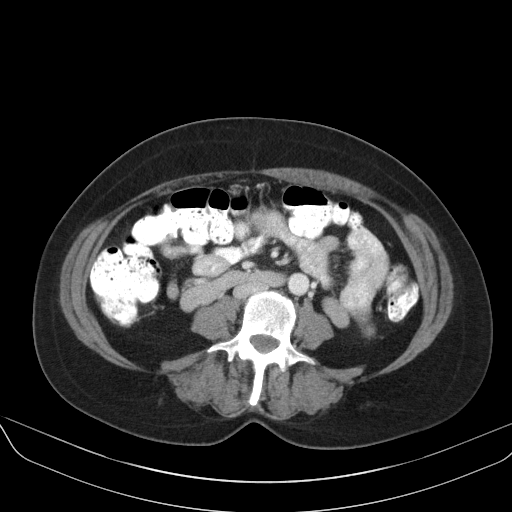
[im 57/89  soft-tissue]
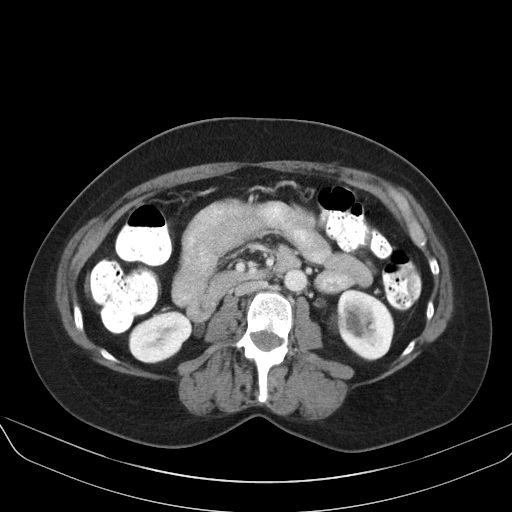
[im 57/89  bone]
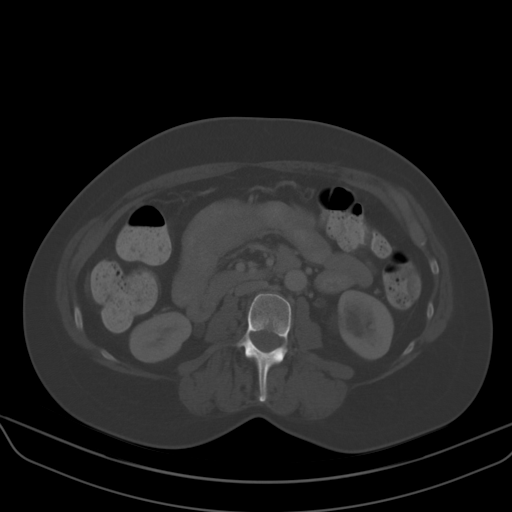
[im 63/89  soft-tissue]
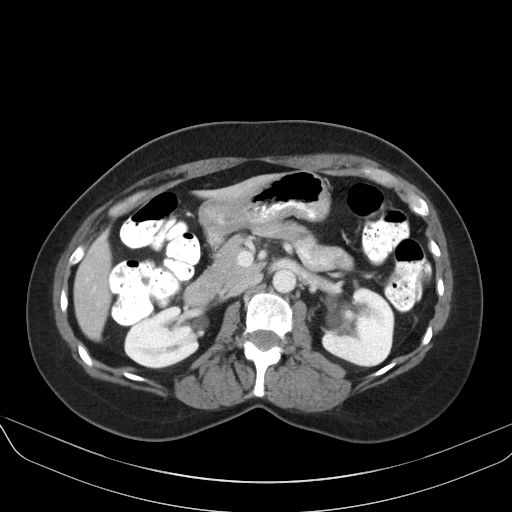
[im 68/89  soft-tissue]
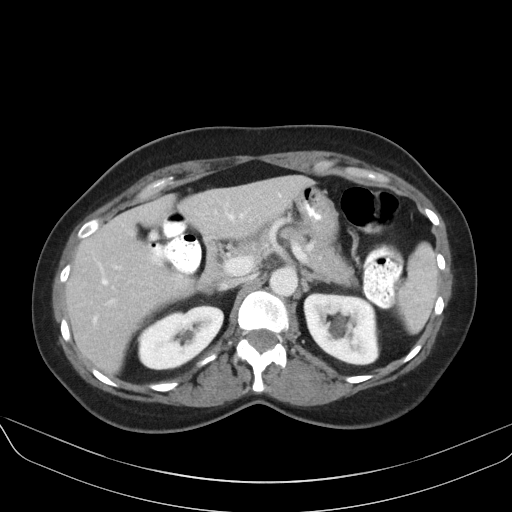
[im 68/89  lung]
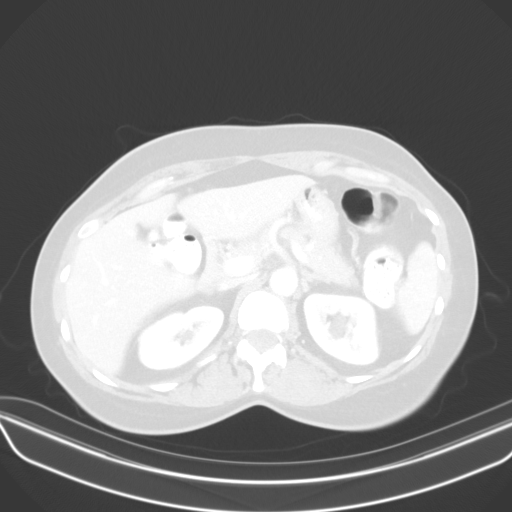
[im 73/89  lung]
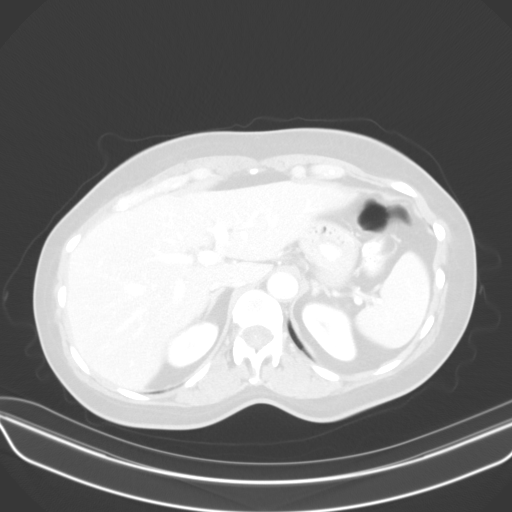
[im 78/89  soft-tissue]
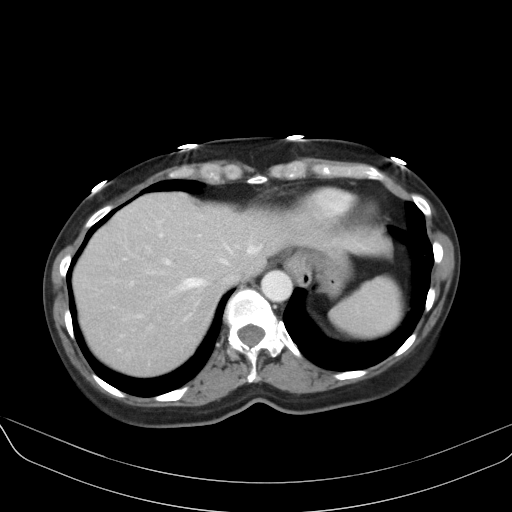
[im 78/89  lung]
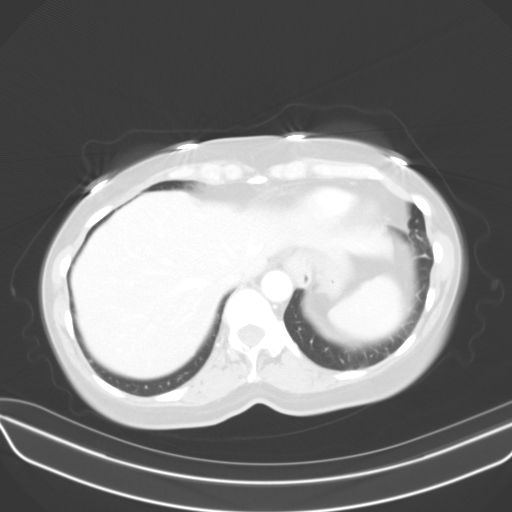
[im 83/89  soft-tissue]
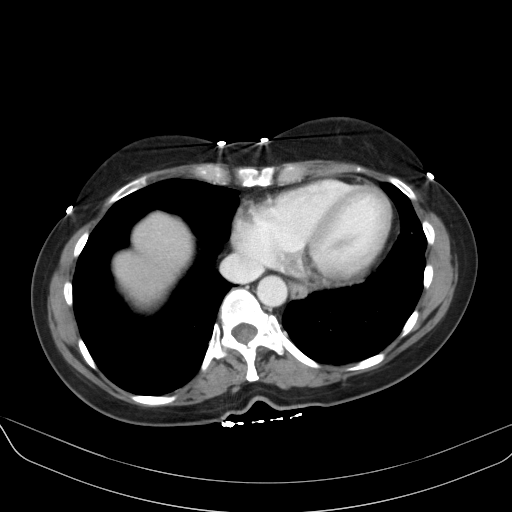
[im 83/89  lung]
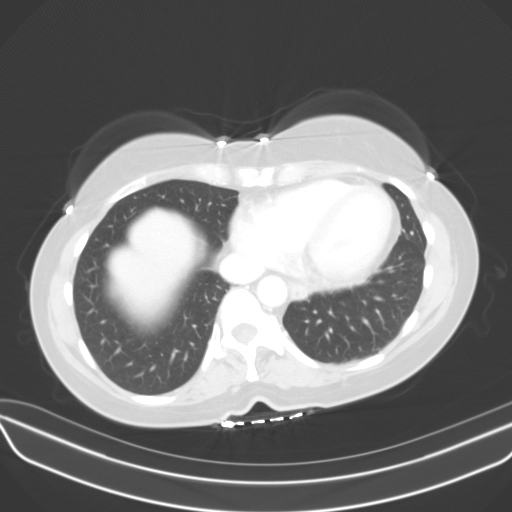

[14 of 32 positions shown; findings below may reference images not displayed]

FINDINGS: Lower Chest: No acute findings.

Hepatobiliary: No hepatic masses identified. Prior cholecystectomy.
No evidence of biliary obstruction.

Pancreas:  No mass or inflammatory changes.

Spleen: Within normal limits in size and appearance.

Adrenals/Urinary Tract: No masses identified. Several left renal
sinus cysts are again noted. No evidence of ureteral calculi or
hydronephrosis. Unremarkable unopacified urinary bladder.

Stomach/Bowel: No evidence of obstruction, inflammatory process or
abnormal fluid collections. Normal appendix visualized.

Vascular/Lymphatic: No pathologically enlarged lymph nodes. No acute
vascular findings.

Reproductive: A 1 cm subserosal fibroid is again seen arising from
the uterine fundus. No other pelvic masses are identified. No
evidence of inflammatory process or abnormal fluid collections.

Other: Surgical mesh is seen in the anterior abdominal wall soft
tissues, and there is no evidence of recurrent hernia.

Musculoskeletal:  No suspicious bone lesions identified.
IMPRESSION: No evidence of appendicitis or other acute findings.

Stable 1 cm uterine fibroid.

## 2021-01-21 MED ORDER — IOPAMIDOL (ISOVUE-300) INJECTION 61%
100.0000 mL | Freq: Once | INTRAVENOUS | Status: AC | PRN
  Administered 2021-01-21: 100 mL via INTRAVENOUS

## 2021-03-04 ENCOUNTER — Ambulatory Visit
Admission: RE | Admit: 2021-03-04 | Discharge: 2021-03-04 | Disposition: A | Discharge status: Home / Self Care | Payer: BC Managed Care – PPO | Source: Ambulatory Visit | Attending: Obstetrics & Gynecology | Admitting: Obstetrics & Gynecology

## 2021-03-04 DIAGNOSIS — R1031 Right lower quadrant pain: Secondary | ICD-10-CM

## 2021-03-04 IMAGING — US US PELVIS COMPLETE WITH TRANSVAGINAL
2 series · 13 of 25 positions shown · non-contrast
Comparison: CT [DATE]

CLINICAL DATA: Right lower pelvic pain postmenopausal spotting

EXAM:
TRANSABDOMINAL AND TRANSVAGINAL ULTRASOUND OF PELVIS
TECHNIQUE: Both transabdominal and transvaginal ultrasound examinations of the
pelvis were performed. Transabdominal technique was performed for
global imaging of the pelvis including uterus, ovaries, adnexal
regions, and pelvic cul-de-sac. It was necessary to proceed with
endovaginal exam following the transabdominal exam to visualize the
uterus endometrium ovaries.

[Series 1: us pelvis complete with transvaginal · 0.36mm/px · 49 acquisitions, 12 frames shown (1 of 2)]
[im 1/49]
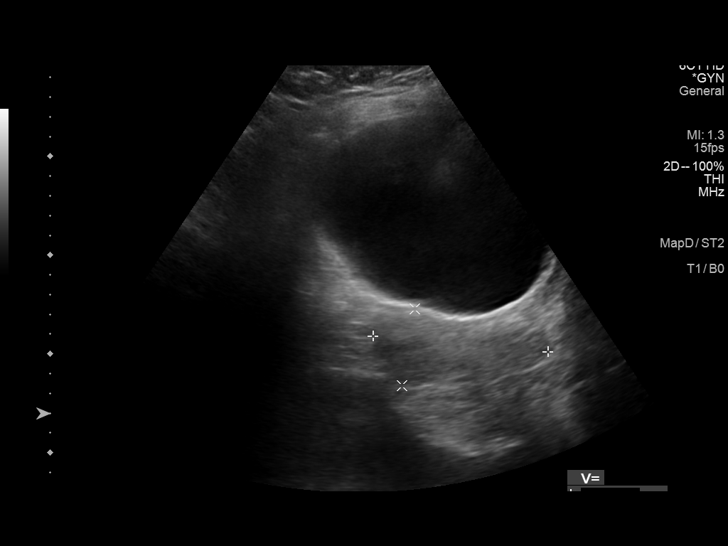
[im 5/49]
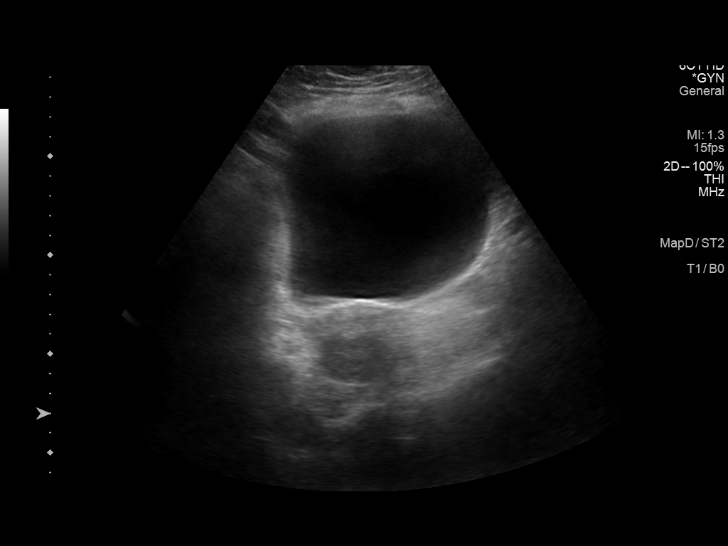
[im 9/49]
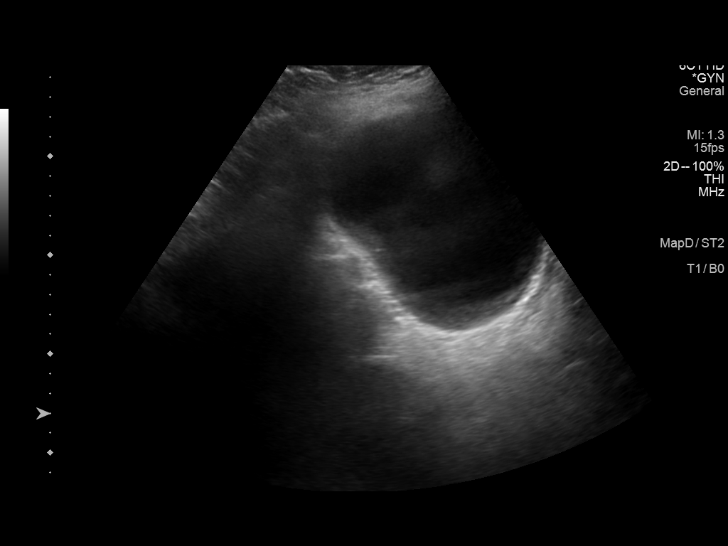
[im 14/49]
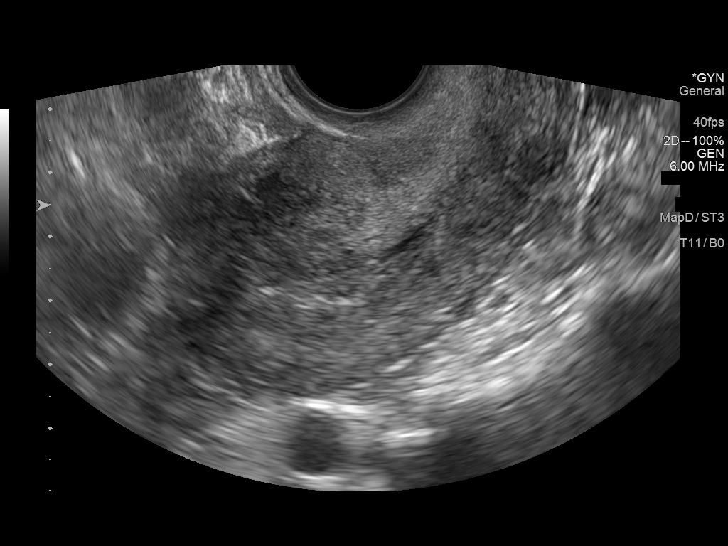
[im 18/49]
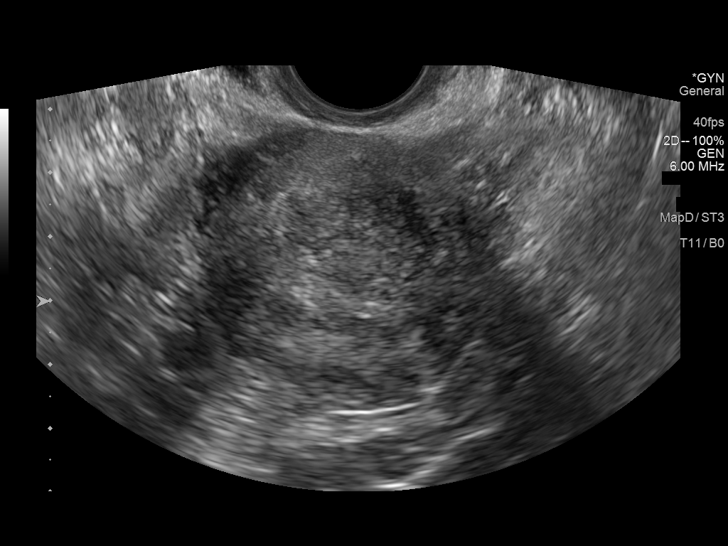
[im 22/49]
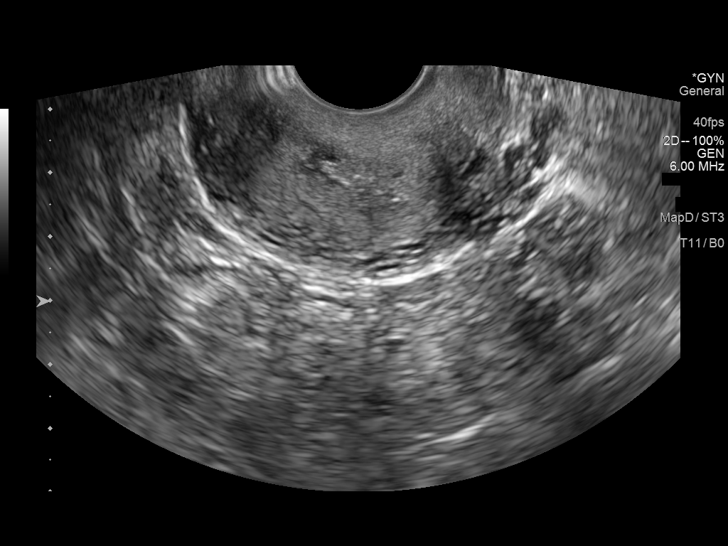
[im 27/49]
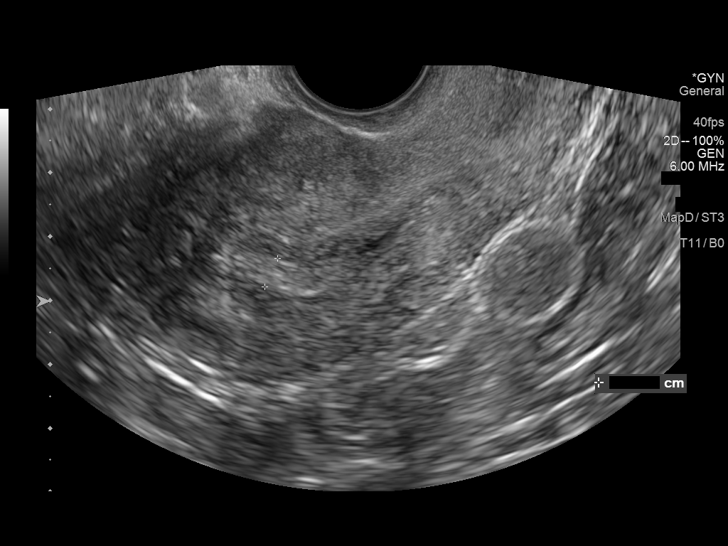
[im 31/49]
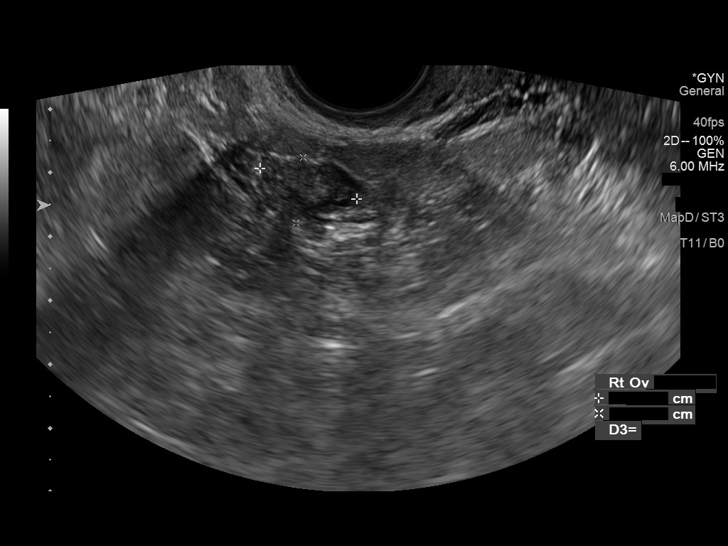
[im 35/49]
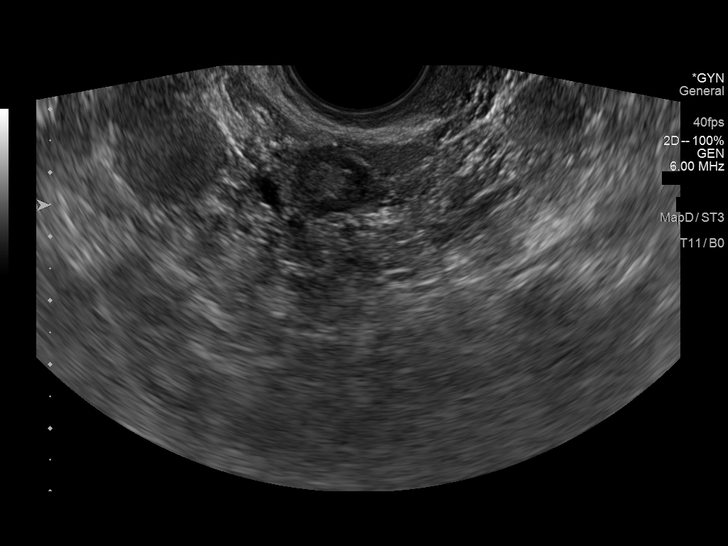
[im 40/49]
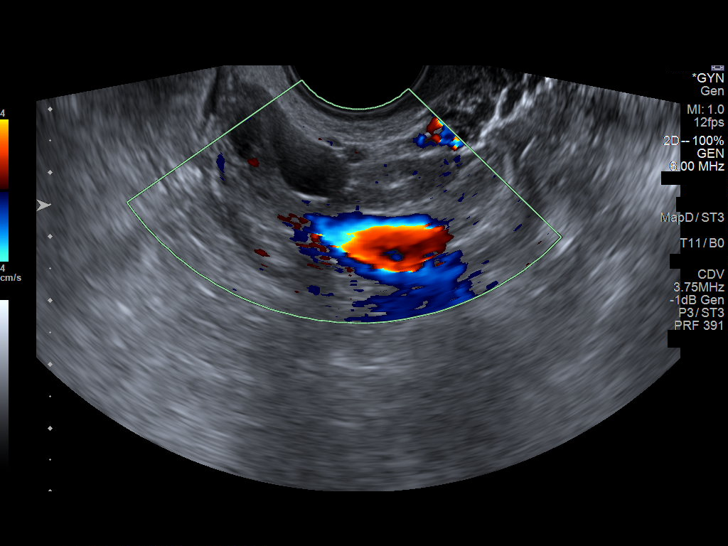
[im 44/49]
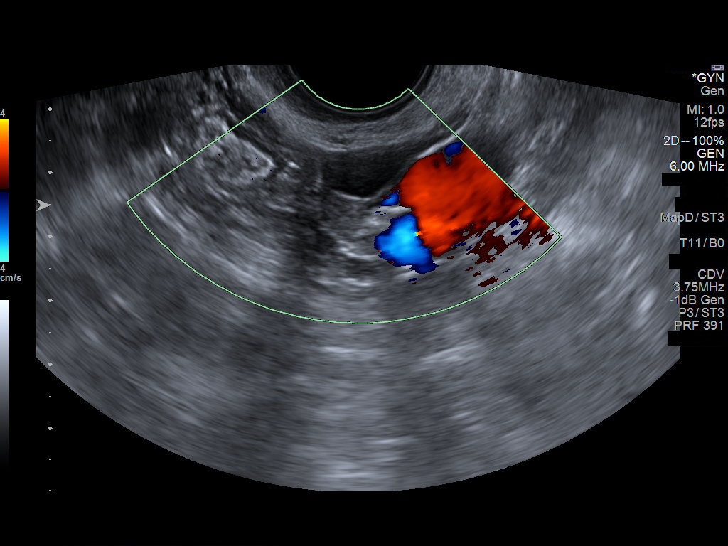
[im 49/49]
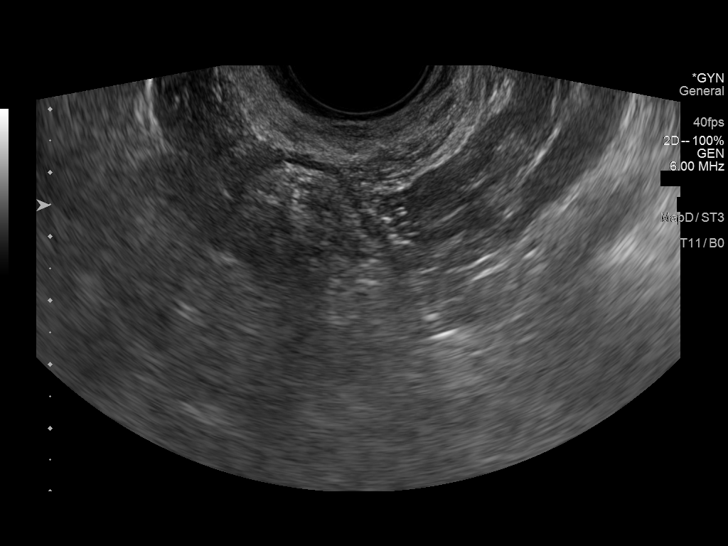

[Series 1002: us pelvis complete with transvaginal · 1 of 4 slices shown (2 of 2)]
[im 4/4]
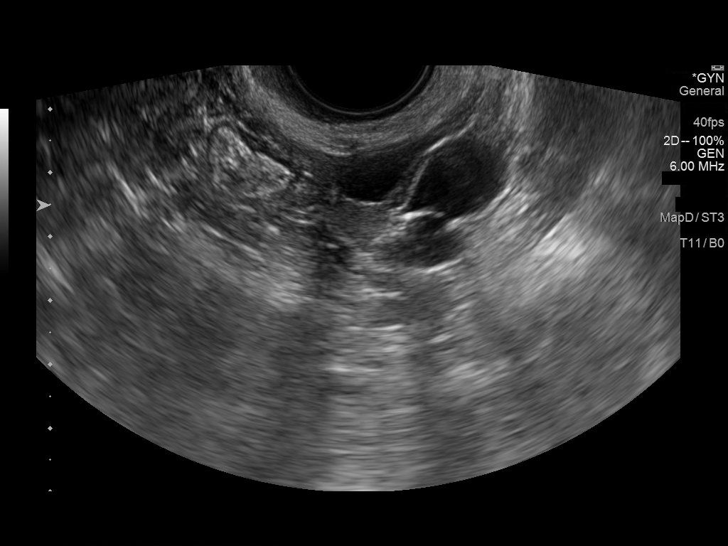

[13 of 25 positions shown; findings below may reference images not displayed]

FINDINGS: Uterus

Measurements: 8.9 x 3.9 x 4.8 cm = volume: 87.1 mL. No fibroids or
other mass visualized.

Endometrium

Thickness: 4.9 mm.  No focal abnormality visualized.

Right ovary

Measurements: 1.6 x 1 x 1.4 cm = volume: 1.2 mL. Normal
appearance/no adnexal mass.

Left ovary

Measurements: 3 x 1.5 x 1.7 cm = volume: 3.9 mL. Normal
appearance/no adnexal mass.

Other findings

No abnormal free fluid.
IMPRESSION: 1. Endometrial thickness of 4.9 mm. In the setting of
post-menopausal bleeding, this is consistent with a benign etiology
such as endometrial atrophy. If bleeding remains unresponsive to
hormonal or medical therapy, sonohysterogram should be considered
for focal lesion work-up. (Ref: Radiological Reasoning: Algorithmic
Workup of Abnormal Vaginal Bleeding with Endovaginal Sonography and
Sonohysterography. AJR [I8]; 191:S68-73)
2. Otherwise negative pelvic ultrasound

## 2021-03-09 ENCOUNTER — Other Ambulatory Visit: Payer: BC Managed Care – PPO

## 2021-04-12 ENCOUNTER — Other Ambulatory Visit: Payer: BC Managed Care – PPO

## 2021-04-20 DIAGNOSIS — Z8616 Personal history of COVID-19: Secondary | ICD-10-CM

## 2021-04-20 HISTORY — DX: Personal history of COVID-19: Z86.16

## 2021-05-03 NOTE — Progress Notes (Signed)
Patient Care Team: Spry, Marsh Dolly., MD as PCP - General (Family Medicine) Jovita Kussmaul, MD as Consulting Physician (General Surgery) Nicholas Lose, MD as Consulting Physician (Hematology and Oncology) Eppie Gibson, MD as Attending Physician (Radiation Oncology)  DIAGNOSIS:    ICD-10-CM   1. Malignant neoplasm of upper-inner quadrant of right breast in female, estrogen receptor positive ([place])  C50.211    Z17.0       SUMMARY OF ONCOLOGIC HISTORY: Oncology History  Malignant neoplasm of upper-inner quadrant of right breast in female, estrogen receptor positive ([place])  12/12/2018 Initial Diagnosis   Screening mammogram detected a distortion in the right breast, measuring 41m on diagnostic mammogram and [place] Biopsy confirmed grade 1 IDC with DCIS, HER-2 negative by FISH, ER 90%, PR 90%, Ki67 10%.    12/19/2018 Cancer Staging   Staging form: Breast, AJCC 8th Edition - Clinical stage from 12/19/2018: Stage IA (cT1b, cN0, cM0, G1, ER+, PR+, HER2-) - Signed by GNicholas Lose MD on 12/19/2018    01/02/2019 Genetic Testing   Negative genetic testing on the [facility] Breast Cancer STAT panel. The STAT Breast cancer panel offered by [facility] includes sequencing and rearrangement analysis for the following 9 genes:  ATM, BRCA1, BRCA2, CDH1, CHEK2, PALB2, PTEN, STK11 and TP53. The report date is 01/02/2019.   01/17/2019 Surgery   Right lumpectomy (Marlou Starks: IDC with DCIS, 1.2cm, grade 1, clear margins, 2 axillary lymph nodes negative.   01/17/2019 Cancer Staging   Staging form: Breast, AJCC 8th Edition - Pathologic stage from 01/17/2019: Stage IA (pT1c, pN0, cM0, G1, ER+, PR+, HER2-, Oncotype DX score: 24) - Signed by CGardenia Phlegm NP on 05/27/2019    01/31/2019 Oncotype testing   Score of 24 with 10% chance of distant recurrence in 9 years without systemic treatment.    02/15/2019 - 03/15/2019 Radiation Therapy   Adjuvant radiation treatment   03/2019 -  Anti-estrogen oral therapy    Anastrozole     CHIEF COMPLIANT: Follow-up of right breast cancer on anastrozole   INTERVAL HISTORY: Debbie MAIDAH FORQUERis a 50y.o. with above-mentioned history of right breast cancer who underwent a lumpectomy, radiation, and is currently on antiestrogen therapy with anastrozole. Mammogram on 12/10/2020 showed no evidence of malignancy bilaterally. She presents to the clinic today for follow-up.   ALLERGIES:  is allergic to penicillins and nsaids.  MEDICATIONS:  Current Outpatient Medications  Medication Sig Dispense Refill   anastrozole (ARIMIDEX) 1 MG tablet Take 1 tablet (1 mg total) by mouth daily. 90 tablet 3   calcium carbonate (OSCAL) 1500 (600 Ca) MG TABS tablet Take by mouth 2 (two) times daily with a meal. Calcium caltrate     cholecalciferol (VITAMIN D3) 25 MCG (1000 UT) tablet Take 1,000 Units by mouth daily. 2000 IU     COLLAGEN PO Take by mouth.     Cyanocobalamin (VITAMIN B 12 PO) Take by mouth.     Omega-3 Fatty Acids (FISH OIL) 1200 MG CAPS Take by mouth.     Sod Picosulfate-Mag Ox-Cit Acd (CLENPIQ) 10-3.5-12 MG-GM -GM/160ML SOLN Take 1 kit by mouth as directed. Pt will bring a coupon 320 mL 0   No current facility-administered medications for this visit.    PHYSICAL EXAMINATION: ECOG PERFORMANCE STATUS: 1 - Symptomatic but completely ambulatory  There were no vitals filed for this visit. There were no vitals filed for this visit.  BREAST: No palpable masses or nodules in either right or left breasts. No palpable axillary supraclavicular or infraclavicular  adenopathy no breast tenderness or nipple discharge. (exam performed in the presence of a chaperone)  LABORATORY DATA:  I have reviewed the data as listed CMP Latest Ref Rng & Units 12/19/2018  Glucose 70 - 99 mg/dL 93  BUN 6 - 20 mg/dL 10  Creatinine 0.44 - 1.00 mg/dL 0.80  Sodium 135 - 145 mmol/L 139  Potassium 3.5 - 5.1 mmol/L 3.6  Chloride 98 - 111 mmol/L 106  CO2 22 - 32 mmol/L 23  Calcium 8.9 - 10.3 mg/dL  8.9  Total Protein 6.5 - 8.1 g/dL 7.3  Total Bilirubin 0.3 - 1.2 mg/dL 0.4  Alkaline Phos 38 - 126 U/L 34(L)  AST 15 - 41 U/L 20  ALT 0 - 44 U/L 32    Lab Results  Component Value Date   WBC 6.4 12/19/2018   HGB 14.6 12/19/2018   HCT 44.8 12/19/2018   MCV 88.2 12/19/2018   PLT 256 12/19/2018   NEUTROABS 4.4 12/19/2018    ASSESSMENT & PLAN:  Malignant neoplasm of upper-inner quadrant of right breast in female, estrogen receptor positive ([place]) 12/12/2018:Screening mammogram detected a distortion in the right breast, measuring 72m on diagnostic mammogram and [place] Biopsy confirmed grade 1 IDC with DCIS, HER-2 negative by FISH, ER 90%, PR 90%, Ki67 10%.   01/17/19: Rt Lumpectomy IDC with DCIS, 1.2cm, grade 1, clear margins, 2 axillary lymph nodes negative.HER-2 negative by FISH, ER 90%, PR 90%, Ki67 10%.   Oncotype DX score 24: 10% ROR Adjuvant radiation therapy 02/07/2019-03/15/2011   Current treatment: Antiestrogen therapy with anastrozole started October 2020.  This was switched to tamoxifen February 2021 , switched to anastrozole May 2022     Anastrozole toxicities: Denies any hot flashes or arthralgias. Intense right leg pain that lasted for 1 to 2 days.  Unclear etiology it has resolved.  Breast cancer surveillance: 1.  Breast exam 05/04/2021: Benign 2. mammogram 12/10/2020: Benign breast density category C, fat necrosis at the site of lumpectomy Bone density 11/25/2020: T score -1: Normal CT abdomen pelvis 01/21/2021: Negative Pelvic ultrasound 03/04/2021: Endometrial thickness 4.9 mm: Benign  She gets a abbreviated breast MRI every year in December.  She pays $400 out-of-pocket for this.   Return to clinic in 1 year for follow-up.      No orders of the defined types were placed in this encounter.  The patient has a good understanding of the overall plan. she agrees with it. she will call with any problems that may develop before the next visit here.  Total time spent: 20  mins including face to face time and time spent for planning, charting and coordination of care  VRulon Eisenmenger MD, MPH 05/04/2021  I, KThana Ates am acting as scribe for Dr. VNicholas Lose  I have reviewed the above documentation for accuracy and completeness, and I agree with the above.

## 2021-05-04 ENCOUNTER — Other Ambulatory Visit: Payer: Self-pay

## 2021-05-04 ENCOUNTER — Inpatient Hospital Stay: Payer: BC Managed Care – PPO | Attending: Hematology and Oncology | Admitting: Hematology and Oncology

## 2021-05-04 DIAGNOSIS — Z170 Estrogen receptor positive status [ER+]: Secondary | ICD-10-CM

## 2021-05-04 DIAGNOSIS — Z79811 Long term (current) use of aromatase inhibitors: Secondary | ICD-10-CM | POA: Diagnosis not present

## 2021-05-04 DIAGNOSIS — Z923 Personal history of irradiation: Secondary | ICD-10-CM | POA: Diagnosis not present

## 2021-05-04 DIAGNOSIS — C50211 Malignant neoplasm of upper-inner quadrant of right female breast: Secondary | ICD-10-CM

## 2021-05-04 MED ORDER — ANASTROZOLE 1 MG PO TABS: 1.0000 mg | ORAL_TABLET | Freq: Every day | ORAL | 3 refills | Status: DC

## 2021-05-04 NOTE — Assessment & Plan Note (Signed)
12/12/2018:Screening mammogram detected a distortion in the right breast, measuring 10m on diagnostic mammogram and [place] Biopsy confirmed grade 1 IDC with DCIS, HER-2 negative by FISH, ER 90%, PR 90%, Ki67 10%.  01/17/19:Rt LumpectomyIDC with DCIS, 1.2cm, grade 1, clear margins, 2 axillary lymph nodes negative.HER-2 negative by FISH, ER 90%, PR 90%, Ki67 10%.  Oncotype DX score 24: 10% ROR Adjuvant radiation therapy 02/07/2019-03/15/2011  Current treatment: Antiestrogen therapy with anastrozole started October 2020. This was switched to tamoxifen February 2021 , switched to anastrozole May 2022    Anastrozole toxicities:Denies any hot flashes or arthralgias. Intense right leg pain that lasted for 1 to 2 days.  Unclear etiology it has resolved.  Breast cancer surveillance: 1.  Breast exam 05/04/2021: Benign 2. mammogram 12/10/2020: Benign breast density category C, fat necrosis at the site of lumpectomy Bone density 11/25/2020: T score -1: Normal CT abdomen pelvis 01/21/2021: Negative Pelvic ultrasound 03/05/2019: Endometrial thickness 4.9 mm: Benign  She gets a abbreviated breast MRI every year in December.  She pays $400 out-of-pocket for this.  Return to clinic in 1 year for follow-up.

## 2021-05-25 ENCOUNTER — Other Ambulatory Visit: Payer: Self-pay

## 2021-05-25 ENCOUNTER — Ambulatory Visit
Admission: RE | Admit: 2021-05-25 | Discharge: 2021-05-25 | Disposition: A | Discharge status: Home / Self Care | Payer: BC Managed Care – PPO | Source: Ambulatory Visit | Attending: Hematology and Oncology | Admitting: Hematology and Oncology

## 2021-05-25 DIAGNOSIS — C50211 Malignant neoplasm of upper-inner quadrant of right female breast: Secondary | ICD-10-CM

## 2021-05-25 DIAGNOSIS — Z170 Estrogen receptor positive status [ER+]: Secondary | ICD-10-CM

## 2021-05-25 IMAGING — MR MR BREAST WO/W CM  BILAT
5 series · 30 of 48 positions shown · IV contrast (gadavist)
Comparison: Previous exam(s).

CLINICAL DATA: 50-year-old female with history of RIGHT breast
cancer, lumpectomy and radiation in [FI], and increased risk for
developing subsequent breast cancer. For screening breast MRI.

EXAM:
BILATERAL BREAST MRI WITH AND WITHOUT CONTRAST
TECHNIQUE: Multiplanar, multisequence MR images of both breasts were obtained
prior to and following the intravenous administration of 7 ml of
Gadavist

[Series 2: t2_tirm_tra ipat (a-p) · axial · 3.0mm · 0.70mm/px · z∈[-69,+81]mm · 5 of 51 slices shown]
[im 1/51]
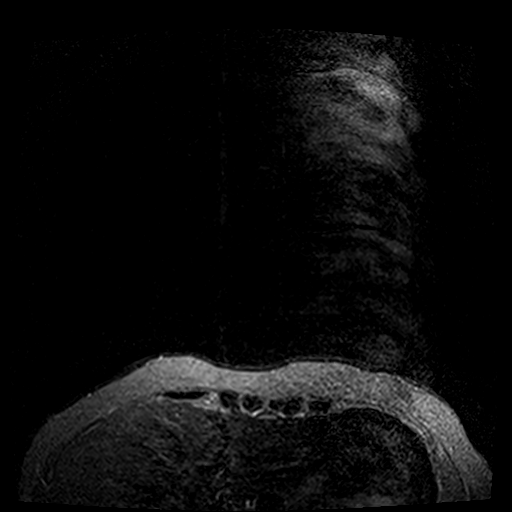
[im 13/51]
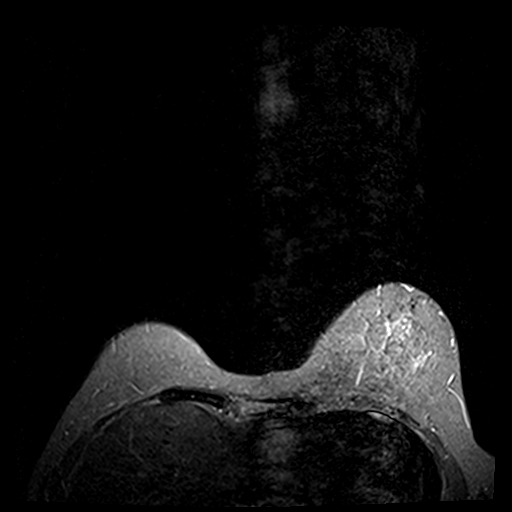
[im 26/51]
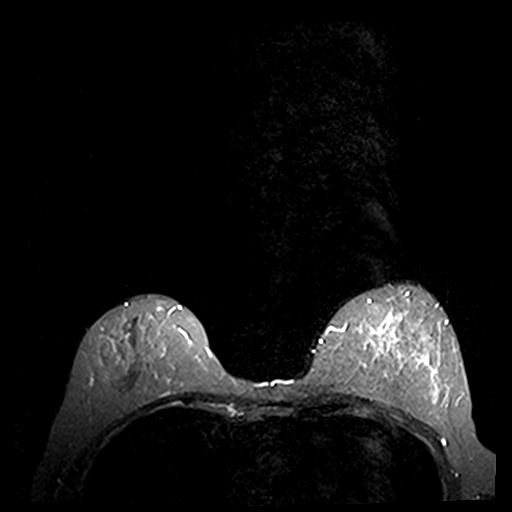
[im 38/51]
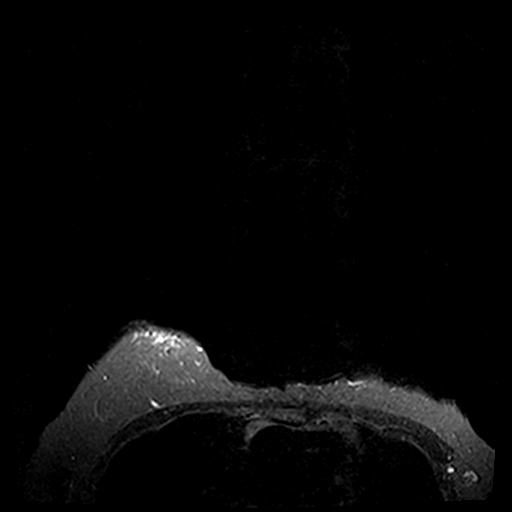
[im 51/51]
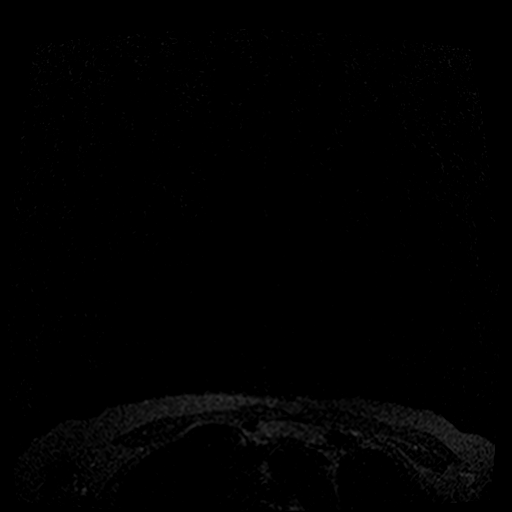

[Series 3: fl3d pre-cm · axial · non-contrast · 1.2mm · 0.94mm/px · z∈[-65,+78]mm · 8 of 120 slices shown]
[im 1/120]
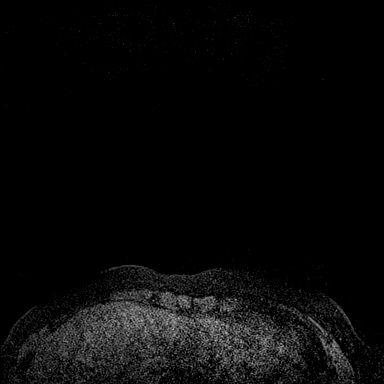
[im 19/120]
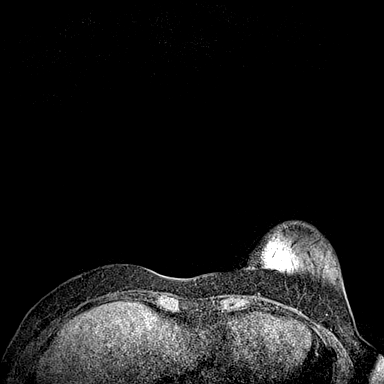
[im 37/120]
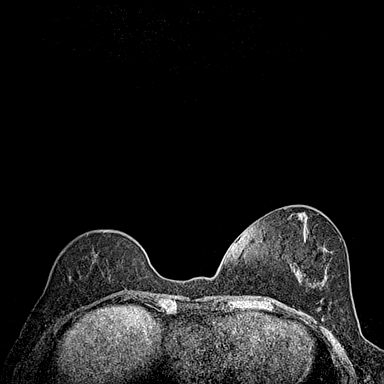
[im 55/120]
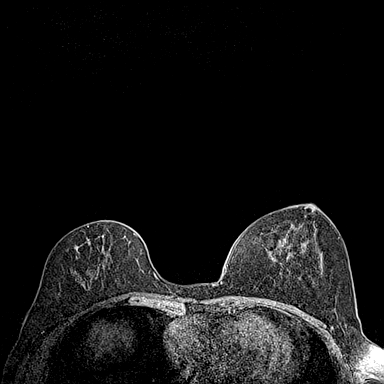
[im 65/120]
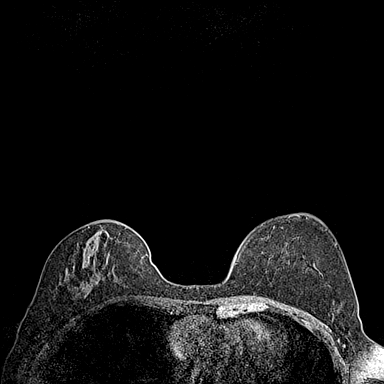
[im 83/120]
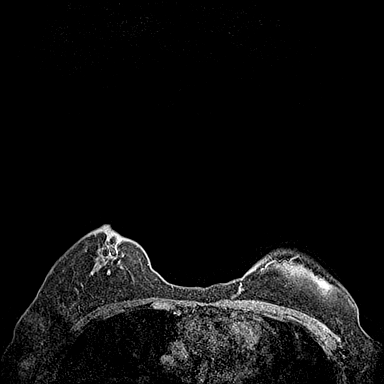
[im 101/120]
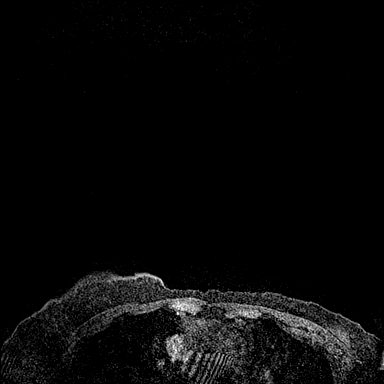
[im 120/120]
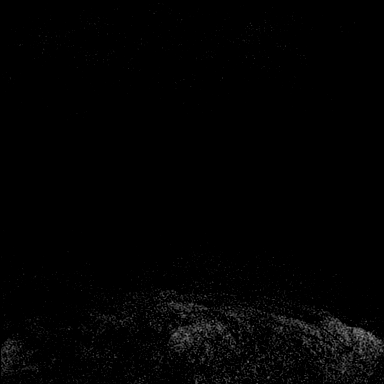

[Series 4: fl3d post-cm 20 · axial · 1.2mm · 0.94mm/px · z∈[-65,+78]mm · 8 of 120 slices shown (1 of 3)]
[im 1/120]
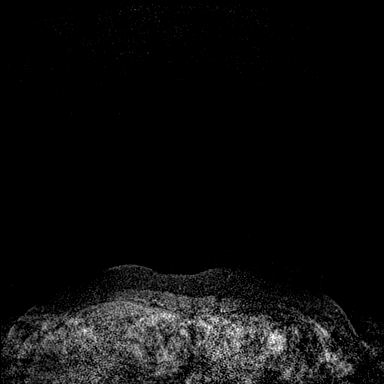
[im 19/120]
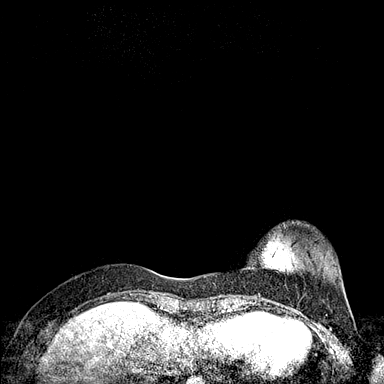
[im 37/120]
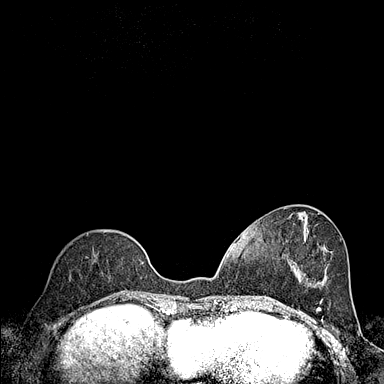
[im 55/120]
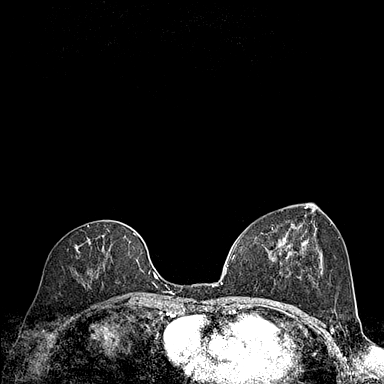
[im 65/120]
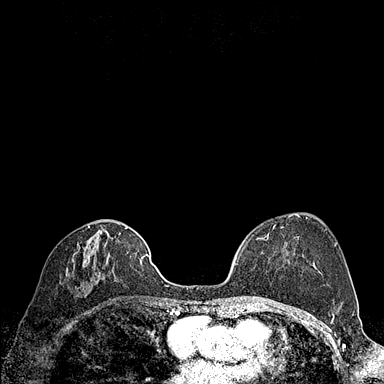
[im 83/120]
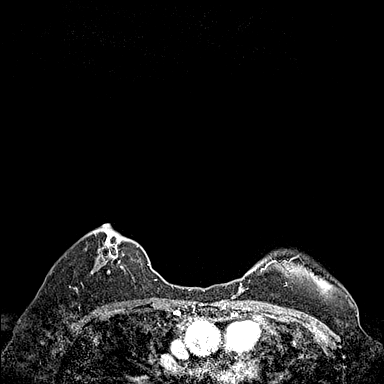
[im 101/120]
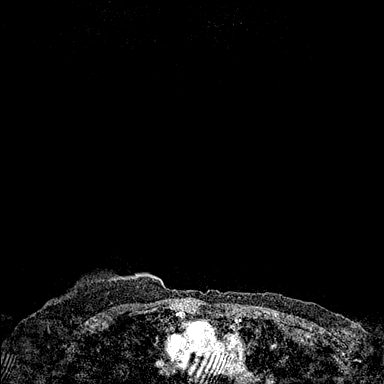
[im 120/120]
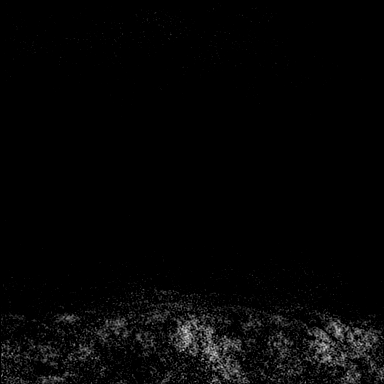

[Series 5: fl3d post-cm 20 · axial · 1.2mm · 0.94mm/px · z∈[-65,+78]mm · 8 of 120 slices shown (2 of 3)]
[im 1/120]
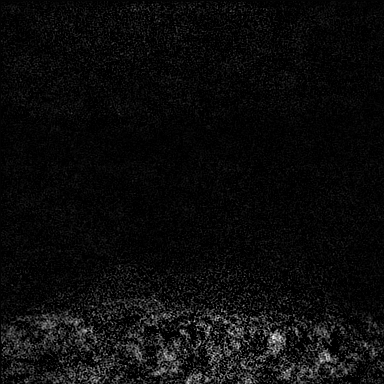
[im 19/120]
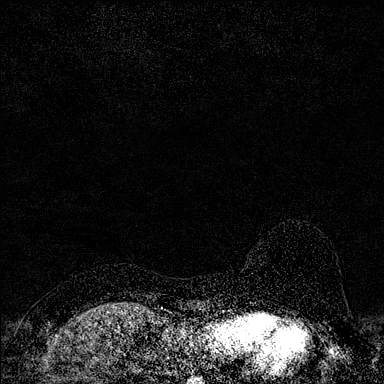
[im 37/120]
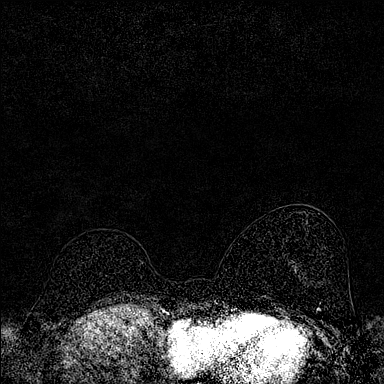
[im 55/120]
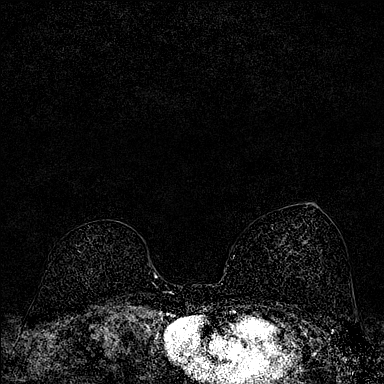
[im 65/120]
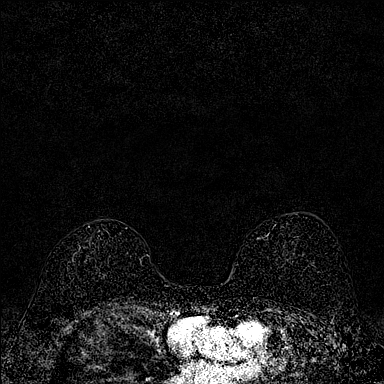
[im 83/120]
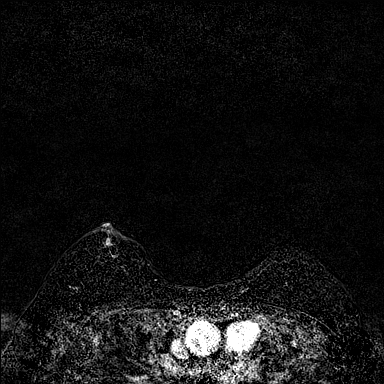
[im 101/120]
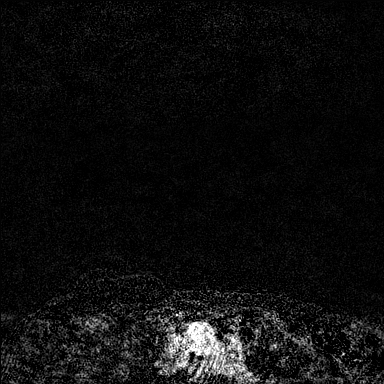
[im 120/120]
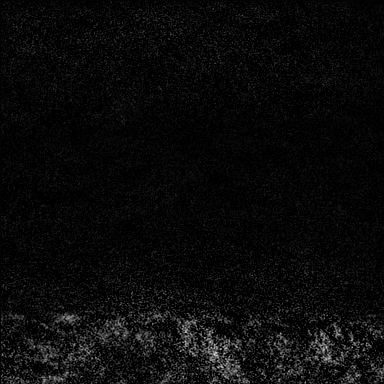

[Series 6: fl3d post-cm 20 · axial · 144.0mm · 0.94mm/px · 1 of 1 slices shown (3 of 3)]
[im 1/1]
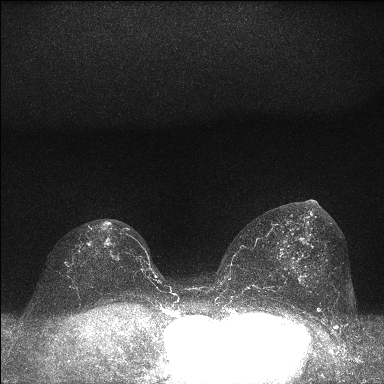

[30 of 48 positions shown; findings below may reference images not displayed]

Three-dimensional MR images were rendered by post-processing of the
original MR data on an independent workstation. The
three-dimensional MR images were interpreted, and findings are
reported in the following complete MRI report for this study. Three
dimensional images were evaluated at the independent interpreting
workstation using the DynaCAD thin client.
FINDINGS: Breast composition: c. Heterogeneous fibroglandular tissue.

Background parenchymal enhancement: Moderate.

Right breast: No mass or abnormal enhancement. RIGHT lumpectomy
changes again noted.

Left breast: No mass or abnormal enhancement.

Lymph nodes: No abnormal appearing lymph nodes.

Ancillary findings:  None.
IMPRESSION: No MR evidence of breast malignancy.

RECOMMENDATION:
Bilateral screening mammogram in [DATE] to resume annual
mammogram schedule.

Bilateral screening breast MRI in 1 year as clinically indicated.

BI-RADS CATEGORY  2: Benign.

## 2021-05-25 MED ORDER — GADOBUTROL 1 MMOL/ML IV SOLN
7.0000 mL | Freq: Once | INTRAVENOUS | Status: AC | PRN
  Administered 2021-05-25: 7 mL via INTRAVENOUS

## 2021-06-01 ENCOUNTER — Other Ambulatory Visit: Payer: Self-pay

## 2021-06-01 DIAGNOSIS — Z170 Estrogen receptor positive status [ER+]: Secondary | ICD-10-CM

## 2021-06-01 NOTE — Progress Notes (Signed)
Pt called to obtain MRI results. Pt was given results per MD and knows she will have bilateral screening MM in June. Pt verbalized thanks and understanding.

## 2021-06-04 ENCOUNTER — Telehealth: Payer: Self-pay

## 2021-06-04 NOTE — Telephone Encounter (Signed)
Pt called concerning mm scheduling, pt agreeable to schedule in June. No further concerns at this time.

## 2021-09-13 ENCOUNTER — Other Ambulatory Visit: Payer: Self-pay

## 2021-09-13 ENCOUNTER — Encounter (HOSPITAL_BASED_OUTPATIENT_CLINIC_OR_DEPARTMENT_OTHER): Payer: Self-pay | Admitting: Obstetrics and Gynecology

## 2021-09-13 NOTE — Progress Notes (Signed)
Spoke w/ via phone for pre-op interview--- pt ?Lab needs dos---- cbc, t&s              ?Lab results------ has lab work result in epic dated 08-26-2021, CBCdiff/ CMP ?COVID test -----patient states asymptomatic no test needed ?Arrive at -------  1015 on 09-15-2021 ?NPO after MN NO Solid Food.  Clear liquids from MN until--- 0915 ?Med rec completed ?Medications to take morning of surgery ----- none ?Diabetic medication ----- n/a ?Patient instructed no nail polish to be worn day of surgery ?Patient instructed to bring photo id and insurance card day of surgery ?Patient aware to have Driver (ride ) / caregiver for 24 hours after surgery --husband, jeffrey ?Patient Special Instructions ----- n/a ?Pre-Op special Istructions ----- n/a ?Patient verbalized understanding of instructions that were given at this phone interview. ?Patient denies shortness of breath, chest pain, fever, cough at this phone interview.  ?

## 2021-09-14 NOTE — H&P (Signed)
Debbie Berry is an 51 y.o. female P2 with recurrent postmenopausal bleeding.  ? ?History of intermittent postmenopausal bleeding since April 2022. ?Had EMB 09/24/20: FRAGMENTS OF INACTIVE  ?ENDOMETRIUM, AND POLYPOID FRAGMENTS SUGGESTIVE OF BENIGN  ?ENDOMETRIAL POLYP. NO HYPERPLASIA OR CARCINOMA. ? ?03/04/21 Pelvic US ?Uterus ?Measurements: 8.9 x 3.9 x 4.8 cm = volume: 87.1 mL. No fibroids or ?other mass visualized. ?Endometrium ?Thickness: 4.9 mm. No focal abnormality visualized. ?Right ovary ?Measurements: 1.6 x 1 x 1.4 cm = volume: 1.2 mL. Normal ?appearance/no adnexal mass. ?Left ovary ?Measurements: 3 x 1.5 x 1.7 cm = volume: 3.9 mL. Normal ?appearance/no adnexal mass. ? ?01/21/21 CT abd/pelv ?A 1 cm subserosal fibroid is again seen arising from ?the uterine fundus. No other pelvic masses are identified. No ?evidence of inflammatory process or abnormal fluid collections. ? ?H/o tamoxifen x 1 year due to breast cancer, now on anastrazole ? ? ?History of SVD x 2 ?Menstrual History: ? ?Patient's last menstrual period was 12/19/2018 (exact date). ?  ? ?Past Medical History:  ?Diagnosis Date  ? Family history of cervical cancer   ? History of COVID-19 04/2021  ? per pt mild symptoms that resolved  ? History of external beam radiation therapy   ? 02-15-2019  to 03-15-2019  right breast cancer  ? Hyperlipidemia   ? Malignant neoplasm of upper-inner quadrant of right breast in female, estrogen receptor positive ([place]) 11/2018  ? oncologist--- dr Lindi Adie;  dx 06/ 2020,  IDC , Stage IA, ER/ PR +;  completed radiation 03-15-2019  ? Migraine   ? PMB (postmenopausal bleeding)   ? Vertigo   ? Wears contact lenses   ? ? ?Past Surgical History:  ?Procedure Laterality Date  ? BREAST BIOPSY Right 11/2018  ? BREAST LUMPECTOMY WITH RADIOACTIVE SEED AND SENTINEL LYMPH NODE BIOPSY Right 01/17/2019  ? Procedure: RIGHT BREAST LUMPECTOMY WITH RADIOACTIVE SEED AND SENTINEL LYMPH NODE BIOPSY;  Surgeon: Jovita Kussmaul, MD;  Location: [facility];  Service: General;  Laterality: Right;  ? CHOLECYSTECTOMY, LAPAROSCOPIC  2013  ? COLONOSCOPY  02/2020  ? INCISIONAL HERNIA REPAIR  2015  ? ? ?Family History  ?Problem Relation Age of Onset  ? Colon polyps Mother   ? Cervical cancer Paternal Grandmother   ? Cervical cancer Paternal Aunt 72  ? Colon cancer Neg Hx   ? Esophageal cancer Neg Hx   ? Rectal cancer Neg Hx   ? Stomach cancer Neg Hx   ? ? ?Social History:  reports that she quit smoking about 33 years ago. Her smoking use included cigarettes. She has never used smokeless tobacco. She reports current alcohol use. She reports that she does not use drugs. ? ?Allergies:  ?Allergies  ?Allergen Reactions  ? Azithromycin Anaphylaxis  ? Penicillins Shortness Of Breath  ? Nsaids Swelling  ?  Swelling to lips and eyes (PER PT ALL NSAIDS)  ? ? ?Medications Prior to Admission  ?Medication Sig Dispense Refill Last Dose  ? anastrozole (ARIMIDEX) 1 MG tablet Take 1 tablet (1 mg total) by mouth daily. (Patient taking differently: Take 1 mg by mouth at bedtime.) 90 tablet 3 09/14/2021  ? Cholecalciferol (VITAMIN D3) 50 MCG (2000 UT) TABS Take by mouth daily.   09/14/2021  ? COLLAGEN PO Take by mouth daily. POWDER   09/14/2021  ? Cyanocobalamin (VITAMIN B 12 PO) Take by mouth daily at 12 noon.   09/14/2021  ? Omega-3 Fatty Acids (FISH OIL) 1200 MG CAPS Take 1 capsule by mouth  daily.   09/14/2021  ? calcium carbonate (OSCAL) 1500 (600 Ca) MG TABS tablet Take by mouth 2 (two) times daily with a meal. Calcium caltrate (Patient not taking: Reported on 09/13/2021)   Not Taking  ? ? ?Review of Systems  ?Constitutional:  Negative for fever.  ?HENT:  Negative for sore throat.   ?Eyes:  Negative for visual disturbance.  ?Respiratory:  Negative for shortness of breath.   ?Cardiovascular:  Negative for chest pain.  ?Gastrointestinal:  Negative for abdominal pain.  ?Genitourinary:  Positive for menstrual problem.  ?Musculoskeletal:  Negative for myalgias.  ?Skin:  Negative for  rash.  ?Neurological:  Negative for headaches.  ?Psychiatric/Behavioral:  Negative for suicidal ideas.   ? ?Blood pressure 111/88, pulse 64, temperature 97.9 ?F (36.6 ?C), temperature source Oral, resp. rate 16, height '5\' 6"'$  (1.676 m), weight 67.8 kg, last menstrual period 12/19/2018, SpO2 100 %. ?Physical Exam ?Chaperone ?Chaperone: present ? ?Constitutional ?General Appearance: healthy-appearing, well-nourished, well-developed ? ?Psychiatric ?Orientation: to time, to place, to person ?Mood and Affect: active and alert, normal mood, normal affect ? ?Abdomen ?Auscultation/Inspection/Palpation: normal bowel sounds, soft, non-distended, no tenderness, no hepatomegaly, no splenomegaly, no masses, no CVA tenderness ?Hernia: none palpated ? ?Female Genitalia ?Vulva: no masses, no atrophy, no lesions ?Bladder/Urethra: normal meatus, no urethral discharge, no urethral mass, bladder non distended ?Vagina no tenderness, no erythema, no abnormal vaginal discharge, no vesicle(s) or ulcers, no cystocele, no rectocele (scant dark blood tinged discharge in vaginal vault) ?Cervix: grossly normal, no discharge, no cervical motion tenderness ?Uterus: normal size, normal shape, midline, mobile, non-tender, no uterine prolapse ?Adnexa/Parametria: no parametrial tenderness, no parametrial mass, no adnexal tenderness, no ovarian mass ? ?No results found for this or any previous visit (from the past 24 hour(s)). ? ?No results found. ? ?Assessment/Plan: ?51Y with recurrent postmenopausal bleeding, suspected polyp ?- Plan: Hysteroscopic polypectomy, dilation and curettage, possible hysteroscopic myomectomy ?- Informed consent obtained. Reviewed risk of infection, bleeding, uterine perforation, failure to achieve desired results. All questions answered.  ?- Allergy to NSAIDS - plan tylenol for pain control, oxycodone PRN ? ?Rowland Lathe ?09/15/2021, 11:30 AM ? ?

## 2021-09-15 ENCOUNTER — Ambulatory Visit (HOSPITAL_BASED_OUTPATIENT_CLINIC_OR_DEPARTMENT_OTHER): Payer: BC Managed Care – PPO | Admitting: Anesthesiology

## 2021-09-15 ENCOUNTER — Encounter (HOSPITAL_BASED_OUTPATIENT_CLINIC_OR_DEPARTMENT_OTHER): Payer: Self-pay | Admitting: Obstetrics and Gynecology

## 2021-09-15 ENCOUNTER — Encounter (HOSPITAL_BASED_OUTPATIENT_CLINIC_OR_DEPARTMENT_OTHER)
Admission: RE | Disposition: A | Discharge status: Home / Self Care | Payer: Self-pay | Source: Home / Self Care | Attending: Obstetrics and Gynecology

## 2021-09-15 ENCOUNTER — Ambulatory Visit (HOSPITAL_BASED_OUTPATIENT_CLINIC_OR_DEPARTMENT_OTHER)
Admission: RE | Admit: 2021-09-15 | Discharge: 2021-09-15 | Disposition: A | Discharge status: Home / Self Care | Payer: BC Managed Care – PPO | Attending: Obstetrics and Gynecology | Admitting: Obstetrics and Gynecology

## 2021-09-15 DIAGNOSIS — Z79811 Long term (current) use of aromatase inhibitors: Secondary | ICD-10-CM | POA: Insufficient documentation

## 2021-09-15 DIAGNOSIS — Z87891 Personal history of nicotine dependence: Secondary | ICD-10-CM | POA: Diagnosis not present

## 2021-09-15 DIAGNOSIS — Z853 Personal history of malignant neoplasm of breast: Secondary | ICD-10-CM | POA: Diagnosis not present

## 2021-09-15 DIAGNOSIS — N8501 Benign endometrial hyperplasia: Secondary | ICD-10-CM | POA: Diagnosis not present

## 2021-09-15 DIAGNOSIS — N950 Postmenopausal bleeding: Secondary | ICD-10-CM | POA: Insufficient documentation

## 2021-09-15 DIAGNOSIS — Z01818 Encounter for other preprocedural examination: Secondary | ICD-10-CM

## 2021-09-15 DIAGNOSIS — Z886 Allergy status to analgesic agent status: Secondary | ICD-10-CM | POA: Diagnosis not present

## 2021-09-15 HISTORY — PX: POLYPECTOMY: SHX5525

## 2021-09-15 HISTORY — DX: Presence of spectacles and contact lenses: Z97.3

## 2021-09-15 HISTORY — DX: Hyperlipidemia, unspecified: E78.5

## 2021-09-15 HISTORY — PX: DILATATION & CURETTAGE/HYSTEROSCOPY WITH MYOSURE: SHX6511

## 2021-09-15 HISTORY — DX: Postmenopausal bleeding: N95.0

## 2021-09-15 HISTORY — DX: Personal history of irradiation: Z92.3

## 2021-09-15 LAB — TYPE AND SCREEN
ABO/RH(D): A POS
Antibody Screen: NEGATIVE

## 2021-09-15 LAB — CBC
HCT: 41.6 % (ref 36.0–46.0)
Hemoglobin: 14 g/dL (ref 12.0–15.0)
MCH: 29.4 pg (ref 26.0–34.0)
MCHC: 33.7 g/dL (ref 30.0–36.0)
MCV: 87.4 fL (ref 80.0–100.0)
Platelets: 211 10*3/uL (ref 150–400)
RBC: 4.76 MIL/uL (ref 3.87–5.11)
RDW: 12 % (ref 11.5–15.5)
WBC: 4.4 10*3/uL (ref 4.0–10.5)
nRBC: 0 % (ref 0.0–0.2)

## 2021-09-15 LAB — ABO/RH: ABO/RH(D): A POS

## 2021-09-15 LAB — SURGICAL PATHOLOGY

## 2021-09-15 SURGERY — DILATATION & CURETTAGE/HYSTEROSCOPY WITH MYOSURE
Anesthesia: General | Site: Uterus

## 2021-09-15 MED ORDER — ACETAMINOPHEN 500 MG PO TABS
1000.0000 mg | ORAL_TABLET | Freq: Once | ORAL | Status: AC
  Administered 2021-09-15: 1000 mg via ORAL

## 2021-09-15 MED ORDER — ONDANSETRON HCL 4 MG/2ML IJ SOLN
INTRAMUSCULAR | Status: AC
  Filled 2021-09-15: qty 2

## 2021-09-15 MED ORDER — ACETAMINOPHEN 500 MG PO TABS
ORAL_TABLET | ORAL | Status: AC
  Filled 2021-09-15: qty 2

## 2021-09-15 MED ORDER — FENTANYL CITRATE (PF) 100 MCG/2ML IJ SOLN
INTRAMUSCULAR | Status: AC
  Filled 2021-09-15: qty 2

## 2021-09-15 MED ORDER — OXYCODONE HCL 5 MG PO TABS: 5.0000 mg | ORAL_TABLET | ORAL | 0 refills | Status: DC | PRN

## 2021-09-15 MED ORDER — ONDANSETRON HCL 4 MG/2ML IJ SOLN
INTRAMUSCULAR | Status: DC | PRN
  Administered 2021-09-15: 4 mg via INTRAVENOUS

## 2021-09-15 MED ORDER — ACETAMINOPHEN 325 MG PO TABS
650.0000 mg | ORAL_TABLET | Freq: Four times a day (QID) | ORAL | Status: DC | PRN
Start: 2021-09-15 — End: 2022-01-13

## 2021-09-15 MED ORDER — PHENYLEPHRINE 40 MCG/ML (10ML) SYRINGE FOR IV PUSH (FOR BLOOD PRESSURE SUPPORT)
PREFILLED_SYRINGE | INTRAVENOUS | Status: AC
  Filled 2021-09-15: qty 10

## 2021-09-15 MED ORDER — PHENYLEPHRINE 40 MCG/ML (10ML) SYRINGE FOR IV PUSH (FOR BLOOD PRESSURE SUPPORT)
PREFILLED_SYRINGE | INTRAVENOUS | Status: DC | PRN
  Administered 2021-09-15 (×2): 80 ug via INTRAVENOUS

## 2021-09-15 MED ORDER — ALBUMIN HUMAN 5 % IV SOLN
INTRAVENOUS | Status: AC
  Filled 2021-09-15: qty 250

## 2021-09-15 MED ORDER — MIDAZOLAM HCL 5 MG/5ML IJ SOLN
INTRAMUSCULAR | Status: DC | PRN
  Administered 2021-09-15: 2 mg via INTRAVENOUS

## 2021-09-15 MED ORDER — FENTANYL CITRATE (PF) 100 MCG/2ML IJ SOLN
25.0000 ug | INTRAMUSCULAR | Status: DC | PRN
  Administered 2021-09-15 (×3): 25 ug via INTRAVENOUS

## 2021-09-15 MED ORDER — DEXAMETHASONE SODIUM PHOSPHATE 10 MG/ML IJ SOLN
INTRAMUSCULAR | Status: AC
  Filled 2021-09-15: qty 1

## 2021-09-15 MED ORDER — LACTATED RINGERS IV SOLN: INTRAVENOUS | Status: DC

## 2021-09-15 MED ORDER — PROPOFOL 10 MG/ML IV BOLUS
INTRAVENOUS | Status: DC | PRN
  Administered 2021-09-15: 180 mg via INTRAVENOUS

## 2021-09-15 MED ORDER — LIDOCAINE HCL 1 % IJ SOLN
INTRAMUSCULAR | Status: DC | PRN
  Administered 2021-09-15: 10 mL

## 2021-09-15 MED ORDER — DEXAMETHASONE SODIUM PHOSPHATE 10 MG/ML IJ SOLN
INTRAMUSCULAR | Status: DC | PRN
  Administered 2021-09-15: 10 mg via INTRAVENOUS

## 2021-09-15 MED ORDER — LIDOCAINE HCL (PF) 2 % IJ SOLN
INTRAMUSCULAR | Status: AC
Start: 2021-09-15 — End: ?
  Filled 2021-09-15: qty 5

## 2021-09-15 MED ORDER — ALBUMIN HUMAN 5 % IV SOLN
12.5000 g | Freq: Once | INTRAVENOUS | Status: AC
  Administered 2021-09-15: 12.5 g via INTRAVENOUS

## 2021-09-15 MED ORDER — OXYCODONE HCL 5 MG PO TABS: 5.0000 mg | ORAL_TABLET | Freq: Once | ORAL | Status: DC | PRN

## 2021-09-15 MED ORDER — ACETAMINOPHEN 500 MG PO TABS: 1000.0000 mg | ORAL_TABLET | ORAL | Status: DC

## 2021-09-15 MED ORDER — MIDAZOLAM HCL 2 MG/2ML IJ SOLN
INTRAMUSCULAR | Status: AC
Start: 2021-09-15 — End: ?
  Filled 2021-09-15: qty 2

## 2021-09-15 MED ORDER — ATROPINE SULFATE 0.4 MG/ML IV SOLN
0.4000 mg | Freq: Once | INTRAVENOUS | Status: AC
Start: 2021-09-15 — End: 2021-09-15
  Administered 2021-09-15: 0.4 mg via INTRAVENOUS

## 2021-09-15 MED ORDER — PROPOFOL 10 MG/ML IV BOLUS
INTRAVENOUS | Status: AC
Start: 2021-09-15 — End: ?
  Filled 2021-09-15: qty 20

## 2021-09-15 MED ORDER — LIDOCAINE 2% (20 MG/ML) 5 ML SYRINGE
INTRAMUSCULAR | Status: DC | PRN
Start: 2021-09-15 — End: 2021-09-15
  Administered 2021-09-15: 60 mg via INTRAVENOUS

## 2021-09-15 MED ORDER — FENTANYL CITRATE (PF) 100 MCG/2ML IJ SOLN
INTRAMUSCULAR | Status: DC | PRN
Start: 2021-09-15 — End: 2021-09-15
  Administered 2021-09-15: 25 ug via INTRAVENOUS
  Administered 2021-09-15: 50 ug via INTRAVENOUS

## 2021-09-15 MED ORDER — ATROPINE SULFATE 0.4 MG/ML IV SOLN
INTRAVENOUS | Status: AC
  Filled 2021-09-15: qty 1

## 2021-09-15 MED ORDER — OXYCODONE HCL 5 MG/5ML PO SOLN: 5.0000 mg | Freq: Once | ORAL | Status: DC | PRN

## 2021-09-15 MED ORDER — ONDANSETRON HCL 4 MG/2ML IJ SOLN
4.0000 mg | Freq: Once | INTRAMUSCULAR | Status: AC | PRN
  Administered 2021-09-15: 4 mg via INTRAVENOUS

## 2021-09-15 SURGICAL SUPPLY — 18 items
CATH ROBINSON RED A/P 16FR (CATHETERS) ×3 IMPLANT
DEVICE MYOSURE LITE (MISCELLANEOUS) ×2 IMPLANT
DEVICE MYOSURE REACH (MISCELLANEOUS) IMPLANT
DRSG TELFA 3X8 NADH (GAUZE/BANDAGES/DRESSINGS) ×3 IMPLANT
GAUZE 4X4 16PLY ~~LOC~~+RFID DBL (SPONGE) ×3 IMPLANT
GLOVE SURG ENC MOIS LTX SZ6 (GLOVE) ×3 IMPLANT
GLOVE SURG UNDER POLY LF SZ6.5 (GLOVE) ×3 IMPLANT
GOWN STRL REUS W/ TWL LRG LVL3 (GOWN DISPOSABLE) ×2 IMPLANT
GOWN STRL REUS W/TWL LRG LVL3 (GOWN DISPOSABLE) ×3
IV NS IRRIG 3000ML ARTHROMATIC (IV SOLUTION) ×2 IMPLANT
KIT PROCEDURE FLUENT (KITS) ×3 IMPLANT
KIT TURNOVER CYSTO (KITS) ×3 IMPLANT
PACK VAGINAL MINOR WOMEN LF (CUSTOM PROCEDURE TRAY) ×3 IMPLANT
PAD DRESSING TELFA 3X8 NADH (GAUZE/BANDAGES/DRESSINGS) ×1 IMPLANT
PAD OB MATERNITY 4.3X12.25 (PERSONAL CARE ITEMS) ×3 IMPLANT
SEAL ROD LENS SCOPE MYOSURE (ABLATOR) ×3 IMPLANT
TOWEL OR 17X26 10 PK STRL BLUE (TOWEL DISPOSABLE) ×3 IMPLANT
UNDERPAD 30X36 HEAVY ABSORB (UNDERPADS AND DIAPERS) ×3 IMPLANT

## 2021-09-15 NOTE — Anesthesia Procedure Notes (Signed)
Procedure Name: LMA Insertion ?Date/Time: 09/15/2021 11:42 AM ?Performed by: Rogers Blocker, CRNA ?Pre-anesthesia Checklist: Patient identified, Emergency Drugs available, Suction available and Patient being monitored ?Patient Re-evaluated:Patient Re-evaluated prior to induction ?Oxygen Delivery Method: Circle System Utilized ?Preoxygenation: Pre-oxygenation with 100% oxygen ?Induction Type: IV induction ?Ventilation: Mask ventilation without difficulty ?LMA: LMA inserted ?LMA Size: 4.0 ?Number of attempts: 1 ?Placement Confirmation: positive ETCO2 ?Tube secured with: Tape ?Dental Injury: Teeth and Oropharynx as per pre-operative assessment  ? ? ? ? ?

## 2021-09-15 NOTE — Anesthesia Preprocedure Evaluation (Addendum)
Anesthesia Evaluation  ?Patient identified by MRN, date of birth, ID band ?Patient awake ? ? ? ?Reviewed: ?Allergy & Precautions, NPO status , Patient's Chart, lab work & pertinent test results ? ?History of Anesthesia Complications ?Negative for: history of anesthetic complications ? ?Airway ?Mallampati: II ? ?TM Distance: >3 FB ?Neck ROM: Full ? ? ? Dental ? ?(+) Dental Advisory Given, Teeth Intact ?  ?Pulmonary ?former smoker,  ?  ?Pulmonary exam normal ? ? ? ? ? ? ? Cardiovascular ?negative cardio ROS ?Normal cardiovascular exam ? ? ?  ?Neuro/Psych ? Headaches,  ?Vertigo ? ?negative psych ROS  ? GI/Hepatic ?negative GI ROS, Neg liver ROS,   ?Endo/Other  ?negative endocrine ROS ? Renal/GU ?negative Renal ROS  ? ?  ?Musculoskeletal ?negative musculoskeletal ROS ?(+)  ? Abdominal ?  ?Peds ? Hematology ?negative hematology ROS ?(+)   ?Anesthesia Other Findings ? ? Reproductive/Obstetrics ? ?  ? ? ? ? ? ? ? ? ? ? ? ? ? ?  ?  ? ? ? ? ? ? ? ?Anesthesia Physical ?Anesthesia Plan ? ?ASA: 2 ? ?Anesthesia Plan: General  ? ?Post-op Pain Management: Tylenol PO (pre-op)*  ? ?Induction: Intravenous ? ?PONV Risk Score and Plan: 3 and Treatment may vary due to age or medical condition, Ondansetron, Dexamethasone and Midazolam ? ?Airway Management Planned: LMA ? ?Additional Equipment: None ? ?Intra-op Plan:  ? ?Post-operative Plan: Extubation in OR ? ?Informed Consent: I have reviewed the patients History and Physical, chart, labs and discussed the procedure including the risks, benefits and alternatives for the proposed anesthesia with the patient or authorized representative who has indicated his/her understanding and acceptance.  ? ? ? ?Dental advisory given ? ?Plan Discussed with: CRNA and Anesthesiologist ? ?Anesthesia Plan Comments:   ? ? ? ? ? ?Anesthesia Quick Evaluation ? ?

## 2021-09-15 NOTE — Brief Op Note (Signed)
09/15/2021 ? ?12:12 PM ? ?PATIENT:  Debbie Berry  51 y.o. female ? ?PRE-OPERATIVE DIAGNOSIS:  post-menopausal bleeding, suspected polyp ? ?POST-OPERATIVE DIAGNOSIS:  post-menopausal bleeding, endometrial polyp ? ?PROCEDURE:  Procedure(s): ?DILATATION & CURETTAGE/HYSTEROSCOPY (N/A) ?POLYPECTOMY WITH MYOSURE (N/A) ? ?SURGEON:  Surgeon(s) and Role: ?   * Rowland Lathe, MD - Primary ? ? ?ANESTHESIA:   local and general ? ?EBL:  5 mL  ? ?BLOOD ADMINISTERED:none ? ?DRAINS: none  ? ?LOCAL MEDICATIONS USED:  LIDOCAINE  and Amount: 10 ml ? ?SPECIMEN:  Source of Specimen:  endometrial curettings ? ?DISPOSITION OF SPECIMEN:  PATHOLOGY ? ?COUNTS:  YES ? ?TOURNIQUET:  * No tourniquets in log * ? ?DICTATION: .Note written in EPIC ? ?PLAN OF CARE: Discharge to home after PACU ? ?PATIENT DISPOSITION:  PACU - hemodynamically stable. ?  ?Delay start of Pharmacological VTE agent (>24hrs) due to surgical blood loss or risk of bleeding: not applicable ? ?

## 2021-09-15 NOTE — Op Note (Signed)
OPERATIVE REPORT ? ?09/15/2021 ?  ?12:12 PM ?  ?PATIENT:  Debbie Berry  51 y.o. female ?  ?PRE-OPERATIVE DIAGNOSIS:  post-menopausal bleeding, suspected polyp ?  ?POST-OPERATIVE DIAGNOSIS:  post-menopausal bleeding, endometrial polyp ?  ?PROCEDURE:  Procedure(s): ?DILATATION & CURETTAGE/HYSTEROSCOPY (N/A) ?POLYPECTOMY WITH MYOSURE (N/A) ?  ?SURGEON:  Surgeon(s) and Role: ?   * Rowland Lathe, MD - Primary ?  ?ANESTHESIA:   local and general ?  ?EBL:  5 mL  ?  ?BLOOD ADMINISTERED:none ?  ?DRAINS: none  ?  ?LOCAL MEDICATIONS USED:  LIDOCAINE  and Amount: 10 ml ?  ?SPECIMEN:  Source of Specimen:  endometrial curettings ?  ?DISPOSITION OF SPECIMEN:  PATHOLOGY ?  ?COUNTS:  YES ?  ?PLAN OF CARE: Discharge to home after PACU ?  ?PATIENT DISPOSITION:  PACU - hemodynamically stable. ? ?COMPLICATIONS: none ? ?FINDINGS: anteverted uterus sounded to 8cm. On hysterscopic view, bilateral ostia visualized. Endometrial polyp noted with stalk at mid left posterior uterine wall. Otherwise thin white appearing endometrium. Total fluid deficit 176m. ? ?PROCEDURE IN DETAIL: ? ?The patient was appropriately consented and taken to the operating room where anesthesia was administered without difficulty. Thromboguards were placed and connected. She was placed in the dorsal lithotomy position in stirrups. She was examined under anesthesia and found to have an anteverted uterus. The patient was then prepped and draped in normal sterile fashion. A long speculum was inserted into the vagina. A single-tooth tenaculum was used to grasp the anterior lip of the cervix. A paracervical block was obtained by injecting a total of 13mof 1% lidocaine at the 4 and 8 o'clock positions at the cervicovaginal junction. The uterus was sounded to 8cm. The cervical os was sequentially dilated using Pratt dilators to accommodate the hysteroscope. The hysteroscope was introduced under direct visualization and the uterus was distended with normal saline.  Bilateral ostia were visualized. Findings as noted above. The Myosure Lite device was used to resect the polyp and obtain a global endometrial sampling. Specimen was collected for pathology. Hemostasis was noted in the uterine cavity. The hysteroscope was removed. A sharp curettage was performed until a gritty feel was noted. Endometrial curettings were collected on Telfa and sent for pathology. The tenaculum was removed from the cervix and good hemostasis was noted at the puncture sites. The patient tolerated the procedure well. The instrument and sponge counts were correct times two. The patient was awakened from anesthesia and taken to the recovery room in stable condition. ? ?MiIrene PapMD 09/15/21 12:15 PM w ? ? ?  ?

## 2021-09-15 NOTE — Anesthesia Postprocedure Evaluation (Signed)
Anesthesia Post Note ? ?Patient: Debbie Berry ? ?Procedure(s) Performed: DILATATION & CURETTAGE/HYSTEROSCOPY (Uterus) ?POLYPECTOMY WITH MYOSURE (Uterus) ? ?  ? ?Patient location during evaluation: PACU ?Anesthesia Type: General ?Level of consciousness: awake and alert ?Pain management: pain level controlled ?Vital Signs Assessment: post-procedure vital signs reviewed and stable ?Respiratory status: spontaneous breathing, nonlabored ventilation and respiratory function stable ?Cardiovascular status: stable and blood pressure returned to baseline ?Anesthetic complications: no ? ? ?No notable events documented. ? ?Last Vitals:  ?Vitals:  ? 09/15/21 1320 09/15/21 1330  ?BP: 120/87 129/80  ?Pulse: 68 64  ?Resp: 14 (!) 23  ?Temp:  36.7 ?C  ?SpO2: 100% 100%  ?  ?Last Pain:  ?Vitals:  ? 09/15/21 1330  ?TempSrc:   ?PainSc: 4   ? ? ?  ?  ?  ?  ?  ?  ? ?Audry Pili ? ? ? ? ?

## 2021-09-15 NOTE — Discharge Instructions (Signed)

## 2021-09-15 NOTE — Transfer of Care (Signed)
Immediate Anesthesia Transfer of Care Note ? ?Patient: Debbie Berry ? ?Procedure(s) Performed: DILATATION & CURETTAGE/HYSTEROSCOPY (Uterus) ?POLYPECTOMY WITH MYOSURE (Uterus) ? ?Patient Location: PACU ? ?Anesthesia Type:General ? ?Level of Consciousness: drowsy, patient cooperative and responds to stimulation ? ?Airway & Oxygen Therapy: Patient Spontanous Breathing and Patient connected to face mask oxygen ? ?Post-op Assessment: Report given to RN and Post -op Vital signs reviewed and stable ? ?Post vital signs: Reviewed and stable ? ?Last Vitals:  ?Vitals Value Taken Time  ?BP 116/77 09/15/21 1217  ?Temp    ?Pulse 58 09/15/21 1218  ?Resp 14 09/15/21 1218  ?SpO2 100 % 09/15/21 1218  ?Vitals shown include unvalidated device data. ? ?Last Pain:  ?Vitals:  ? 09/15/21 1042  ?TempSrc: Oral  ?PainSc: 0-No pain  ?   ? ?Patients Stated Pain Goal: 6 (09/15/21 1042) ? ?Complications: No notable events documented. ?

## 2021-09-16 ENCOUNTER — Encounter (HOSPITAL_BASED_OUTPATIENT_CLINIC_OR_DEPARTMENT_OTHER): Payer: Self-pay | Admitting: Obstetrics and Gynecology

## 2021-09-20 ENCOUNTER — Encounter: Payer: Self-pay | Admitting: Adult Health

## 2021-09-23 ENCOUNTER — Telehealth: Payer: Self-pay

## 2021-09-23 NOTE — Telephone Encounter (Signed)
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  Pt verbalized understanding and thanks  ?

## 2021-10-01 ENCOUNTER — Telehealth: Payer: Self-pay | Admitting: Hematology and Oncology

## 2021-10-01 NOTE — Telephone Encounter (Signed)
Per provider reschedule called and spoke to pt about appointment.  Pt confirmed appointment  ?

## 2021-11-08 ENCOUNTER — Other Ambulatory Visit: Payer: Self-pay | Admitting: *Deleted

## 2021-11-08 DIAGNOSIS — C50211 Malignant neoplasm of upper-inner quadrant of right female breast: Secondary | ICD-10-CM

## 2021-12-13 ENCOUNTER — Ambulatory Visit
Admission: RE | Admit: 2021-12-13 | Discharge: 2021-12-13 | Disposition: A | Discharge status: Home / Self Care | Payer: BC Managed Care – PPO | Source: Ambulatory Visit | Attending: Hematology and Oncology | Admitting: Hematology and Oncology

## 2021-12-13 DIAGNOSIS — Z170 Estrogen receptor positive status [ER+]: Secondary | ICD-10-CM

## 2022-01-13 ENCOUNTER — Other Ambulatory Visit: Payer: Self-pay

## 2022-01-13 ENCOUNTER — Encounter (HOSPITAL_BASED_OUTPATIENT_CLINIC_OR_DEPARTMENT_OTHER): Payer: Self-pay | Admitting: Obstetrics and Gynecology

## 2022-01-13 NOTE — Progress Notes (Signed)
Your procedure is scheduled on Wednesday, 01/26/2022.  Report to [facility].   Call this number if you have problems the morning of surgery  :[phone].   OUR ADDRESS IS [place].  WE ARE LOCATED IN [facility].  PLEASE BRING YOUR INSURANCE CARD AND PHOTO ID DAY OF SURGERY.  ONLY 2 PEOPLE ARE ALLOWED IN  WAITING  ROOM.                                      REMEMBER:  DO NOT EAT FOOD, CANDY GUM OR MINTS  AFTER MIDNIGHT THE NIGHT BEFORE YOUR SURGERY . YOU MAY HAVE CLEAR LIQUIDS FROM MIDNIGHT THE NIGHT BEFORE YOUR SURGERY UNTIL  10:30 AM. NO CLEAR LIQUIDS AFTER   10:30 AM DAY OF SURGERY.  YOU MAY  BRUSH YOUR TEETH MORNING OF SURGERY AND RINSE YOUR MOUTH OUT, NO CHEWING GUM CANDY OR MINTS.     CLEAR LIQUID DIET   Foods Allowed                                                                     Foods Excluded  Coffee and tea, regular and decaf                             liquids that you cannot  Plain Jell-O                                                                   see through such as: Fruit ices (not with fruit pulp)                                     milk, soups, orange juice  Plain  Popsicles                                    All solid food Carbonated beverages, regular and diet                                    Cranberry, grape and apple juices Sports drinks like Gatorade _____________________________________________________________________     TAKE THESE MEDICATIONS MORNING OF SURGERY: NONE    UP TO 4 VISITORS  MAY VISIT IN THE EXTENDED RECOVERY ROOM UNTIL 800 PM ONLY.  ONE  VISITOR AGE 53 AND OVER MAY SPEND THE NIGHT AND MUST BE IN EXTENDED RECOVERY ROOM NO LATER THAN 800 PM . YOUR DISCHARGE TIME AFTER YOU SPEND THE NIGHT IS 900 AM THE MORNING AFTER YOUR SURGERY.  YOU MAY PACK A SMALL OVERNIGHT BAG WITH TOILETRIES FOR YOUR OVERNIGHT STAY IF  YOU WISH.  YOUR PRESCRIPTION MEDICATIONS WILL BE PROVIDED  DURING [place].                                      DO NOT WEAR JEWERLY, MAKE UP. DO NOT WEAR LOTIONS, POWDERS, PERFUMES OR NAIL POLISH ON YOUR FINGERNAILS. TOENAIL POLISH IS OK TO WEAR. DO NOT SHAVE FOR 48 HOURS PRIOR TO DAY OF SURGERY. MEN MAY SHAVE FACE AND NECK. CONTACTS, GLASSES, OR DENTURES MAY NOT BE WORN TO SURGERY.  REMEMBER: NO SMOKING, DRUGS OR ALCOHOL FOR 24 HOURS BEFORE YOUR SURGERY.                                    [facility] IS NOT RESPONSIBLE  FOR ANY BELONGINGS.                                                                    Marland Kitchen           Ord - Preparing for Surgery Before surgery, you can play an important role.  Because skin is not sterile, your skin needs to be as free of germs as possible.  You can reduce the number of germs on your skin by washing with CHG (chlorahexidine gluconate) soap before surgery.  CHG is an antiseptic cleaner which kills germs and bonds with the skin to continue killing germs even after washing. Please DO NOT use if you have an allergy to CHG or antibacterial soaps.  If your skin becomes reddened/irritated stop using the CHG and inform your nurse when you arrive at Short Stay. Do not shave (including legs and underarms) for at least 48 hours prior to the first CHG shower.  You may shave your face/neck. Please follow these instructions carefully:  1.  Shower with CHG Soap the night before surgery and the  morning of Surgery.  2.  If you choose to wash your hair, wash your hair first as usual with your  normal  shampoo.  3.  After you shampoo, rinse your hair and body thoroughly to remove the  shampoo.                            4.  Use CHG as you would any other liquid soap.  You can apply chg directly  to the skin and wash , please wash your belly button thoroughly with chg soap provided night before and morning of your surgery.                     Gently with a scrungie or clean washcloth.  5.  Apply the CHG Soap to your  body ONLY FROM THE NECK DOWN.   Do not use on face/ open                           Wound or open sores. Avoid contact with eyes, ears mouth and genitals (private parts).  Wash face,  Genitals (private parts) with your normal soap.             6.  Wash thoroughly, paying special attention to the area where your surgery  will be performed.  7.  Thoroughly rinse your body with warm water from the neck down.  8.  DO NOT shower/wash with your normal soap after using and rinsing off  the CHG Soap.                9.  Pat yourself dry with a clean towel.            10.  Wear clean pajamas.            11.  Place clean sheets on your bed the night of your first shower and do not  sleep with pets. Day of Surgery : Do not apply any lotions/deodorants the morning of surgery.  Please wear clean clothes to the hospital/surgery center.  IF YOU HAVE ANY SKIN IRRITATION OR PROBLEMS WITH THE SURGICAL SOAP, PLEASE GET A BAR OF GOLD DIAL SOAP AND SHOWER THE NIGHT BEFORE YOUR SURGERY AND THE MORNING OF YOUR SURGERY. PLEASE LET THE NURSE KNOW MORNING OF YOUR SURGERY IF YOU HAD ANY PROBLEMS WITH THE SURGICAL SOAP.   ________________________________________________________________________                                                        QUESTIONS [facility] PRE OP NURSE PHONE [phone].

## 2022-01-13 NOTE — Progress Notes (Signed)
Spoke w/ via phone for pre-op interview---[facility] needs dos----urine pregnancy               Lab results------01/24/22 lab appt for cbc w/diff / platelets, type & screen COVID test -----patient states asymptomatic no test needed Arrive at -------1130 on Wednesday, 01/26/22 NPO after MN NO Solid Food.  Clear liquids from MN until---1030 Med rec completed Medications to take morning of surgery -----none Diabetic medication -----n/a Patient instructed no nail polish to be worn day of surgery Patient instructed to bring photo id and insurance card day of surgery Patient aware to have Driver (ride ) / caregiver    for 24 hours after surgery - husband, Merry Proud Patient Special Instructions -----Extended / overnight stay instructions given. Pre-Op special Istructions -----none Patient verbalized understanding of instructions that were given at this phone interview. Patient denies shortness of breath, chest pain, fever, cough at this phone interview.

## 2022-01-24 ENCOUNTER — Encounter (HOSPITAL_COMMUNITY)
Admission: RE | Admit: 2022-01-24 | Discharge: 2022-01-24 | Disposition: A | Discharge status: Home / Self Care | Payer: BC Managed Care – PPO | Source: Ambulatory Visit | Attending: Obstetrics and Gynecology | Admitting: Obstetrics and Gynecology

## 2022-01-24 ENCOUNTER — Other Ambulatory Visit (HOSPITAL_COMMUNITY): Payer: BC Managed Care – PPO

## 2022-01-24 DIAGNOSIS — Z01812 Encounter for preprocedural laboratory examination: Secondary | ICD-10-CM | POA: Diagnosis present

## 2022-01-24 DIAGNOSIS — Z01818 Encounter for other preprocedural examination: Secondary | ICD-10-CM

## 2022-01-24 LAB — BASIC METABOLIC PANEL
Anion gap: 6 (ref 5–15)
BUN: 17 mg/dL (ref 6–20)
CO2: 25 mmol/L (ref 22–32)
Calcium: 9.5 mg/dL (ref 8.9–10.3)
Chloride: 108 mmol/L (ref 98–111)
Creatinine, Ser: 0.68 mg/dL (ref 0.44–1.00)
GFR, Estimated: >60 mL/min (ref >60)
Glucose, Bld: 85 mg/dL (ref 70–99)
Potassium: 4.3 mmol/L (ref 3.5–5.1)
Sodium: 139 mmol/L (ref 135–145)

## 2022-01-24 LAB — CBC WITH DIFFERENTIAL/PLATELET
Abs Immature Granulocytes: 0.01 10*3/uL (ref 0.00–0.07)
Basophils Absolute: 0 10*3/uL (ref 0.0–0.1)
Basophils Relative: 1 %
Eosinophils Absolute: 0.1 10*3/uL (ref 0.0–0.5)
Eosinophils Relative: 2 %
HCT: 43.3 % (ref 36.0–46.0)
Hemoglobin: 14.6 g/dL (ref 12.0–15.0)
Immature Granulocytes: 0 %
Lymphocytes Relative: 31 %
Lymphs Abs: 1.5 10*3/uL (ref 0.7–4.0)
MCH: 29.5 pg (ref 26.0–34.0)
MCHC: 33.7 g/dL (ref 30.0–36.0)
MCV: 87.5 fL (ref 80.0–100.0)
Monocytes Absolute: 0.4 10*3/uL (ref 0.1–1.0)
Monocytes Relative: 9 %
Neutro Abs: 2.8 10*3/uL (ref 1.7–7.7)
Neutrophils Relative %: 57 %
Platelets: 225 10*3/uL (ref 150–400)
RBC: 4.95 MIL/uL (ref 3.87–5.11)
RDW: 12.3 % (ref 11.5–15.5)
WBC: 4.9 10*3/uL (ref 4.0–10.5)
nRBC: 0 % (ref 0.0–0.2)

## 2022-01-25 NOTE — H&P (Signed)
Debbie Berry is an 51 y.o. female P2 with simple endometrial hyperplasia without atypia.  3/29 hysteroscopic polypectomy for recurrent PMB, path shows simple hyperplasia without atypia management of simple hyperplasia is typically with progestin therapy with alternative of hysterectomy. Since she has a history of ER/PR+ breast cancer, oncologist does not recommend progestin therapy. Patient desires hysterectomy with BSO.   03/04/21 [place]:  FINDINGS: Uterus   Measurements: 8.9 x 3.9 x 4.8 cm = volume: 87.1 mL. No fibroids or other mass visualized.   Endometrium   Thickness: 4.9 mm.  No focal abnormality visualized.   Right ovary   Measurements: 1.6 x 1 x 1.4 cm = volume: 1.2 mL. Normal appearance/no adnexal mass.   Left ovary   Measurements: 3 x 1.5 x 1.7 cm = volume: 3.9 mL. Normal appearance/no adnexal mass.  Pertinent Gynecological History: P2  Menstrual History: Patient's last menstrual period was 12/19/2018 (exact date).    Past Medical History:  Diagnosis Date   Complication of anesthesia    Patient stated that after her surgery at [facility] in March 2023, she experienced nausae.   Family history of cervical cancer    History of COVID-19 04/2021   per pt mild symptoms that resolved   History of external beam radiation therapy    02-15-2019  to 03-15-2019  right breast cancer   Hyperlipidemia    Malignant neoplasm of upper-inner quadrant of right breast in female, estrogen receptor positive ([place]) 11/2018   oncologist--- dr Lindi Adie;  dx 06/ 2020,  IDC , Stage IA, ER/ PR +;  completed radiation 03-15-2019   Migraine    PMB (postmenopausal bleeding)    PONV (postoperative nausea and vomiting)    Vertigo    Wears contact lenses     Past Surgical History:  Procedure Laterality Date   BREAST BIOPSY Right 11/2018   BREAST LUMPECTOMY WITH RADIOACTIVE SEED AND SENTINEL LYMPH NODE BIOPSY Right 01/17/2019   Procedure: RIGHT BREAST LUMPECTOMY WITH RADIOACTIVE SEED AND SENTINEL  LYMPH NODE BIOPSY;  Surgeon: Jovita Kussmaul, MD;  Location: [place];  Service: General;  Laterality: Right;   CHOLECYSTECTOMY, LAPAROSCOPIC  2013   COLONOSCOPY  02/2020   DILATATION & CURETTAGE/HYSTEROSCOPY WITH MYOSURE N/A 09/15/2021   Procedure: DILATATION & CURETTAGE/HYSTEROSCOPY;  Surgeon: Rowland Lathe, MD;  Location: [place];  Service: Gynecology;  Laterality: N/A;   INCISIONAL HERNIA REPAIR  2015   POLYPECTOMY N/A 09/15/2021   Procedure: POLYPECTOMY WITH MYOSURE;  Surgeon: Rowland Lathe, MD;  Location: [place];  Service: Gynecology;  Laterality: N/A;    Family History  Problem Relation Age of Onset   Colon polyps Mother    Cervical cancer Paternal Grandmother    Cervical cancer Paternal Aunt 38   Colon cancer Neg Hx    Esophageal cancer Neg Hx    Rectal cancer Neg Hx    Stomach cancer Neg Hx     Social History:  reports that she quit smoking about 33 years ago. Her smoking use included cigarettes. She has never used smokeless tobacco. She reports current alcohol use. She reports that she does not use drugs.  Allergies:  Allergies  Allergen Reactions   Azithromycin Anaphylaxis   Penicillins Shortness Of Breath   Nsaids Swelling    Swelling to lips and eyes (PER PT ALL NSAIDS)    No medications prior to admission.    Review of Systems  Constitutional:  Negative for fever.  HENT:  Negative for sore  throat.   Respiratory:  Negative for shortness of breath.   Cardiovascular:  Negative for chest pain.  Gastrointestinal:  Negative for abdominal pain.  Genitourinary:  Negative for vaginal bleeding.  Musculoskeletal:  Negative for neck pain.  Skin:  Negative for rash.  Neurological:  Negative for headaches.  Psychiatric/Behavioral:  Negative for suicidal ideas.     Height '5\' 7"'$  (1.702 m), weight 66.7 kg, last menstrual period 12/19/2018. Physical Exam  Chaperone Chaperone:  present  Constitutional General Appearance: healthy-appearing, well-nourished, well-developed  Psychiatric Orientation: to time, to place, to person Mood and Affect: active and alert, normal mood, normal affect  Abdomen Auscultation/Inspection/Palpation: normal bowel sounds, soft, non-distended, no tenderness, no hepatomegaly, no splenomegaly, no masses, no CVA tenderness Hernia: none palpated  Female Genitalia Vulva: no masses, no atrophy, no lesions Bladder/Urethra: normal meatus, no urethral discharge, no urethral mass, bladder non distended Vagina no tenderness, no erythema, no abnormal vaginal discharge, no vesicle(s) or ulcers, no cystocele, no rectocele Cervix: grossly normal, no discharge, no cervical motion tenderness Uterus: normal size, normal shape, midline, mobile, non-tender, no uterine prolapse Adnexa/Parametria: no parametrial tenderness, no parametrial mass, no adnexal tenderness, no ovarian mass Results for orders placed or performed during the hospital encounter of 01/24/22 (from the past 24 hour(s))  Type and screen [facility]     Status: None   Collection Time: 01/24/22  2:18 PM  Result Value Ref Range   ABO/RH(D) A POS    Antibody Screen NEG    Sample Expiration 02/07/2022,2359    Extend sample reason      NO TRANSFUSIONS OR PREGNANCY IN THE PAST 3 MONTHS Performed at [facility], [address] [place], [place] [address]   CBC with Differential/Platelet     Status: None   Collection Time: 01/24/22  2:18 PM  Result Value Ref Range   WBC 4.9 4.0 - 10.5 K/uL   RBC 4.95 3.87 - 5.11 MIL/uL   Hemoglobin 14.6 12.0 - 15.0 g/dL   HCT 43.3 36.0 - 46.0 %   MCV 87.5 80.0 - 100.0 fL   MCH 29.5 26.0 - 34.0 pg   MCHC 33.7 30.0 - 36.0 g/dL   RDW 12.3 11.5 - 15.5 %   Platelets 225 150 - 400 K/uL   nRBC 0.0 0.0 - 0.2 %   Neutrophils Relative % 57 %   Neutro Abs 2.8 1.7 - 7.7 K/uL   Lymphocytes Relative 31 %   Lymphs Abs 1.5 0.7  - 4.0 K/uL   Monocytes Relative 9 %   Monocytes Absolute 0.4 0.1 - 1.0 K/uL   Eosinophils Relative 2 %   Eosinophils Absolute 0.1 0.0 - 0.5 K/uL   Basophils Relative 1 %   Basophils Absolute 0.0 0.0 - 0.1 K/uL   Immature Granulocytes 0 %   Abs Immature Granulocytes 0.01 0.00 - 0.07 K/uL  Basic metabolic panel per protocol     Status: None   Collection Time: 01/24/22  2:18 PM  Result Value Ref Range   Sodium 139 135 - 145 mmol/L   Potassium 4.3 3.5 - 5.1 mmol/L   Chloride 108 98 - 111 mmol/L   CO2 25 22 - 32 mmol/L   Glucose, Bld 85 70 - 99 mg/dL   BUN 17 6 - 20 mg/dL   Creatinine, Ser 0.68 0.44 - 1.00 mg/dL   Calcium 9.5 8.9 - 10.3 mg/dL   GFR, Estimated >60 >60 mL/min   Anion gap 6 5 - 15  No results found.  Assessment/Plan: 50Y P2 with simple endometrial hyperplasia without atypia, not candidate for progestin therapy due to ER/PR+ breast cancer, desires definitive management.  - Plan: TLH, BSO, cysto - History of umbilical hernia with mesh so planning [place] entry - Informed consent obtained. Reviewed risk of infection, bleeding, damage  to surrounding organs, laparotomy, blood transfusion, failure to achieve desired results.  - NSAID allergy so planning tylenol/oxycodone postop. Oxycodone rx sent from office   Rowland Lathe 01/25/2022, 1:40 PM

## 2022-01-26 ENCOUNTER — Encounter (HOSPITAL_BASED_OUTPATIENT_CLINIC_OR_DEPARTMENT_OTHER): Payer: Self-pay | Admitting: Obstetrics and Gynecology

## 2022-01-26 ENCOUNTER — Ambulatory Visit (HOSPITAL_BASED_OUTPATIENT_CLINIC_OR_DEPARTMENT_OTHER)
Admission: RE | Admit: 2022-01-26 | Discharge: 2022-01-26 | Disposition: A | Discharge status: Home / Self Care | Payer: BC Managed Care – PPO | Attending: Obstetrics and Gynecology | Admitting: Obstetrics and Gynecology

## 2022-01-26 ENCOUNTER — Other Ambulatory Visit: Payer: Self-pay

## 2022-01-26 ENCOUNTER — Encounter (HOSPITAL_BASED_OUTPATIENT_CLINIC_OR_DEPARTMENT_OTHER): Payer: Self-pay

## 2022-01-26 ENCOUNTER — Ambulatory Visit (HOSPITAL_BASED_OUTPATIENT_CLINIC_OR_DEPARTMENT_OTHER): Payer: BC Managed Care – PPO | Admitting: Anesthesiology

## 2022-01-26 ENCOUNTER — Encounter (HOSPITAL_BASED_OUTPATIENT_CLINIC_OR_DEPARTMENT_OTHER)
Admission: RE | Disposition: A | Discharge status: Home / Self Care | Payer: Self-pay | Source: Home / Self Care | Attending: Obstetrics and Gynecology

## 2022-01-26 DIAGNOSIS — N8003 Adenomyosis of the uterus: Secondary | ICD-10-CM | POA: Diagnosis not present

## 2022-01-26 DIAGNOSIS — D3912 Neoplasm of uncertain behavior of left ovary: Secondary | ICD-10-CM | POA: Diagnosis not present

## 2022-01-26 DIAGNOSIS — Z853 Personal history of malignant neoplasm of breast: Secondary | ICD-10-CM | POA: Diagnosis not present

## 2022-01-26 DIAGNOSIS — N259 Disorder resulting from impaired renal tubular function, unspecified: Secondary | ICD-10-CM | POA: Diagnosis not present

## 2022-01-26 DIAGNOSIS — N8501 Benign endometrial hyperplasia: Secondary | ICD-10-CM | POA: Diagnosis present

## 2022-01-26 DIAGNOSIS — Z87891 Personal history of nicotine dependence: Secondary | ICD-10-CM | POA: Insufficient documentation

## 2022-01-26 DIAGNOSIS — Z01818 Encounter for other preprocedural examination: Secondary | ICD-10-CM

## 2022-01-26 DIAGNOSIS — N888 Other specified noninflammatory disorders of cervix uteri: Secondary | ICD-10-CM | POA: Insufficient documentation

## 2022-01-26 DIAGNOSIS — Z90710 Acquired absence of both cervix and uterus: Secondary | ICD-10-CM | POA: Diagnosis present

## 2022-01-26 HISTORY — PX: CYSTOSCOPY: SHX5120

## 2022-01-26 HISTORY — DX: Other complications of anesthesia, initial encounter: T88.59XA

## 2022-01-26 HISTORY — PX: TOTAL LAPAROSCOPIC HYSTERECTOMY WITH SALPINGECTOMY: SHX6742

## 2022-01-26 HISTORY — DX: Other specified postprocedural states: Z98.890

## 2022-01-26 LAB — TYPE AND SCREEN
ABO/RH(D): A POS
Antibody Screen: NEGATIVE

## 2022-01-26 LAB — POCT PREGNANCY, URINE: Preg Test, Ur: NEGATIVE

## 2022-01-26 SURGERY — HYSTERECTOMY, TOTAL, LAPAROSCOPIC, WITH SALPINGECTOMY
Anesthesia: General

## 2022-01-26 SURGERY — HYSTERECTOMY, TOTAL, LAPAROSCOPIC, WITH SALPINGECTOMY
Anesthesia: General | Site: Bladder

## 2022-01-26 MED ORDER — SODIUM CHLORIDE (PF) 0.9 % IJ SOLN
INTRAMUSCULAR | Status: DC | PRN
  Administered 2022-01-26: 60 mL via SURGICAL_CAVITY

## 2022-01-26 MED ORDER — MIDAZOLAM HCL 2 MG/2ML IJ SOLN
INTRAMUSCULAR | Status: AC
  Filled 2022-01-26: qty 2

## 2022-01-26 MED ORDER — SODIUM CHLORIDE 0.9 % IR SOLN
Status: DC | PRN
  Administered 2022-01-26: 300 mL

## 2022-01-26 MED ORDER — GLYCOPYRROLATE PF 0.2 MG/ML IJ SOSY
PREFILLED_SYRINGE | INTRAMUSCULAR | Status: AC
  Filled 2022-01-26: qty 1

## 2022-01-26 MED ORDER — GENTAMICIN SULFATE 40 MG/ML IJ SOLN
5.0000 mg/kg | INTRAVENOUS | Status: AC
  Administered 2022-01-26: 330 mg via INTRAVENOUS
  Filled 2022-01-26: qty 8.25

## 2022-01-26 MED ORDER — SUGAMMADEX SODIUM 200 MG/2ML IV SOLN
INTRAVENOUS | Status: DC | PRN
  Administered 2022-01-26: 150 mg via INTRAVENOUS

## 2022-01-26 MED ORDER — SIMETHICONE 80 MG PO CHEW: 80.0000 mg | CHEWABLE_TABLET | Freq: Four times a day (QID) | ORAL | Status: DC | PRN

## 2022-01-26 MED ORDER — FENTANYL CITRATE (PF) 100 MCG/2ML IJ SOLN
INTRAMUSCULAR | Status: DC | PRN
Start: 2022-01-26 — End: 2022-01-26
  Administered 2022-01-26 (×2): 25 ug via INTRAVENOUS
  Administered 2022-01-26 (×2): 50 ug via INTRAVENOUS

## 2022-01-26 MED ORDER — DEXAMETHASONE SODIUM PHOSPHATE 10 MG/ML IJ SOLN
INTRAMUSCULAR | Status: DC | PRN
  Administered 2022-01-26: 10 mg via INTRAVENOUS

## 2022-01-26 MED ORDER — OXYCODONE HCL 5 MG/5ML PO SOLN: 5.0000 mg | Freq: Once | ORAL | Status: DC | PRN

## 2022-01-26 MED ORDER — MENTHOL 3 MG MT LOZG: 1.0000 | LOZENGE | OROMUCOSAL | Status: DC | PRN

## 2022-01-26 MED ORDER — FLUORESCEIN SODIUM 10 % IV SOLN
INTRAVENOUS | Status: DC | PRN
  Administered 2022-01-26: 1 mL via INTRAVENOUS

## 2022-01-26 MED ORDER — SIMETHICONE 80 MG PO CHEW: 80.0000 mg | CHEWABLE_TABLET | Freq: Four times a day (QID) | ORAL | 0 refills | Status: DC | PRN

## 2022-01-26 MED ORDER — BUPIVACAINE HCL (PF) 0.25 % IJ SOLN
INTRAMUSCULAR | Status: DC | PRN
  Administered 2022-01-26: 10 mL

## 2022-01-26 MED ORDER — ROCURONIUM BROMIDE 10 MG/ML (PF) SYRINGE
PREFILLED_SYRINGE | INTRAVENOUS | Status: AC
  Filled 2022-01-26: qty 10

## 2022-01-26 MED ORDER — KETAMINE HCL 10 MG/ML IJ SOLN
INTRAMUSCULAR | Status: DC | PRN
  Administered 2022-01-26: 5 mg via INTRAVENOUS
  Administered 2022-01-26: 30 mg via INTRAVENOUS

## 2022-01-26 MED ORDER — 0.9 % SODIUM CHLORIDE (POUR BTL) OPTIME
TOPICAL | Status: DC | PRN
  Administered 2022-01-26: 500 mL

## 2022-01-26 MED ORDER — OXYCODONE HCL 5 MG PO TABS
5.0000 mg | ORAL_TABLET | ORAL | Status: DC | PRN
  Administered 2022-01-26: 5 mg via ORAL

## 2022-01-26 MED ORDER — ONDANSETRON HCL 4 MG/2ML IJ SOLN
INTRAMUSCULAR | Status: DC | PRN
  Administered 2022-01-26: 4 mg via INTRAVENOUS

## 2022-01-26 MED ORDER — MEPERIDINE HCL 25 MG/ML IJ SOLN: 6.2500 mg | INTRAMUSCULAR | Status: DC | PRN

## 2022-01-26 MED ORDER — AMISULPRIDE (ANTIEMETIC) 5 MG/2ML IV SOLN
INTRAVENOUS | Status: AC
  Filled 2022-01-26: qty 4

## 2022-01-26 MED ORDER — POLYETHYLENE GLYCOL 3350 17 G PO PACK: 17.0000 g | PACK | Freq: Every day | ORAL | Status: DC | PRN

## 2022-01-26 MED ORDER — ACETAMINOPHEN 500 MG PO TABS
1000.0000 mg | ORAL_TABLET | Freq: Four times a day (QID) | ORAL | Status: DC
  Administered 2022-01-26: 1000 mg via ORAL

## 2022-01-26 MED ORDER — PROMETHAZINE HCL 25 MG/ML IJ SOLN
6.2500 mg | INTRAMUSCULAR | Status: AC | PRN
  Administered 2022-01-26 (×2): 6.25 mg via INTRAVENOUS

## 2022-01-26 MED ORDER — DIPHENHYDRAMINE HCL 50 MG/ML IJ SOLN
INTRAMUSCULAR | Status: AC
  Filled 2022-01-26: qty 1

## 2022-01-26 MED ORDER — LIDOCAINE 2% (20 MG/ML) 5 ML SYRINGE
INTRAMUSCULAR | Status: DC | PRN
  Administered 2022-01-26: 60 mg via INTRAVENOUS

## 2022-01-26 MED ORDER — LACTATED RINGERS IV SOLN: INTRAVENOUS | Status: DC

## 2022-01-26 MED ORDER — SCOPOLAMINE 1 MG/3DAYS TD PT72
MEDICATED_PATCH | TRANSDERMAL | Status: DC | PRN
  Administered 2022-01-26: 1 via TRANSDERMAL

## 2022-01-26 MED ORDER — ROCURONIUM BROMIDE 10 MG/ML (PF) SYRINGE
PREFILLED_SYRINGE | INTRAVENOUS | Status: DC | PRN
  Administered 2022-01-26: 70 mg via INTRAVENOUS

## 2022-01-26 MED ORDER — KETAMINE HCL 50 MG/5ML IJ SOSY
PREFILLED_SYRINGE | INTRAMUSCULAR | Status: AC
  Filled 2022-01-26: qty 5

## 2022-01-26 MED ORDER — HYDROMORPHONE HCL 1 MG/ML IJ SOLN
INTRAMUSCULAR | Status: AC
  Filled 2022-01-26: qty 1

## 2022-01-26 MED ORDER — ACETAMINOPHEN 500 MG PO TABS
ORAL_TABLET | ORAL | Status: AC
  Filled 2022-01-26: qty 2

## 2022-01-26 MED ORDER — PHENYLEPHRINE 80 MCG/ML (10ML) SYRINGE FOR IV PUSH (FOR BLOOD PRESSURE SUPPORT)
PREFILLED_SYRINGE | INTRAVENOUS | Status: AC
  Filled 2022-01-26: qty 10

## 2022-01-26 MED ORDER — PHENYLEPHRINE 80 MCG/ML (10ML) SYRINGE FOR IV PUSH (FOR BLOOD PRESSURE SUPPORT)
PREFILLED_SYRINGE | INTRAVENOUS | Status: DC | PRN
  Administered 2022-01-26 (×3): 80 ug via INTRAVENOUS

## 2022-01-26 MED ORDER — EPHEDRINE 5 MG/ML INJ
INTRAVENOUS | Status: AC
  Filled 2022-01-26: qty 5

## 2022-01-26 MED ORDER — SODIUM CHLORIDE 0.9 % IR SOLN
Status: DC | PRN
  Administered 2022-01-26: 200 mL

## 2022-01-26 MED ORDER — ONDANSETRON HCL 4 MG/2ML IJ SOLN
INTRAMUSCULAR | Status: AC
  Filled 2022-01-26: qty 2

## 2022-01-26 MED ORDER — DOCUSATE SODIUM 100 MG PO CAPS
ORAL_CAPSULE | ORAL | Status: AC
  Filled 2022-01-26: qty 1

## 2022-01-26 MED ORDER — SCOPOLAMINE 1 MG/3DAYS TD PT72
MEDICATED_PATCH | TRANSDERMAL | Status: AC
  Filled 2022-01-26: qty 1

## 2022-01-26 MED ORDER — LIDOCAINE HCL (PF) 2 % IJ SOLN
INTRAMUSCULAR | Status: AC
  Filled 2022-01-26: qty 5

## 2022-01-26 MED ORDER — DEXAMETHASONE SODIUM PHOSPHATE 10 MG/ML IJ SOLN
INTRAMUSCULAR | Status: AC
  Filled 2022-01-26: qty 1

## 2022-01-26 MED ORDER — DIPHENHYDRAMINE HCL 50 MG/ML IJ SOLN
INTRAMUSCULAR | Status: DC | PRN
  Administered 2022-01-26: 12.5 mg via INTRAVENOUS

## 2022-01-26 MED ORDER — OXYCODONE HCL 5 MG PO TABS
ORAL_TABLET | ORAL | Status: AC
  Filled 2022-01-26: qty 1

## 2022-01-26 MED ORDER — OXYCODONE HCL 5 MG PO TABS: 5.0000 mg | ORAL_TABLET | Freq: Once | ORAL | Status: DC | PRN

## 2022-01-26 MED ORDER — METRONIDAZOLE 500 MG/100ML IV SOLN
500.0000 mg | INTRAVENOUS | Status: AC
  Administered 2022-01-26: 500 mg via INTRAVENOUS

## 2022-01-26 MED ORDER — PROMETHAZINE HCL 25 MG/ML IJ SOLN
INTRAMUSCULAR | Status: AC
  Filled 2022-01-26: qty 1

## 2022-01-26 MED ORDER — ACETAMINOPHEN 500 MG PO TABS
1000.0000 mg | ORAL_TABLET | ORAL | Status: AC
  Administered 2022-01-26: 1000 mg via ORAL

## 2022-01-26 MED ORDER — PROPOFOL 10 MG/ML IV BOLUS
INTRAVENOUS | Status: AC
  Filled 2022-01-26: qty 20

## 2022-01-26 MED ORDER — DOCUSATE SODIUM 100 MG PO CAPS
100.0000 mg | ORAL_CAPSULE | Freq: Two times a day (BID) | ORAL | Status: DC
  Administered 2022-01-26: 100 mg via ORAL

## 2022-01-26 MED ORDER — ONDANSETRON HCL 4 MG PO TABS: 4.0000 mg | ORAL_TABLET | Freq: Four times a day (QID) | ORAL | Status: DC | PRN

## 2022-01-26 MED ORDER — METRONIDAZOLE 500 MG/100ML IV SOLN
INTRAVENOUS | Status: AC
  Filled 2022-01-26: qty 100

## 2022-01-26 MED ORDER — PROPOFOL 10 MG/ML IV BOLUS
INTRAVENOUS | Status: DC | PRN
  Administered 2022-01-26: 30 mg via INTRAVENOUS
  Administered 2022-01-26: 130 mg via INTRAVENOUS

## 2022-01-26 MED ORDER — HYDROMORPHONE HCL 1 MG/ML IJ SOLN: 0.2000 mg | INTRAMUSCULAR | Status: DC | PRN

## 2022-01-26 MED ORDER — AMISULPRIDE (ANTIEMETIC) 5 MG/2ML IV SOLN
10.0000 mg | Freq: Once | INTRAVENOUS | Status: AC | PRN
  Administered 2022-01-26: 10 mg via INTRAVENOUS

## 2022-01-26 MED ORDER — EPHEDRINE SULFATE-NACL 50-0.9 MG/10ML-% IV SOSY
PREFILLED_SYRINGE | INTRAVENOUS | Status: DC | PRN
  Administered 2022-01-26: 10 mg via INTRAVENOUS

## 2022-01-26 MED ORDER — POVIDONE-IODINE 10 % EX SWAB: 2.0000 | Freq: Once | CUTANEOUS | Status: DC

## 2022-01-26 MED ORDER — GLYCOPYRROLATE PF 0.2 MG/ML IJ SOSY
PREFILLED_SYRINGE | INTRAMUSCULAR | Status: DC | PRN
  Administered 2022-01-26: .2 mg via INTRAVENOUS

## 2022-01-26 MED ORDER — MIDAZOLAM HCL 2 MG/2ML IJ SOLN
INTRAMUSCULAR | Status: DC | PRN
  Administered 2022-01-26: 2 mg via INTRAVENOUS

## 2022-01-26 MED ORDER — ACETAMINOPHEN 325 MG PO TABS: 650.0000 mg | ORAL_TABLET | Freq: Four times a day (QID) | ORAL | Status: DC | PRN

## 2022-01-26 MED ORDER — ONDANSETRON HCL 4 MG/2ML IJ SOLN: 4.0000 mg | Freq: Four times a day (QID) | INTRAMUSCULAR | Status: DC | PRN

## 2022-01-26 MED ORDER — FENTANYL CITRATE (PF) 250 MCG/5ML IJ SOLN
INTRAMUSCULAR | Status: AC
  Filled 2022-01-26: qty 5

## 2022-01-26 MED ORDER — HYDROMORPHONE HCL 1 MG/ML IJ SOLN
0.2500 mg | INTRAMUSCULAR | Status: DC | PRN
  Administered 2022-01-26: 0.25 mg via INTRAVENOUS

## 2022-01-26 SURGICAL SUPPLY — 52 items
ADH SKN CLS APL DERMABOND .7 (GAUZE/BANDAGES/DRESSINGS) ×2
BAG DECANTER FOR FLEXI CONT (MISCELLANEOUS) ×1 IMPLANT
BARRIER ADHS 3X4 INTERCEED (GAUZE/BANDAGES/DRESSINGS) IMPLANT
BRR ADH 4X3 ABS CNTRL BYND (GAUZE/BANDAGES/DRESSINGS)
CABLE HIGH FREQUENCY MONO STRZ (ELECTRODE) ×3 IMPLANT
COVER MAYO STAND STRL (DRAPES) ×3 IMPLANT
DERMABOND ADVANCED (GAUZE/BANDAGES/DRESSINGS) ×1
DERMABOND ADVANCED .7 DNX12 (GAUZE/BANDAGES/DRESSINGS) ×2 IMPLANT
DURAPREP 26ML APPLICATOR (WOUND CARE) ×3 IMPLANT
GLOVE BIO SURGEON STRL SZ 6 (GLOVE) ×3 IMPLANT
GLOVE BIOGEL PI IND STRL 6.5 (GLOVE) ×4 IMPLANT
GLOVE BIOGEL PI INDICATOR 6.5 (GLOVE) ×2
GOWN STRL REUS W/ TWL LRG LVL3 (GOWN DISPOSABLE) ×4 IMPLANT
GOWN STRL REUS W/TWL LRG LVL3 (GOWN DISPOSABLE) ×12
HIBICLENS CHG 4% 4OZ BTL (MISCELLANEOUS) ×4 IMPLANT
IV NS 1000ML (IV SOLUTION) ×6
IV NS 1000ML BAXH (IV SOLUTION) IMPLANT
KIT TURNOVER CYSTO (KITS) ×3 IMPLANT
L-HOOK LAP DISP 36CM (ELECTROSURGICAL) ×3
LHOOK LAP DISP 36CM (ELECTROSURGICAL) IMPLANT
LIGASURE VESSEL 5MM BLUNT TIP (ELECTROSURGICAL) ×3 IMPLANT
MANIPULATOR ADVINCU DEL 3.5 PL (MISCELLANEOUS) ×1 IMPLANT
NDL INSUFFLATION 14GA 120MM (NEEDLE) ×2 IMPLANT
NEEDLE INSUFFLATION 14GA 120MM (NEEDLE) ×6 IMPLANT
NS IRRIG 1000ML POUR BTL (IV SOLUTION) ×2 IMPLANT
NS IRRIG 500ML POUR BTL (IV SOLUTION) ×1 IMPLANT
PACK LAPAROSCOPY BASIN (CUSTOM PROCEDURE TRAY) ×3 IMPLANT
PACK TRENDGUARD 450 HYBRID PRO (MISCELLANEOUS) IMPLANT
PENCIL BUTTON HOLSTER BLD 10FT (ELECTRODE) ×1 IMPLANT
SCISSORS LAP 5X35 DISP (ENDOMECHANICALS) ×2 IMPLANT
SET CYSTO W/LG BORE CLAMP LF (SET/KITS/TRAYS/PACK) IMPLANT
SET SUCTION IRRIG HYDROSURG (IRRIGATION / IRRIGATOR) ×3 IMPLANT
SET TRI-LUMEN FLTR TB AIRSEAL (TUBING) ×3 IMPLANT
SLEEVE ADV FIXATION 5X100MM (TROCAR) ×3 IMPLANT
SPIKE FLUID TRANSFER (MISCELLANEOUS) ×1 IMPLANT
SUT MNCRL AB 3-0 PS2 27 (SUTURE) ×3 IMPLANT
SUT VIC AB 0 CT1 18XCR BRD8 (SUTURE) IMPLANT
SUT VIC AB 0 CT1 8-18 (SUTURE) ×3
SUT VICRYL 0 UR6 27IN ABS (SUTURE) IMPLANT
SUT VLOC 180 0 9IN  GS21 (SUTURE)
SUT VLOC 180 0 9IN GS21 (SUTURE) IMPLANT
SYR 10ML LL (SYRINGE) IMPLANT
SYR 50ML LL SCALE MARK (SYRINGE) ×4 IMPLANT
TOWEL OR 17X26 10 PK STRL BLUE (TOWEL DISPOSABLE) ×3 IMPLANT
TRAY FOLEY W/BAG SLVR 14FR LF (SET/KITS/TRAYS/PACK) ×3 IMPLANT
TRENDGUARD 450 HYBRID PRO PACK (MISCELLANEOUS) ×3
TROCAR PORT AIRSEAL 5X120 (TROCAR) ×1 IMPLANT
TROCAR PORT AIRSEAL 8X120 (TROCAR) IMPLANT
TROCAR Z-THREAD FIOS 11X100 BL (TROCAR) IMPLANT
TROCAR Z-THREAD FIOS 5X100MM (TROCAR) ×3 IMPLANT
UNDERPAD 30X36 HEAVY ABSORB (UNDERPADS AND DIAPERS) ×3 IMPLANT
WARMER LAPAROSCOPE (MISCELLANEOUS) ×3 IMPLANT

## 2022-01-26 NOTE — Transfer of Care (Signed)
Immediate Anesthesia Transfer of Care Note  Patient: Debbie Berry  Procedure(s) Performed: Procedure(s) (LRB): TOTAL LAPAROSCOPIC HYSTERECTOMY WITH SALPINGO-OOPHORECTOMY (N/A) CYSTOSCOPY (N/A)  Patient Location: PACU  Anesthesia Type: General  Level of Consciousness: awake, alert  and oriented  Airway & Oxygen Therapy: Patient Spontanous Breathing and Patient connected to nasal cannula oxygen  Post-op Assessment: Report given to PACU RN and Post -op Vital signs reviewed and stable  Post vital signs: Reviewed and stable  Complications: No apparent anesthesia complications  Last Vitals:  Vitals Value Taken Time  BP 102/77 01/26/22 1445  Temp 36.4 C 01/26/22 1438  Pulse 64 01/26/22 1446  Resp 12 01/26/22 1446  SpO2 100 % 01/26/22 1446  Vitals shown include unvalidated device data.  Last Pain:  Vitals:   01/26/22 1144  TempSrc: Oral  PainSc: 0-No pain      Patients Stated Pain Goal: 6 (99/77/41 4239)  Complications: No notable events documented.

## 2022-01-26 NOTE — Anesthesia Procedure Notes (Signed)
Procedure Name: Intubation Date/Time: 01/26/2022 2:38 AM  Performed by: Mechele Claude, CRNAPre-anesthesia Checklist: Patient identified, Emergency Drugs available, Suction available and Patient being monitored Patient Re-evaluated:Patient Re-evaluated prior to induction Oxygen Delivery Method: Circle system utilized Preoxygenation: Pre-oxygenation with 100% oxygen Induction Type: IV induction Ventilation: Mask ventilation without difficulty Laryngoscope Size: Mac and 3 Tube type: Oral Tube size: 7.0 mm Number of attempts: 1 Airway Equipment and Method: Stylet and Oral airway Placement Confirmation: ETT inserted through vocal cords under direct vision, positive ETCO2 and breath sounds checked- equal and bilateral Secured at: 21 cm Tube secured with: Tape Dental Injury: Teeth and Oropharynx as per pre-operative assessment

## 2022-01-26 NOTE — Brief Op Note (Signed)
01/26/2022  2:30 PM  PATIENT:  Debbie Berry  51 y.o. female  PRE-OPERATIVE DIAGNOSIS:  simple endometrial hyperplasia, history of estrogen receptor positive breast cancer, desire for risk reducing oophorectomy  POST-OPERATIVE DIAGNOSIS:  simple endometrial hyperplasia, history of estrogen receptor positive breast cancer, desire for risk reducing oophorectomy  PROCEDURE:  Procedure(s): TOTAL LAPAROSCOPIC HYSTERECTOMY WITH SALPINGO-OOPHORECTOMY (N/A) CYSTOSCOPY (N/A)  SURGEON:  Surgeon(s) and Role:    * Rowland Lathe, MD - Primary    * Bobbye Charleston, MD - Assisting  ANESTHESIA:   local and general  EBL:  153m   BLOOD ADMINISTERED:none  DRAINS: Urinary Catheter (Foley)   LOCAL MEDICATIONS USED:  LIDOCAINE  and Amount: 10 ml  SPECIMEN:  Source of Specimen:  uterus, cervix, bilateral ovaries, bilateral fallopian tubes  DISPOSITION OF SPECIMEN:  PATHOLOGY  COUNTS:  YES  TOURNIQUET:  * No tourniquets in log *  DICTATION: .Note written in EPIC  PLAN OF CARE: Discharge to home after PACU  PATIENT DISPOSITION:  PACU - hemodynamically stable.   Delay start of Pharmacological VTE agent (>24hrs) due to surgical blood loss or risk of bleeding: not applicable

## 2022-01-26 NOTE — Anesthesia Preprocedure Evaluation (Addendum)
Anesthesia Evaluation  Patient identified by MRN, date of birth, ID band Patient awake    Reviewed: Allergy & Precautions, NPO status , Patient's Chart, lab work & pertinent test results  History of Anesthesia Complications (+) PONV and history of anesthetic complications  Airway Mallampati: II  TM Distance: >3 FB Neck ROM: Full    Dental  (+) Dental Advisory Given, Teeth Intact   Pulmonary former smoker,    Pulmonary exam normal        Cardiovascular negative cardio ROS Normal cardiovascular exam     Neuro/Psych  Headaches,  Vertigo  negative psych ROS   GI/Hepatic negative GI ROS, Neg liver ROS,   Endo/Other  negative endocrine ROS  Renal/GU negative Renal ROS     Musculoskeletal negative musculoskeletal ROS (+)   Abdominal   Peds  Hematology negative hematology ROS (+)   Anesthesia Other Findings   Reproductive/Obstetrics                             Anesthesia Physical  Anesthesia Plan  ASA: 2  Anesthesia Plan: General   Post-op Pain Management: Dilaudid IV, Toradol IV (intra-op)* and Ofirmev IV (intra-op)*   Induction: Intravenous  PONV Risk Score and Plan: 4 or greater and Treatment may vary due to age or medical condition, Ondansetron, Dexamethasone, Midazolam and Droperidol  Airway Management Planned: Oral ETT  Additional Equipment: None  Intra-op Plan:   Post-operative Plan: Extubation in OR  Informed Consent: I have reviewed the patients History and Physical, chart, labs and discussed the procedure including the risks, benefits and alternatives for the proposed anesthesia with the patient or authorized representative who has indicated his/her understanding and acceptance.     Dental advisory given  Plan Discussed with: CRNA and Anesthesiologist  Anesthesia Plan Comments:         Anesthesia Quick Evaluation

## 2022-01-26 NOTE — Op Note (Signed)
01/26/2022   2:30 PM   PATIENT:  Debbie Berry  51 y.o. female   PRE-OPERATIVE DIAGNOSIS:  simple endometrial hyperplasia, history of estrogen receptor positive breast cancer, desire for risk reducing oophorectomy   POST-OPERATIVE DIAGNOSIS:  simple endometrial hyperplasia, history of estrogen receptor positive breast cancer, desire for risk reducing oophorectomy   PROCEDURE:  Procedure(s): TOTAL LAPAROSCOPIC HYSTERECTOMY WITH SALPINGO-OOPHORECTOMY (N/A) CYSTOSCOPY (N/A)   SURGEON:  Surgeon(s) and Role:    * Rowland Lathe, MD - Primary    * Bobbye Charleston, MD - Assisting   ANESTHESIA:   local and general   EBL:  120m    BLOOD ADMINISTERED:none   DRAINS: Urinary Catheter (Foley)    LOCAL MEDICATIONS USED:  LIDOCAINE  and Amount: 10 ml   SPECIMEN:  Source of Specimen:  uterus, cervix, bilateral ovaries, bilateral fallopian tubes   DISPOSITION OF SPECIMEN:  PATHOLOGY   COUNTS:  YES   PLAN OF CARE: Discharge to home after PACU   PATIENT DISPOSITION:  PACU - hemodynamically stable.  FINDINGS:  Uterus sounded to 6cm.  On laparoscopic view, small uterus with 1 small subserosal fibroid at fundus. Filmy omental adhesion to umbilical hernia mesh. TNormal appearing ovaries and fallopian tubes bilaterally. Normal appearing ureters bilaterally.  Cystoscopy demonstrated intact bladder and bilateral ureteral jets.    DESCRIPTION OF PROCEDURE: After consent was verified the patient was taken to the operating room.  After adequate anesthesia was achieved the patient was positioned in the dorsal lithotomy position with legs in stirrups. SCDs were in place and cycling.  Exam revealed a 6 week size uterus. The patient was prepped and draped in normal sterile fashion. Gentamicin and Flagyl were given for infection prophylaxis. A speculum was placed in the vagina and the anterior lip of the cervix was grasped with a tenaculum. A stay suture was placed on the anterior lip of the cervix.  The uterus was sounded to 6cm and the cervix dilated with Hank dilators.  The 3.5 cm delineating ring was assembled and placed in the proper fashion.  The speculum was removed and the bladder was drained with a Foley catheter.    Attention was turned to the abdomen. An 564mincision was made in the left upper quadrant. Orogastric tube confirmed to be on suction. The Veress needle was inserted through the incision. Correct placement was confirmed by opening pressure of 4m68m. The abdomen was insufflated with CO2 gas to a total pressure of 14m70m The 5 mm trochar was inserted into the abdominal cavity under direct laparoscopic visualization. The laparoscope was inserted and confirmed no vascular or visceral trauma from entry. The patient was placed in Trendelenburg.  Filmy omental adhesions to the umbilical hernia mesh were lysed with the Ligasure. Two additional 4mm 7mchars were placed, one above the umbilicus and one on the right mid abdomen. All trochars were inserted under direct visualization of the camera.     Survey of the abdomen revealed findings as noted above. Bilateral ureters were visualized peristalsing. The right round ligament was transected and peritoneum opened lateral to the right infundibulopelvic ligament. The right infundibulopelvic ligament was isolated and ligated. The vesicouterine peritoneum was opened and the bladder was dissected off the lower uterine segment and upper vagina. The right uterine artery was skeletonized and ligated with the Ligasure. The same process was repeated on the left side.   The cervicovaginal junction was identified with the delineating ring and L hook monopolar cautery were used to enter the vagina and incise  circumferentially around the cervicovaginal junction. The uterus, cervix, bilateral ovaries, and bilateral fallopian tubes were removed through the vagina and sent for pathology. Hemostasis was confirmed at the vaginal cuff.   Pneumoperitoneum was  released, instruments were removed, and attention was turned to the vaginal field. A weight speculum was inserted into the vagina and vaginal cuff angles were grasped with Allis clamps. 0 vicryl suture was used in four figure of 8 stitches to close the vaginal cuff. The vaginal cuff was palpated and confirmed the vaginal cuff was intact without defect. Fluorescein was administered.   Foley catheter was removed. A cystoscopy was performed which demonstrated intact bladder and active bilateral ureteral jets. Foley catheter was replaced.   Attention was turned backed to the abdominal field. The abdomen was insufflated.The abdomen was irrigated and suctioned. Excellent hemostasis was appreciated until 41mHg and 8 mmHg pressure.  Pneumoperitoneum was released.The port sites were closed with 3-0 monocryl in a subcuticular fashion and covered with skin glue.   The patient was awakened from anesthesia and taken to the recovery room in stable condition.   MLuther Redo MD 01/26/22 2:36 PM

## 2022-01-27 ENCOUNTER — Encounter (HOSPITAL_BASED_OUTPATIENT_CLINIC_OR_DEPARTMENT_OTHER): Payer: Self-pay | Admitting: Obstetrics and Gynecology

## 2022-01-27 NOTE — Anesthesia Postprocedure Evaluation (Signed)
Anesthesia Post Note  Patient: Debbie Berry  Procedure(s) Performed: TOTAL LAPAROSCOPIC HYSTERECTOMY WITH SALPINGO-OOPHORECTOMY (Abdomen) CYSTOSCOPY (Bladder)     Patient location during evaluation: PACU Anesthesia Type: General Level of consciousness: awake and alert Pain management: pain level controlled Vital Signs Assessment: post-procedure vital signs reviewed and stable Respiratory status: spontaneous breathing, nonlabored ventilation and respiratory function stable Cardiovascular status: blood pressure returned to baseline and stable Postop Assessment: no apparent nausea or vomiting Anesthetic complications: no   No notable events documented.  Last Vitals:  Vitals:   01/26/22 1706 01/26/22 1818  BP: 117/73 110/72  Pulse: 79 92  Resp: 16 15  Temp: 37.1 C 36.9 C  SpO2: 98% 97%    Last Pain:  Vitals:   01/27/22 1000  TempSrc:   PainSc: 5    Pain Goal: Patients Stated Pain Goal: 5 (01/26/22 1723)                 Lynda Rainwater

## 2022-02-01 LAB — SURGICAL PATHOLOGY

## 2022-02-22 ENCOUNTER — Inpatient Hospital Stay: Payer: BC Managed Care – PPO | Attending: Hematology and Oncology

## 2022-02-22 ENCOUNTER — Other Ambulatory Visit: Payer: Self-pay | Admitting: Genetic Counselor

## 2022-02-22 DIAGNOSIS — Z923 Personal history of irradiation: Secondary | ICD-10-CM | POA: Insufficient documentation

## 2022-02-22 DIAGNOSIS — C50211 Malignant neoplasm of upper-inner quadrant of right female breast: Secondary | ICD-10-CM | POA: Insufficient documentation

## 2022-02-22 DIAGNOSIS — Z90722 Acquired absence of ovaries, bilateral: Secondary | ICD-10-CM | POA: Insufficient documentation

## 2022-02-22 DIAGNOSIS — Z170 Estrogen receptor positive status [ER+]: Secondary | ICD-10-CM | POA: Insufficient documentation

## 2022-02-22 DIAGNOSIS — Z87891 Personal history of nicotine dependence: Secondary | ICD-10-CM | POA: Insufficient documentation

## 2022-02-22 DIAGNOSIS — Z79811 Long term (current) use of aromatase inhibitors: Secondary | ICD-10-CM | POA: Insufficient documentation

## 2022-02-22 DIAGNOSIS — Z90710 Acquired absence of both cervix and uterus: Secondary | ICD-10-CM | POA: Insufficient documentation

## 2022-02-22 DIAGNOSIS — C562 Malignant neoplasm of left ovary: Secondary | ICD-10-CM | POA: Insufficient documentation

## 2022-02-23 ENCOUNTER — Telehealth: Payer: Self-pay | Admitting: Genetic Counselor

## 2022-02-23 NOTE — Telephone Encounter (Signed)
Patient wishes to have additional genetic testing.    In 2020, at the age of 61, Debbie Berry was diagnosed with Invasive Ductal Carcinoma, ER+/PR+/Her2-, of the right breast.  She had negative genetic testing for the [facility] Breast cancer panel offered by [facility] includes sequencing and rearrangement analysis for the following 9 genes:  ATM, BRCA1, BRCA2, CDH1, CHEK2, PALB2, PTEN, STK11 and TP53.    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 She did not report additoinal family hiistory of cancer/tumor beyond what was reported in her genetics visit from 2020 (FH cervical cancer in paternal aunt and paternal grandmother).   We discussed that additional genetic testing is indicated given there are several additional genes now known to be assocaited with breast cancer for which were not included in her testing-- including BARD1, RAD51C, and RAD51D.   We reviewed billing policies of laboratory.  She provided informed consent for updated genetic testing and labs were drawn 9/5 at [facility].  Following panel was ordered-- The Multi-Cancer + RNA Panel offered by [facility] includes sequencing and/or deletion/duplication analysis of the following 84 genes:  AIP*, ALK, APC*, ATM*, AXIN2*, BAP1*, BARD1*, BLM*, BMPR1A*, BRCA1*, BRCA2*, BRIP1*, CASR, CDC73*, CDH1*, CDK4, CDKN1B*, CDKN1C*, CDKN2A, CEBPA, CHEK2*, CTNNA1*, DICER1*, DIS3L2*, EGFR, EPCAM, FH*, FLCN*, GATA2*, GPC3, GREM1, HOXB13, HRAS, KIT, MAX*, MEN1*, MET, MITF, MLH1*, MSH2*, MSH3*, MSH6*, MUTYH*, NBN*, NF1*, NF2*, NTHL1*, PALB2*, PDGFRA, PHOX2B, PMS2*, POLD1*, POLE*, POT1*, PRKAR1A*, PTCH1*, PTEN*, RAD50*, RAD51C*, RAD51D*, RB1*, RECQL4, RET, RUNX1*, SDHA*, SDHAF2*, SDHB*, SDHC*, SDHD*, SMAD4*, SMARCA4*, SMARCB1*, SMARCE1*, STK11*, SUFU*, TERC, TERT, TMEM127*, Tp53*, TSC1*, TSC2*, VHL*, WRN*, and WT1.  RNA analysis is performed for * genes.    She will be called with results when available in  approximately 2-3 weeks.

## 2022-02-24 ENCOUNTER — Telehealth: Payer: Self-pay | Admitting: *Deleted

## 2022-02-24 NOTE — Telephone Encounter (Signed)
Spoke with the patient regarding the referral to GYN oncology. Patient scheduled as new patient with Dr Berline Lopes on 9/29 at 10:30 am. Patient given an arrival time of 10 am.  Explained to the patient the the doctor will perform a pelvic exam at this visit. Patient given the policy that no visitors under the 16 yrs are allowed in the [facility]. Patient given the address/phone number for the clinic and that the center offers free valet service.

## 2022-03-02 ENCOUNTER — Ambulatory Visit (HOSPITAL_BASED_OUTPATIENT_CLINIC_OR_DEPARTMENT_OTHER): Admit: 2022-03-02 | Payer: BC Managed Care – PPO | Admitting: Obstetrics and Gynecology

## 2022-03-04 ENCOUNTER — Telehealth: Payer: Self-pay | Admitting: *Deleted

## 2022-03-04 NOTE — Telephone Encounter (Signed)
Called and moved the patient from 9/29 to 9/19

## 2022-03-08 ENCOUNTER — Inpatient Hospital Stay (HOSPITAL_BASED_OUTPATIENT_CLINIC_OR_DEPARTMENT_OTHER): Payer: BC Managed Care – PPO | Admitting: Psychiatry

## 2022-03-08 ENCOUNTER — Encounter: Payer: Self-pay | Admitting: Psychiatry

## 2022-03-08 ENCOUNTER — Other Ambulatory Visit: Payer: Self-pay | Admitting: General Surgery

## 2022-03-08 ENCOUNTER — Inpatient Hospital Stay: Payer: BC Managed Care – PPO

## 2022-03-08 ENCOUNTER — Other Ambulatory Visit: Payer: Self-pay

## 2022-03-08 VITALS — BP 129/98 | HR 86 | Temp 98.9°F | Resp 14 | Ht 66.0 in | Wt 147.0 lb

## 2022-03-08 DIAGNOSIS — Z923 Personal history of irradiation: Secondary | ICD-10-CM | POA: Diagnosis not present

## 2022-03-08 DIAGNOSIS — Z7189 Other specified counseling: Secondary | ICD-10-CM | POA: Diagnosis not present

## 2022-03-08 DIAGNOSIS — C50211 Malignant neoplasm of upper-inner quadrant of right female breast: Secondary | ICD-10-CM | POA: Diagnosis not present

## 2022-03-08 DIAGNOSIS — D3912 Neoplasm of uncertain behavior of left ovary: Secondary | ICD-10-CM

## 2022-03-08 DIAGNOSIS — Z87891 Personal history of nicotine dependence: Secondary | ICD-10-CM | POA: Diagnosis not present

## 2022-03-08 DIAGNOSIS — C562 Malignant neoplasm of left ovary: Secondary | ICD-10-CM | POA: Diagnosis present

## 2022-03-08 DIAGNOSIS — C50411 Malignant neoplasm of upper-outer quadrant of right female breast: Secondary | ICD-10-CM

## 2022-03-08 DIAGNOSIS — Z170 Estrogen receptor positive status [ER+]: Secondary | ICD-10-CM | POA: Diagnosis not present

## 2022-03-08 DIAGNOSIS — Z90710 Acquired absence of both cervix and uterus: Secondary | ICD-10-CM | POA: Diagnosis not present

## 2022-03-08 DIAGNOSIS — Z90722 Acquired absence of ovaries, bilateral: Secondary | ICD-10-CM | POA: Diagnosis not present

## 2022-03-08 DIAGNOSIS — Z79811 Long term (current) use of aromatase inhibitors: Secondary | ICD-10-CM | POA: Diagnosis not present

## 2022-03-08 NOTE — Patient Instructions (Signed)
It was a pleasure to see you in clinic today. 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 -I have recommended that you get a CT scan of your chest/abdomen/pelvis as a baseline imaging study. -We will also get a baseline inhibin level today. - Return visit planned for 6 months  Thank you very much for allowing me to provide care for you today.  I appreciate your confidence in choosing our Gynecologic Oncology team at [facility].  If you have any questions about your visit today please call our office or send [place] a MyChart message and we will get back to you as soon as possible.

## 2022-03-08 NOTE — Progress Notes (Signed)
GYNECOLOGIC ONCOLOGY NEW PATIENT CONSULTATION  Date of Service: 03/08/2022 Referring Provider: [name], MD   ASSESSMENT AND PLAN: Debbie Berry is a 51 y.o. 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  Reviewed the nature of granulosa cell cancer.  Discussed that based on operative report review, there were no other findings intraoperatively.  Pathologic exam showed a small tumor with no ovarian surface involvement.  Discussed that I would not recommend any other surgery for staging purposes.  I would recommend a CT scan for baseline evaluation, although I suspect that this will be overall normal.  Patient did not have an inhibin collected perioperatively.  We will not be able to determine if this is a marker for her, but will get an inhibin A/B now to ensure that they are normalized.  Reviewed surveillance with history and exam every 6 months.  Discussed signs and symptoms to be aware of.  Patient currently scheduled to undergo additional work-up for new breast lesion.  Discussed that this is of higher priority at this time.  All questions answered.  A copy of this note was sent to the patient's referring provider.  Debbie Bell, MD Gynecologic Oncology   Medical Decision Making I personally spent  TOTAL 35 minutes face-to-face and non-face-to-face in the care of this patient, which includes all pre, intra, and post visit time on the date of service.  5 minutes spent reviewing records prior to the visit 25 Minutes in patient contact 5 minutes charting , conferring with consultants etc.   ------------  CC: Granulosa cell tumor of the ovary  HISTORY OF PRESENT ILLNESS:  Debbie Berry is a 51 y.o. woman who is seen in consultation at the request of [name], MD for evaluation of new diagnosis of granulosa cell tumor incidentally found at time of hysterectomy, BSO.  Patient underwent a total laparoscopic hysterectomy with bilateral  salpingo-oophorectomy, cystoscopy on 01/26/2022 for simple endometrial hyperplasia and a history of estrogen receptor positive breast cancer desiring risk reducing oophorectomy.  Operative report notes normal-appearing ovaries and fallopian tubes bilaterally.  Final pathology noted no residual endometrial hyperplasia or malignancy but did note a 0.9 cm sex cord stromal tumor of the left ovary favoring cellular fibroma.  Pathology sent to [facility] for review, and they favored that this represented an adult type granulosa cell tumor.  Today, patient reports that she is overall healing well from surgery.  She had been having some spotting following surgery which is since resolved.  She does however report that a new breast lump was identified at a recent visit for which she is scheduled to undergo additional work-up.  In terms of her breast cancer treatment, she underwent a lumpectomy and radiation therapy in 2020 followed by hormone therapy.  She currently is on anastrozole.   TREATMENT HISTORY: Oncology History  Malignant neoplasm of upper-inner quadrant of right breast in female, estrogen receptor positive ([place])  12/12/2018 Initial Diagnosis   Screening mammogram detected a distortion in the right breast, measuring 72m on diagnostic mammogram and [place] Biopsy confirmed grade 1 IDC with DCIS, HER-2 negative by FISH, ER 90%, PR 90%, Ki67 10%.    12/19/2018 Cancer Staging   Staging form: Breast, AJCC 8th Edition - Clinical stage from 12/19/2018: Stage IA (cT1b, cN0, cM0, G1, ER+, PR+, HER2-) - Signed by GNicholas Lose MD on 12/19/2018   01/02/2019 Genetic Testing   Negative genetic testing on the [facility] Breast Cancer STAT panel. The STAT Breast cancer panel offered  by [facility] includes sequencing and rearrangement analysis for the following 9 genes:  ATM, BRCA1, BRCA2, CDH1, CHEK2, PALB2, PTEN, STK11 and TP53. The report date is 01/02/2019.   01/17/2019 Surgery   Right lumpectomy Marlou Starks): IDC with DCIS, 1.2cm, grade  1, clear margins, 2 axillary lymph nodes negative.   01/17/2019 Cancer Staging   Staging form: Breast, AJCC 8th Edition - Pathologic stage from 01/17/2019: Stage IA (pT1c, pN0, cM0, G1, ER+, PR+, HER2-, Oncotype DX score: 24) - Signed by Gardenia Phlegm, NP on 05/27/2019   01/31/2019 Oncotype testing   Score of 24 with 10% chance of distant recurrence in 9 years without systemic treatment.    02/15/2019 - 03/15/2019 Radiation Therapy   Adjuvant radiation treatment   03/2019 -  Anti-estrogen oral therapy   Anastrozole     PAST MEDICAL HISTORY: Past Medical History:  Diagnosis Date   Complication of anesthesia    Patient stated that after her surgery at [facility] in March 2023, she experienced nausae.   Family history of cervical cancer    History of COVID-19 04/2021   per pt mild symptoms that resolved   History of external beam radiation therapy    02-15-2019  to 03-15-2019  right breast cancer   Hyperlipidemia    Malignant neoplasm of upper-inner quadrant of right breast in female, estrogen receptor positive ([place]) 11/2018   oncologist--- dr Lindi Adie;  dx 06/ 2020,  IDC , Stage IA, ER/ PR +;  completed radiation 03-15-2019   Migraine    PMB (postmenopausal bleeding)    PONV (postoperative nausea and vomiting)    Vertigo    Wears contact lenses     PAST SURGICAL HISTORY: Past Surgical History:  Procedure Laterality Date   BREAST BIOPSY Right 11/2018   BREAST LUMPECTOMY WITH RADIOACTIVE SEED AND SENTINEL LYMPH NODE BIOPSY Right 01/17/2019   Procedure: RIGHT BREAST LUMPECTOMY WITH RADIOACTIVE SEED AND SENTINEL LYMPH NODE BIOPSY;  Surgeon: Jovita Kussmaul, MD;  Location: [place];  Service: General;  Laterality: Right;   CHOLECYSTECTOMY, LAPAROSCOPIC  2013   COLONOSCOPY  02/2020   CYSTOSCOPY N/A 01/26/2022   Procedure: CYSTOSCOPY;  Surgeon: Rowland Lathe, MD;  Location: [place];  Service: Gynecology;  Laterality: N/A;   DILATATION &  CURETTAGE/HYSTEROSCOPY WITH MYOSURE N/A 09/15/2021   Procedure: DILATATION & CURETTAGE/HYSTEROSCOPY;  Surgeon: Rowland Lathe, MD;  Location: [place];  Service: Gynecology;  Laterality: N/A;   INCISIONAL HERNIA REPAIR  2015   POLYPECTOMY N/A 09/15/2021   Procedure: POLYPECTOMY WITH MYOSURE;  Surgeon: Rowland Lathe, MD;  Location: [place];  Service: Gynecology;  Laterality: N/A;   TOTAL LAPAROSCOPIC HYSTERECTOMY WITH SALPINGECTOMY N/A 01/26/2022   Procedure: TOTAL LAPAROSCOPIC HYSTERECTOMY WITH SALPINGO-OOPHORECTOMY;  Surgeon: Rowland Lathe, MD;  Location: [place];  Service: Gynecology;  Laterality: N/A;    OB/GYN HISTORY: OB History  No obstetric history on file.      Age at menarche: 44 Age at menopause: 71 Hx of HRT: Anastrozole Hx of STI: No Last pap: 01/05/2021 NILM History of abnormal pap smears: HPV, possible abnormal Pap smear requiring procedure in her early 21s.  SCREENING STUDIES:  Last mammogram: 11/2021 Last colonoscopy: 2021  MEDICATIONS:  Current Outpatient Medications:    anastrozole (ARIMIDEX) 1 MG tablet, Take 1 tablet (1 mg total) by mouth daily. (Patient taking differently: Take 1 mg by mouth at bedtime.), Disp: 90 tablet, Rfl: 3   Cholecalciferol (VITAMIN D3) 50 MCG (  2000 UT) TABS, Take by mouth daily., Disp: , Rfl:    COLLAGEN PO, Take by mouth daily. POWDER, Disp: , Rfl:    Cyanocobalamin (VITAMIN B 12 PO), Take by mouth daily at 12 noon., Disp: , Rfl:    Omega-3 Fatty Acids (FISH OIL) 1200 MG CAPS, Take 1 capsule by mouth daily., Disp: , Rfl:   ALLERGIES: Allergies  Allergen Reactions   Azithromycin Anaphylaxis   Penicillins Shortness Of Breath   Nsaids Swelling    Swelling to lips and eyes (PER PT ALL NSAIDS)    FAMILY HISTORY: Family History  Problem Relation Age of Onset   Colon polyps Mother    Cervical cancer Paternal Grandmother    Cervical cancer Paternal Aunt 44    Colon cancer Neg Hx    Esophageal cancer Neg Hx    Rectal cancer Neg Hx    Stomach cancer Neg Hx     SOCIAL HISTORY: Social History   Socioeconomic History   Marital status: Married    Spouse name: Not on file   Number of children: Not on file   Years of education: Not on file   Highest education level: Not on file  Occupational History   Not on file  Tobacco Use   Smoking status: Former    Years: 0.50    Types: Cigarettes    Quit date: 1990    Years since quitting: 33.7   Smokeless tobacco: Never  Vaping Use   Vaping Use: Never used  Substance and Sexual Activity   Alcohol use: Yes    Comment: rare   Drug use: Never   Sexual activity: Not on file  Other Topics Concern   Not on file  Social History Narrative   Not on file   Social Determinants of Health   Financial Resource Strain: Not on file  Food Insecurity: Not on file  Transportation Needs: No Transportation Needs (02/06/2019)   PRAPARE - Hydrologist (Medical): No    Lack of Transportation (Non-Medical): No  Physical Activity: Not on file  Stress: Not on file  Social Connections: Not on file  Intimate Partner Violence: Not At Risk (02/06/2019)   Humiliation, Afraid, Rape, and Kick questionnaire    Fear of Current or Ex-Partner: No    Emotionally Abused: No    Physically Abused: No    Sexually Abused: No    REVIEW OF SYSTEMS: New patient intake form was reviewed.  Complete 10-system review is negative except for the following: Nighttime urinary frequency  PHYSICAL EXAM: LMP 12/19/2018 (Exact Date) Comment: Patient stopped having periods due to taking Arimidex per pt. Constitutional: No acute distress. Neuro/Psych: Alert, oriented.  Head and Neck: Normocephalic, atraumatic. Neck symmetric without masses. Sclera anicteric.  Respiratory: Normal work of breathing. Clear to auscultation bilaterally. Cardiovascular: Regular rate and rhythm, no murmurs, rubs, or  gallops. Abdomen: Normoactive bowel sounds. Soft, non-distended, non-tender to palpation. No masses or hepatosplenomegaly appreciated. No evidence of hernia. No palpable fluid wave.  Well-healing laparoscopic incisions. Extremities: Grossly normal range of motion. Warm, well perfused. No edema bilaterally. Skin: No rashes or lesions. Lymphatic: No cervical, supraclavicular, or inguinal adenopathy. Genitourinary: Deferred.  (patient scheduled for follow-up with her gynecologic surgeon tomorrow)  LABORATORY AND RADIOLOGIC DATA: Outside medical records were reviewed to synthesize the above history, along with the history and physical obtained during the visit.  Outside laboratory, pathology, and imaging reports were reviewed, with pertinent results below.  WBC  Date Value Ref Range  Status  01/24/2022 4.9 4.0 - 10.5 K/uL Final   Hemoglobin  Date Value Ref Range Status  01/24/2022 14.6 12.0 - 15.0 g/dL Final  12/19/2018 14.6 12.0 - 15.0 g/dL Final   HCT  Date Value Ref Range Status  01/24/2022 43.3 36.0 - 46.0 % Final   Platelets  Date Value Ref Range Status  01/24/2022 225 150 - 400 K/uL Final   Platelet Count  Date Value Ref Range Status  12/19/2018 256 150 - 400 K/uL Final   Creatinine  Date Value Ref Range Status  12/19/2018 0.80 0.44 - 1.00 mg/dL Final   Creatinine, Ser  Date Value Ref Range Status  01/24/2022 0.68 0.44 - 1.00 mg/dL Final   AST  Date Value Ref Range Status  12/19/2018 20 15 - 41 U/L Final   ALT  Date Value Ref Range Status  12/19/2018 32 0 - 44 U/L Final   Pathology review at [place] (01/26/22) DIAGNOSIS    A.  Uterus, cervix, bilateral fallopian tubes and ovaries, hysterectomy, bilateral salpingo-oophorectomy.  Outside consult, [phone], [facility], [place], [place].  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   Left fallopian tube, right fallopian tube, and right ovary: Negative for malignancy.   Uterus: Endometrium: Inactive, negative for residual hyperplasia. Myometrium: Adenomyosis and leiomyoma. Cervix: Nabothian cyst. Serosa: No pathologic diagnosis.  Electronically signed by Rush Barer, MD on 02/16/2022 at  3:14 PM  Diagnostic Comment    Thank you for sending the above case in consultation.  As you describe, there is an approximately 0.9 cm nodule in the left ovary that was grossly described as being tan-yellow.  It is composed of a mixture of spindle cells with scant cytoplasm and more plump epithelioid cells with more abundant cytoplasm.  There is no significant nuclear atypia and mitoses are very sparse.  Your immunohistochemical stains for inhibin and calretinin are both positive in these cells.  A few nuclear grooves are present, but they are certainly not prominent.  The differential diagnosis is between a fibrothecoma with a relatively prominent thecomatous component and a small luteinized granulosa cell tumor.  This distinction can be very difficult.  However, I believe your reticulin stain shows fairly convincing loss of pericellular reticulin in the areas of the plump larger cells, which would favor a diagnosis of granulosa cell tumor over fibrothecoma.  For this reason, I would favor calling this a luteinized granulosa cell tumor.  Dr. Delmer Islam has also reviewed this case and concurs.   Again, thank you for sending this case, and please do not hesitate to contact me if I can be of further help with this patient.Marland Kitchen

## 2022-03-10 ENCOUNTER — Other Ambulatory Visit: Payer: Self-pay | Admitting: General Surgery

## 2022-03-10 ENCOUNTER — Ambulatory Visit
Admission: RE | Admit: 2022-03-10 | Discharge: 2022-03-10 | Disposition: A | Discharge status: Home / Self Care | Payer: BC Managed Care – PPO | Source: Ambulatory Visit | Attending: General Surgery | Admitting: General Surgery

## 2022-03-10 DIAGNOSIS — C50411 Malignant neoplasm of upper-outer quadrant of right female breast: Secondary | ICD-10-CM

## 2022-03-10 DIAGNOSIS — N6310 Unspecified lump in the right breast, unspecified quadrant: Secondary | ICD-10-CM

## 2022-03-10 LAB — INHIBIN A: Inhibin-A: <0.3 pg/mL

## 2022-03-14 LAB — INHIBIN B: Inhibin B: <7 pg/mL

## 2022-03-16 ENCOUNTER — Ambulatory Visit
Admission: RE | Admit: 2022-03-16 | Discharge: 2022-03-16 | Disposition: A | Discharge status: Home / Self Care | Payer: BC Managed Care – PPO | Source: Ambulatory Visit | Attending: General Surgery | Admitting: General Surgery

## 2022-03-16 DIAGNOSIS — N6310 Unspecified lump in the right breast, unspecified quadrant: Secondary | ICD-10-CM

## 2022-03-17 ENCOUNTER — Ambulatory Visit (HOSPITAL_COMMUNITY)
Admission: RE | Admit: 2022-03-17 | Discharge: 2022-03-17 | Disposition: A | Discharge status: Home / Self Care | Payer: BC Managed Care – PPO | Source: Ambulatory Visit | Attending: Psychiatry | Admitting: Psychiatry

## 2022-03-17 DIAGNOSIS — D3912 Neoplasm of uncertain behavior of left ovary: Secondary | ICD-10-CM | POA: Insufficient documentation

## 2022-03-17 MED ORDER — SODIUM CHLORIDE (PF) 0.9 % IJ SOLN
INTRAMUSCULAR | Status: AC
  Filled 2022-03-17: qty 50

## 2022-03-17 MED ORDER — IOHEXOL 300 MG/ML  SOLN
100.0000 mL | Freq: Once | INTRAMUSCULAR | Status: AC | PRN
  Administered 2022-03-17: 100 mL via INTRAVENOUS

## 2022-03-18 ENCOUNTER — Ambulatory Visit: Payer: BC Managed Care – PPO | Admitting: Gynecologic Oncology

## 2022-03-18 ENCOUNTER — Ambulatory Visit: Payer: Self-pay | Admitting: Genetic Counselor

## 2022-03-18 DIAGNOSIS — Z1379 Encounter for other screening for genetic and chromosomal anomalies: Secondary | ICD-10-CM

## 2022-03-18 DIAGNOSIS — C50211 Malignant neoplasm of upper-inner quadrant of right female breast: Secondary | ICD-10-CM

## 2022-03-18 NOTE — Progress Notes (Signed)
HPI:   Ms. Lievanos was previously seen in the [facility] clinic due to a personal history of breast cancer and concerns regarding a hereditary predisposition to cancer. She contacted the genetics team in early September of 2023 in order to have updated genetic testing.    CANCER HISTORY:   In 2020, at the age of 51, Ms. Mathieu was diagnosed with Invasive Ductal Carcinoma, ER+/PR+/Her2-, of the right breast.  She had negative genetic testing for the [facility] Breast cancer panel offered by [facility] includes sequencing and rearrangement analysis for the following 9 genes:  ATM, BRCA1, BRCA2, CDH1, CHEK2, PALB2, PTEN, STK11 and TP53.     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    Oncology History  Malignant neoplasm of upper-inner quadrant of right breast in female, estrogen receptor positive ([place])  12/12/2018 Initial Diagnosis   Screening mammogram detected a distortion in the right breast, measuring 108m on diagnostic mammogram and [place] Biopsy confirmed grade 1 IDC with DCIS, HER-2 negative by FISH, ER 90%, PR 90%, Ki67 10%.    12/19/2018 Cancer Staging   Staging form: Breast, AJCC 8th Edition - Clinical stage from 12/19/2018: Stage IA (cT1b, cN0, cM0, G1, ER+, PR+, HER2-) - Signed by GNicholas Lose MD on 12/19/2018   01/02/2019 Genetic Testing   Negative genetic testing on the [facility] Breast Cancer STAT panel. The STAT Breast cancer panel offered by [facility] includes sequencing and rearrangement analysis for the following 9 genes:  ATM, BRCA1, BRCA2, CDH1, CHEK2, PALB2, PTEN, STK11 and TP53. The report date is 01/02/2019.   01/17/2019 Surgery   Right lumpectomy (Marlou Starks: IDC with DCIS, 1.2cm, grade 1, clear margins, 2 axillary lymph nodes negative.   01/17/2019 Cancer Staging   Staging form: Breast, AJCC 8th Edition - Pathologic stage from 01/17/2019: Stage IA (pT1c, pN0, cM0, G1, ER+, PR+, HER2-, Oncotype DX score: 24) - Signed by  CGardenia Phlegm NP on 05/27/2019   01/31/2019 Oncotype testing   Score of 24 with 10% chance of distant recurrence in 9 years without systemic treatment.    02/15/2019 - 03/15/2019 Radiation Therapy   Adjuvant radiation treatment   03/2019 -  Anti-estrogen oral therapy   Anastrozole   03/13/2022 Genetic Testing   Updated testing: Negative genetic testing on [facility] Multi-Cancer +RNA Panel.  Report date is 03/13/2022.   The Multi-Cancer + RNA Panel offered by [facility] includes sequencing and/or deletion/duplication analysis of the following 84 genes:  AIP*, ALK, APC*, ATM*, AXIN2*, BAP1*, BARD1*, BLM*, BMPR1A*, BRCA1*, BRCA2*, BRIP1*, CASR, CDC73*, CDH1*, CDK4, CDKN1B*, CDKN1C*, CDKN2A, CEBPA, CHEK2*, CTNNA1*, DICER1*, DIS3L2*, EGFR, EPCAM, FH*, FLCN*, GATA2*, GPC3, GREM1, HOXB13, HRAS, KIT, MAX*, MEN1*, MET, MITF, MLH1*, MSH2*, MSH3*, MSH6*, MUTYH*, NBN*, NF1*, NF2*, NTHL1*, PALB2*, PDGFRA, PHOX2B, PMS2*, POLD1*, POLE*, POT1*, PRKAR1A*, PTCH1*, PTEN*, RAD50*, RAD51C*, RAD51D*, RB1*, RECQL4, RET, RUNX1*, SDHA*, SDHAF2*, SDHB*, SDHC*, SDHD*, SMAD4*, SMARCA4*, SMARCB1*, SMARCE1*, STK11*, SUFU*, TERC, TERT, TMEM127*, Tp53*, TSC1*, TSC2*, VHL*, WRN*, and WT1.  RNA analysis is performed for * genes.     FAMILY HISTORY:  In 2020 We obtained a detailed, 4-generation family history.  Significant diagnoses are listed below.  She did not report any changes to family history at time of updated genetic testing in 2023.       Family History  Problem Relation Age of Onset   Cervical cancer Paternal Grandmother     Cervical cancer Paternal Aunt 630     Ms. FPhineas Inches  has 2 daughters, Lovena Le and Terrence Dupont, in their 47s. She has one brother and one sister and multiple nieces and nephews. She has three maternal aunts and one maternal uncle with multiple first cousins, none of whom have had a diagnosis of cancer.    Ms. Bassinger has one paternal aunt who was diagnosed around age 57 with cervical cancer. Ms.  Lockyer paternal grandmother was also diagnosed with cervical cancer at a younger age, possibly her 38s or 9s. There are no other cancer diagnoses on her paternal side.   Ms. Chesterfield is unaware of previous family history of genetic testing for hereditary cancer risks. Patient's maternal ancestors are of [place] descent, and paternal ancestors are of Scottish/Irish descent. There is no reported Ashkenazi Jewish ancestry. There is no known consanguinity.     GENETIC TEST RESULTS:  The [facility] Multi-Cancer +RNA Panel found no pathogenic mutations. The Multi-Cancer + RNA Panel offered by [facility] includes sequencing and/or deletion/duplication analysis of the following 84 genes:  AIP*, ALK, APC*, ATM*, AXIN2*, BAP1*, BARD1*, BLM*, BMPR1A*, BRCA1*, BRCA2*, BRIP1*, CASR, CDC73*, CDH1*, CDK4, CDKN1B*, CDKN1C*, CDKN2A, CEBPA, CHEK2*, CTNNA1*, DICER1*, DIS3L2*, EGFR, EPCAM, FH*, FLCN*, GATA2*, GPC3, GREM1, HOXB13, HRAS, KIT, MAX*, MEN1*, MET, MITF, MLH1*, MSH2*, MSH3*, MSH6*, MUTYH*, NBN*, NF1*, NF2*, NTHL1*, PALB2*, PDGFRA, PHOX2B, PMS2*, POLD1*, POLE*, POT1*, PRKAR1A*, PTCH1*, PTEN*, RAD50*, RAD51C*, RAD51D*, RB1*, RECQL4, RET, RUNX1*, SDHA*, SDHAF2*, SDHB*, SDHC*, SDHD*, SMAD4*, SMARCA4*, SMARCB1*, SMARCE1*, STK11*, SUFU*, TERC, TERT, TMEM127*, Tp53*, TSC1*, TSC2*, VHL*, WRN*, and WT1.  RNA analysis is performed for * genes.   The test report has been scanned into EPIC and is located under the Molecular Pathology section of the Results Review tab.  A portion of the result report is included below for reference. Genetic testing reported out on March 13, 2022.      Even though a pathogenic variant was not identified, possible explanations for the cancer in the family may include: There may be no hereditary risk for cancer in the family. The cancers in Ms. Shiflett and/or her family may be sporadic/familial or due to other genetic and environmental factors. There may be a gene mutation in one of these genes  that current testing methods cannot detect but that chance is small. There could be another gene that has not yet been discovered, or that we have not yet tested, that is responsible for the cancer diagnoses in the family.    Therefore, it is important to remain in touch with cancer genetics in the future so that we can continue to offer Ms. Sangalang the most up to date genetic testing.     ADDITIONAL GENETIC TESTING:  We discussed with Ms. Vicars that her genetic testing was fairly extensive.  If there are additional relevant genes identified to increase cancer risk that can be analyzed in the future, we would be happy to discuss and coordinate this testing at that time.     CANCER SCREENING RECOMMENDATIONS:  Ms. Heatherly test result is considered negative (normal).  This means that we have not identified a hereditary cause for her personal history of breast cancer or granulosa cell tumor at this time.    An individual's cancer risk and medical management are not determined by genetic test results alone. Overall cancer risk assessment incorporates additional factors, including personal medical history, family history, and any available genetic information that may result in a personalized plan for cancer prevention and surveillance. Therefore, it is recommended she continue to follow the cancer management and screening guidelines provided by  her oncology and primary healthcare provider.  RECOMMENDATIONS FOR FAMILY MEMBERS:   Since she did not inherit a identifiable mutation in a cancer predisposition gene included on this panel, her children could not have inherited a known mutation from her in one of these genes. Individuals in this family might be at some increased risk of developing cancer, over the general population risk, due to the family history of cancer.  Individuals in the family should notify their providers of the family history of cancer. We recommend women in this family have a  yearly mammogram beginning at age 11, or 4 years younger than the earliest onset of cancer, an annual clinical breast exam, and perform monthly breast self-exams.     FOLLOW-UP:  Lastly, we discussed with Ms. Newill that cancer genetics is a rapidly advancing field and it is possible that new genetic tests will be appropriate for her and/or her family members in the future. We encouraged her to remain in contact with cancer genetics on an annual basis so we can update her personal and family histories and let her know of advances in cancer genetics that may benefit this family.   Our contact number was provided. Ms. Zucco questions were answered to her satisfaction, and she knows she is welcome to call us at anytime with additional questions or concerns.   Mattia Liford M. Joette Catching, [place], [facility] Genetic Counselor [email or web address] (P) [phone]

## 2022-04-30 NOTE — Progress Notes (Signed)
Patient Care Team: Spry, Marsh Dolly., MD as PCP - General (Family Medicine) Jovita Kussmaul, MD as Consulting Physician (General Surgery) Nicholas Lose, MD as Consulting Physician (Hematology and Oncology) Eppie Gibson, MD as Attending Physician (Radiation Oncology)  DIAGNOSIS: No diagnosis found.  SUMMARY OF ONCOLOGIC HISTORY: Oncology History  Malignant neoplasm of upper-inner quadrant of right breast in female, estrogen receptor positive ([place])  12/12/2018 Initial Diagnosis   Screening mammogram detected a distortion in the right breast, measuring 43m on diagnostic mammogram and [place] Biopsy confirmed grade 1 IDC with DCIS, HER-2 negative by FISH, ER 90%, PR 90%, Ki67 10%.    12/19/2018 Cancer Staging   Staging form: Breast, AJCC 8th Edition - Clinical stage from 12/19/2018: Stage IA (cT1b, cN0, cM0, G1, ER+, PR+, HER2-) - Signed by GNicholas Lose MD on 12/19/2018   01/02/2019 Genetic Testing   Negative genetic testing on the [facility] Breast Cancer STAT panel. The STAT Breast cancer panel offered by [facility] includes sequencing and rearrangement analysis for the following 9 genes:  ATM, BRCA1, BRCA2, CDH1, CHEK2, PALB2, PTEN, STK11 and TP53. The report date is 01/02/2019.   01/17/2019 Surgery   Right lumpectomy (Marlou Starks: IDC with DCIS, 1.2cm, grade 1, clear margins, 2 axillary lymph nodes negative.   01/17/2019 Cancer Staging   Staging form: Breast, AJCC 8th Edition - Pathologic stage from 01/17/2019: Stage IA (pT1c, pN0, cM0, G1, ER+, PR+, HER2-, Oncotype DX score: 24) - Signed by CGardenia Phlegm NP on 05/27/2019   01/31/2019 Oncotype testing   Score of 24 with 10% chance of distant recurrence in 9 years without systemic treatment.    02/15/2019 - 03/15/2019 Radiation Therapy   Adjuvant radiation treatment   03/2019 -  Anti-estrogen oral therapy   Anastrozole   03/13/2022 Genetic Testing   Updated testing: Negative genetic testing on [facility] Multi-Cancer +RNA Panel.  Report date is  03/13/2022.   The Multi-Cancer + RNA Panel offered by [facility] includes sequencing and/or deletion/duplication analysis of the following 84 genes:  AIP*, ALK, APC*, ATM*, AXIN2*, BAP1*, BARD1*, BLM*, BMPR1A*, BRCA1*, BRCA2*, BRIP1*, CASR, CDC73*, CDH1*, CDK4, CDKN1B*, CDKN1C*, CDKN2A, CEBPA, CHEK2*, CTNNA1*, DICER1*, DIS3L2*, EGFR, EPCAM, FH*, FLCN*, GATA2*, GPC3, GREM1, HOXB13, HRAS, KIT, MAX*, MEN1*, MET, MITF, MLH1*, MSH2*, MSH3*, MSH6*, MUTYH*, NBN*, NF1*, NF2*, NTHL1*, PALB2*, PDGFRA, PHOX2B, PMS2*, POLD1*, POLE*, POT1*, PRKAR1A*, PTCH1*, PTEN*, RAD50*, RAD51C*, RAD51D*, RB1*, RECQL4, RET, RUNX1*, SDHA*, SDHAF2*, SDHB*, SDHC*, SDHD*, SMAD4*, SMARCA4*, SMARCB1*, SMARCE1*, STK11*, SUFU*, TERC, TERT, TMEM127*, Tp53*, TSC1*, TSC2*, VHL*, WRN*, and WT1.  RNA analysis is performed for * genes.     CHIEF COMPLIANT: Follow-up of right breast cancer on anastrozole    INTERVAL HISTORY: [place] MELOUISE DIVELBISSis a  51y.o. with above-mentioned history of right breast cancer who underwent a lumpectomy, radiation, and is currently on antiestrogen therapy with anastrozole. Mammogram on 12/10/2020 showed no evidence of malignancy bilaterally. She presents to the clinic today for follow-up.    ALLERGIES:  is allergic to azithromycin, penicillins, and nsaids.  MEDICATIONS:  Current Outpatient Medications  Medication Sig Dispense Refill   anastrozole (ARIMIDEX) 1 MG tablet Take 1 tablet (1 mg total) by mouth daily. (Patient taking differently: Take 1 mg by mouth at bedtime.) 90 tablet 3   Cholecalciferol (VITAMIN D3) 50 MCG (2000 UT) TABS Take by mouth daily.     COLLAGEN PO Take by mouth daily. POWDER     Cyanocobalamin (VITAMIN B 12 PO) Take by mouth daily at 12 noon.     Omega-3 Fatty  Acids (FISH OIL) 1200 MG CAPS Take 1 capsule by mouth daily.     No current facility-administered medications for this visit.    PHYSICAL EXAMINATION: ECOG PERFORMANCE STATUS: {CHL ONC ECOG PS:[id number]}  There were no vitals  filed for this visit. There were no vitals filed for this visit.  BREAST:*** No palpable masses or nodules in either right or left breasts. No palpable axillary supraclavicular or infraclavicular adenopathy no breast tenderness or nipple discharge. (exam performed in the presence of a chaperone)  LABORATORY DATA:  I have reviewed the data as listed    Latest Ref Rng & Units 01/24/2022    2:18 PM 12/19/2018   12:19 PM  CMP  Glucose 70 - 99 mg/dL 85  93   BUN 6 - 20 mg/dL 17  10   Creatinine 0.44 - 1.00 mg/dL 0.68  0.80   Sodium 135 - 145 mmol/L 139  139   Potassium 3.5 - 5.1 mmol/L 4.3  3.6   Chloride 98 - 111 mmol/L 108  106   CO2 22 - 32 mmol/L 25  23   Calcium 8.9 - 10.3 mg/dL 9.5  8.9   Total Protein 6.5 - 8.1 g/dL  7.3   Total Bilirubin 0.3 - 1.2 mg/dL  0.4   Alkaline Phos 38 - 126 U/L  34   AST 15 - 41 U/L  20   ALT 0 - 44 U/L  32     Lab Results  Component Value Date   WBC 4.9 01/24/2022   HGB 14.6 01/24/2022   HCT 43.3 01/24/2022   MCV 87.5 01/24/2022   PLT 225 01/24/2022   NEUTROABS 2.8 01/24/2022    ASSESSMENT & PLAN:  No problem-specific Assessment & Plan notes found for this encounter.    No orders of the defined types were placed in this encounter.  The patient has a good understanding of the overall plan. she agrees with it. she will call with any problems that may develop before the next visit here. Total time spent: 30 mins including face to face time and time spent for planning, charting and co-ordination of care   Suzzette Righter, [place] 04/30/22    I Gardiner Coins am scribing for Dr. Lindi Adie  ***

## 2022-05-02 ENCOUNTER — Other Ambulatory Visit: Payer: Self-pay

## 2022-05-02 ENCOUNTER — Inpatient Hospital Stay: Payer: BC Managed Care – PPO | Attending: Hematology and Oncology | Admitting: Hematology and Oncology

## 2022-05-02 ENCOUNTER — Other Ambulatory Visit: Payer: Self-pay | Admitting: Hematology and Oncology

## 2022-05-02 VITALS — BP 126/85 | HR 74 | Temp 97.7°F | Resp 18 | Ht 66.0 in | Wt 147.1 lb

## 2022-05-02 DIAGNOSIS — Z780 Asymptomatic menopausal state: Secondary | ICD-10-CM | POA: Diagnosis not present

## 2022-05-02 DIAGNOSIS — Z79811 Long term (current) use of aromatase inhibitors: Secondary | ICD-10-CM | POA: Diagnosis not present

## 2022-05-02 DIAGNOSIS — Z923 Personal history of irradiation: Secondary | ICD-10-CM | POA: Diagnosis not present

## 2022-05-02 DIAGNOSIS — Z853 Personal history of malignant neoplasm of breast: Secondary | ICD-10-CM

## 2022-05-02 DIAGNOSIS — Z170 Estrogen receptor positive status [ER+]: Secondary | ICD-10-CM | POA: Diagnosis not present

## 2022-05-02 DIAGNOSIS — Z90722 Acquired absence of ovaries, bilateral: Secondary | ICD-10-CM | POA: Diagnosis not present

## 2022-05-02 DIAGNOSIS — C50211 Malignant neoplasm of upper-inner quadrant of right female breast: Secondary | ICD-10-CM | POA: Diagnosis not present

## 2022-05-02 DIAGNOSIS — Z90710 Acquired absence of both cervix and uterus: Secondary | ICD-10-CM | POA: Insufficient documentation

## 2022-05-02 DIAGNOSIS — Z87891 Personal history of nicotine dependence: Secondary | ICD-10-CM | POA: Diagnosis not present

## 2022-05-02 NOTE — Assessment & Plan Note (Addendum)
12/12/2018:Screening mammogram detected a distortion in the right breast, measuring 78m on diagnostic mammogram and [place] Biopsy confirmed grade 1 IDC with DCIS, HER-2 negative by FISH, ER 90%, PR 90%, Ki67 10%.   01/17/19: Rt Lumpectomy IDC with DCIS, 1.2cm, grade 1, clear margins, 2 axillary lymph nodes negative.HER-2 negative by FISH, ER 90%, PR 90%, Ki67 10%.   Oncotype DX score 24: 10% ROR Adjuvant radiation therapy 02/07/2019-03/15/2011   Current treatment: Antiestrogen therapy with anastrozole started October 2020.  This was switched to tamoxifen February 2021 , switched to anastrozole May 2022     Anastrozole toxicities: Denies any hot flashes or arthralgias. Intense right leg pain that lasted for 1 to 2 days.  Unclear etiology it has resolved.   Breast cancer surveillance: 1.  Breast exam 05/02/2022: Benign 2. mammogram 12/13/2021: Benign breast density category C, palpable area right breast: Mammogram, ultrasound (6 mm hypoechoic mass) and biopsy 03/16/2022: Fat necrosis at the site of lumpectomy Bone density 11/25/2020: T score -1: Normal CT abdomen pelvis 03/18/2022: Negative 3.  Abbreviated breast MRIs being done annually in December.  I ordered it.  Return to clinic in 1 year for follow-up

## 2022-05-04 ENCOUNTER — Ambulatory Visit: Payer: BC Managed Care – PPO | Admitting: Hematology and Oncology

## 2022-05-06 ENCOUNTER — Ambulatory Visit: Payer: BC Managed Care – PPO | Admitting: Hematology and Oncology

## 2022-06-08 ENCOUNTER — Ambulatory Visit
Admission: RE | Admit: 2022-06-08 | Discharge: 2022-06-08 | Disposition: A | Discharge status: Home / Self Care | Payer: BC Managed Care – PPO | Source: Ambulatory Visit | Attending: Hematology and Oncology | Admitting: Hematology and Oncology

## 2022-06-08 DIAGNOSIS — Z170 Estrogen receptor positive status [ER+]: Secondary | ICD-10-CM

## 2022-06-08 MED ORDER — GADOPICLENOL 0.5 MMOL/ML IV SOLN
7.0000 mL | Freq: Once | INTRAVENOUS | Status: AC | PRN
  Administered 2022-06-08: 7 mL via INTRAVENOUS

## 2022-06-09 ENCOUNTER — Telehealth: Payer: Self-pay | Admitting: Hematology and Oncology

## 2022-06-09 NOTE — Telephone Encounter (Signed)
I informed the patient that the breast MRI was normal.

## 2022-08-09 ENCOUNTER — Other Ambulatory Visit: Payer: Self-pay | Admitting: Hematology and Oncology

## 2022-08-09 DIAGNOSIS — C50211 Malignant neoplasm of upper-inner quadrant of right female breast: Secondary | ICD-10-CM

## 2022-09-01 ENCOUNTER — Encounter: Payer: Self-pay | Admitting: Psychiatry

## 2022-09-01 NOTE — Telephone Encounter (Signed)
Looks like she need a labs, scan and MD this month, correct?   Sharyn Lull

## 2022-09-05 ENCOUNTER — Telehealth: Payer: Self-pay | Admitting: *Deleted

## 2022-09-05 ENCOUNTER — Other Ambulatory Visit: Payer: Self-pay | Admitting: Gynecologic Oncology

## 2022-09-05 DIAGNOSIS — D3912 Neoplasm of uncertain behavior of left ovary: Secondary | ICD-10-CM

## 2022-09-05 NOTE — Progress Notes (Signed)
Pt Dr. Ernestina Patches, plan for inhibin A/B. No scan at this time unless symptomatic.

## 2022-09-05 NOTE — Telephone Encounter (Signed)
Late entry Wrong MD entered-----patient is scheduled with Dr Ernestina Patches

## 2022-09-05 NOTE — Telephone Encounter (Signed)
Yes, with you

## 2022-09-05 NOTE — Telephone Encounter (Signed)
Spoke with the patient and scheduled a MD visit with Dr Berline Lopes. Patient will go to [facility] for the lab work

## 2022-09-13 ENCOUNTER — Inpatient Hospital Stay: Payer: BC Managed Care – PPO | Attending: Psychiatry

## 2022-09-13 DIAGNOSIS — C50211 Malignant neoplasm of upper-inner quadrant of right female breast: Secondary | ICD-10-CM | POA: Insufficient documentation

## 2022-09-13 DIAGNOSIS — N958 Other specified menopausal and perimenopausal disorders: Secondary | ICD-10-CM | POA: Insufficient documentation

## 2022-09-13 DIAGNOSIS — D3912 Neoplasm of uncertain behavior of left ovary: Secondary | ICD-10-CM

## 2022-09-13 DIAGNOSIS — C562 Malignant neoplasm of left ovary: Secondary | ICD-10-CM | POA: Diagnosis not present

## 2022-09-13 DIAGNOSIS — Z170 Estrogen receptor positive status [ER+]: Secondary | ICD-10-CM | POA: Diagnosis not present

## 2022-09-13 DIAGNOSIS — Z79811 Long term (current) use of aromatase inhibitors: Secondary | ICD-10-CM | POA: Diagnosis not present

## 2022-09-15 ENCOUNTER — Encounter: Payer: Self-pay | Admitting: Psychiatry

## 2022-09-15 LAB — INHIBIN A: Inhibin-A: 0.4 pg/mL

## 2022-09-19 ENCOUNTER — Inpatient Hospital Stay: Payer: BC Managed Care – PPO | Attending: Psychiatry | Admitting: Psychiatry

## 2022-09-19 ENCOUNTER — Other Ambulatory Visit: Payer: Self-pay

## 2022-09-19 VITALS — BP 110/78 | HR 81 | Temp 98.6°F | Resp 16 | Ht 66.0 in | Wt 145.2 lb

## 2022-09-19 DIAGNOSIS — Z86018 Personal history of other benign neoplasm: Secondary | ICD-10-CM | POA: Diagnosis not present

## 2022-09-19 DIAGNOSIS — Z90710 Acquired absence of both cervix and uterus: Secondary | ICD-10-CM | POA: Insufficient documentation

## 2022-09-19 DIAGNOSIS — R350 Frequency of micturition: Secondary | ICD-10-CM | POA: Insufficient documentation

## 2022-09-19 DIAGNOSIS — Z170 Estrogen receptor positive status [ER+]: Secondary | ICD-10-CM | POA: Diagnosis not present

## 2022-09-19 DIAGNOSIS — Z923 Personal history of irradiation: Secondary | ICD-10-CM | POA: Insufficient documentation

## 2022-09-19 DIAGNOSIS — Z90722 Acquired absence of ovaries, bilateral: Secondary | ICD-10-CM | POA: Diagnosis not present

## 2022-09-19 DIAGNOSIS — N958 Other specified menopausal and perimenopausal disorders: Secondary | ICD-10-CM | POA: Diagnosis not present

## 2022-09-19 DIAGNOSIS — C562 Malignant neoplasm of left ovary: Secondary | ICD-10-CM

## 2022-09-19 DIAGNOSIS — N951 Menopausal and female climacteric states: Secondary | ICD-10-CM | POA: Insufficient documentation

## 2022-09-19 DIAGNOSIS — C50211 Malignant neoplasm of upper-inner quadrant of right female breast: Secondary | ICD-10-CM | POA: Insufficient documentation

## 2022-09-19 DIAGNOSIS — D3912 Neoplasm of uncertain behavior of left ovary: Secondary | ICD-10-CM

## 2022-09-19 DIAGNOSIS — Z79811 Long term (current) use of aromatase inhibitors: Secondary | ICD-10-CM | POA: Insufficient documentation

## 2022-09-19 LAB — INHIBIN B: Inhibin B: <7 pg/mL

## 2022-09-19 NOTE — Progress Notes (Unsigned)
Gynecologic Oncology Return Clinic Visit  Date of Service: 09/19/2022 Referring Provider: Verdell Carmine., MD [address] [place],  [place] [address]***  Assessment & Plan: Debbie Berry is a 52 y.o. woman with Stage *** who presents for ***.  *** Inhibin B pending Coconut oil Non hormonal meds with obgyn to discuss CT a/p   ***VTE Prophylaxis: - Khorana score = ***  ***Preventative Screening: {Preventative screening:28493}  RTC ***.  [name], MD Gynecologic Oncology   Medical Decision Making I personally spent  TOTAL *** minutes face-to-face and non-face-to-face in the care of this patient, which includes all pre, intra, and post visit time on the date of service.  *** minutes spent reviewing records prior to the visit *** Minutes in patient contact      *** minutes in other billable services *** minutes charting , conferring with consultants etc.   ----------------------- Reason for Visit: ***  Treatment History: Oncology History  Malignant neoplasm of upper-inner quadrant of right breast in female, estrogen receptor positive  12/12/2018 Initial Diagnosis   Screening mammogram detected a distortion in the right breast, measuring 19mm on diagnostic mammogram and [place]. Biopsy confirmed grade 1 IDC with DCIS, HER-2 negative by FISH, ER 90%, PR 90%, Ki67 10%.    12/19/2018 Cancer Staging   Staging form: Breast, AJCC 8th Edition - Clinical stage from 12/19/2018: Stage IA (cT1b, cN0, cM0, G1, ER+, PR+, HER2-) - Signed by Nicholas Lose, MD on 12/19/2018   01/02/2019 Genetic Testing   Negative genetic testing on the [facility] Breast Cancer STAT panel. The STAT Breast cancer panel offered by [facility] includes sequencing and rearrangement analysis for the following 9 genes:  ATM, BRCA1, BRCA2, CDH1, CHEK2, PALB2, PTEN, STK11 and TP53. The report date is 01/02/2019.   01/17/2019 Surgery   Right lumpectomy Marlou Starks): IDC with DCIS, 1.2cm, grade 1, clear margins, 2 axillary lymph  nodes negative.   01/17/2019 Cancer Staging   Staging form: Breast, AJCC 8th Edition - Pathologic stage from 01/17/2019: Stage IA (pT1c, pN0, cM0, G1, ER+, PR+, HER2-, Oncotype DX score: 24) - Signed by Gardenia Phlegm, NP on 05/27/2019   01/31/2019 Oncotype testing   Score of 24 with 10% chance of distant recurrence in 9 years without systemic treatment.    02/15/2019 - 03/15/2019 Radiation Therapy   Adjuvant radiation treatment   03/2019 -  Anti-estrogen oral therapy   Anastrozole   03/13/2022 Genetic Testing   Updated testing: Negative genetic testing on [facility] Multi-Cancer +RNA Panel.  Report date is 03/13/2022.   The Multi-Cancer + RNA Panel offered by [facility] includes sequencing and/or deletion/duplication analysis of the following 84 genes:  AIP*, ALK, APC*, ATM*, AXIN2*, BAP1*, BARD1*, BLM*, BMPR1A*, BRCA1*, BRCA2*, BRIP1*, CASR, CDC73*, CDH1*, CDK4, CDKN1B*, CDKN1C*, CDKN2A, CEBPA, CHEK2*, CTNNA1*, DICER1*, DIS3L2*, EGFR, EPCAM, FH*, FLCN*, GATA2*, GPC3, GREM1, HOXB13, HRAS, KIT, MAX*, MEN1*, MET, MITF, MLH1*, MSH2*, MSH3*, MSH6*, MUTYH*, NBN*, NF1*, NF2*, NTHL1*, PALB2*, PDGFRA, PHOX2B, PMS2*, POLD1*, POLE*, POT1*, PRKAR1A*, PTCH1*, PTEN*, RAD50*, RAD51C*, RAD51D*, RB1*, RECQL4, RET, RUNX1*, SDHA*, SDHAF2*, SDHB*, SDHC*, SDHD*, SMAD4*, SMARCA4*, SMARCB1*, SMARCE1*, STK11*, SUFU*, TERC, TERT, TMEM127*, Tp53*, TSC1*, TSC2*, VHL*, WRN*, and WT1.  RNA analysis is performed for * genes.     Interval History: ***  Constipation off/on last few months Urinary frequency at night few months More gas Some vaginal bleeding with intercourse, no lubrication Recently more night sweats Some nausea the comes and goes a few weeks ago  Colonoscopy 2020 - planned 10 year follow-up.  Past Medical/Surgical History: Past Medical History:  Diagnosis Date   Complication of anesthesia    Patient stated that after her surgery at [facility] in March 2023, she experienced nausae.   Family history  of cervical cancer    History of COVID-19 04/2021   per pt mild symptoms that resolved   History of external beam radiation therapy    02-15-2019  to 03-15-2019  right breast cancer   Hyperlipidemia    Malignant neoplasm of upper-inner quadrant of right breast in female, estrogen receptor positive ([place]) 11/2018   oncologist--- dr Lindi Adie;  dx 06/ 2020,  IDC , Stage IA, ER/ PR +;  completed radiation 03-15-2019   Migraine    PMB (postmenopausal bleeding)    PONV (postoperative nausea and vomiting)    Vertigo    Wears contact lenses     Past Surgical History:  Procedure Laterality Date   BREAST BIOPSY Right 11/2018   BREAST LUMPECTOMY WITH RADIOACTIVE SEED AND SENTINEL LYMPH NODE BIOPSY Right 01/17/2019   Procedure: RIGHT BREAST LUMPECTOMY WITH RADIOACTIVE SEED AND SENTINEL LYMPH NODE BIOPSY;  Surgeon: Jovita Kussmaul, MD;  Location: [place];  Service: General;  Laterality: Right;   CHOLECYSTECTOMY, LAPAROSCOPIC  2013   COLONOSCOPY  02/2020   CYSTOSCOPY N/A 01/26/2022   Procedure: CYSTOSCOPY;  Surgeon: Rowland Lathe, MD;  Location: [place];  Service: Gynecology;  Laterality: N/A;   DILATATION & CURETTAGE/HYSTEROSCOPY WITH MYOSURE N/A 09/15/2021   Procedure: DILATATION & CURETTAGE/HYSTEROSCOPY;  Surgeon: Rowland Lathe, MD;  Location: [place];  Service: Gynecology;  Laterality: N/A;   INCISIONAL HERNIA REPAIR  2015   POLYPECTOMY N/A 09/15/2021   Procedure: POLYPECTOMY WITH MYOSURE;  Surgeon: Rowland Lathe, MD;  Location: [place];  Service: Gynecology;  Laterality: N/A;   TOTAL LAPAROSCOPIC HYSTERECTOMY WITH SALPINGECTOMY N/A 01/26/2022   Procedure: TOTAL LAPAROSCOPIC HYSTERECTOMY WITH SALPINGO-OOPHORECTOMY;  Surgeon: Rowland Lathe, MD;  Location: [place];  Service: Gynecology;  Laterality: N/A;    Family History  Problem Relation Age of Onset   Colon polyps Mother     Cervical cancer Paternal Grandmother    Cervical cancer Paternal Aunt 65   Colon cancer Neg Hx    Esophageal cancer Neg Hx    Rectal cancer Neg Hx    Stomach cancer Neg Hx     Social History   Socioeconomic History   Marital status: Married    Spouse name: Not on file   Number of children: Not on file   Years of education: Not on file   Highest education level: Not on file  Occupational History   Not on file  Tobacco Use   Smoking status: Former    Years: .5    Types: Cigarettes    Quit date: 1990    Years since quitting: 34.2   Smokeless tobacco: Never  Vaping Use   Vaping Use: Never used  Substance and Sexual Activity   Alcohol use: Yes    Comment: rare   Drug use: Never   Sexual activity: Not on file  Other Topics Concern   Not on file  Social History Narrative   Not on file   Social Determinants of Health   Financial Resource Strain: Not on file  Food Insecurity: Not on file  Transportation Needs: No Transportation Needs (02/06/2019)   PRAPARE - Hydrologist (Medical): No    Lack of Transportation (Non-Medical): No  Physical Activity: Not on file  Stress: Not on file  Social Connections: Not on file    Current Medications:  Current Outpatient Medications:    anastrozole (ARIMIDEX) 1 MG tablet, TAKE 1 TABLET BY MOUTH EVERY DAY, Disp: 90 tablet, Rfl: 3   Cholecalciferol (VITAMIN D3) 50 MCG (2000 UT) TABS, Take by mouth daily., Disp: , Rfl:    COLLAGEN PO, Take by mouth daily. POWDER, Disp: , Rfl:    Cyanocobalamin (VITAMIN B 12 PO), Take by mouth daily at 12 noon., Disp: , Rfl:    Omega-3 Fatty Acids (FISH OIL) 1200 MG CAPS, Take 1 capsule by mouth daily., Disp: , Rfl:   Review of Symptoms: Complete 10-system review is positive for joint pain, dyspareunia  Physical Exam: BP 110/78 (BP Location: Left Arm, Patient Position: Sitting)   Pulse 81   Temp 98.6 F (37 C) (Oral)   Resp 16   Ht 5\' 6"  (1.676 m)   Wt 145 lb 3.2 oz  (65.9 kg)   LMP 12/19/2018 (Exact Date) Comment: Patient stopped having periods due to taking Arimidex per pt.  SpO2 99%   BMI 23.44 kg/m  General: Alert, oriented, no acute distress. HEENT: Normocephalic, atraumatic. Neck symmetric without masses. Sclera anicteric.  Chest: Normal work of breathing. Clear to auscultation bilaterally.   Cardiovascular: Regular rate and rhythm, no murmurs. Abdomen: Soft, nontender.  Normoactive bowel sounds.  No masses or hepatosplenomegaly appreciated.  Well-healed incisions. Extremities: Grossly normal range of motion.  Warm, well perfused.  No edema bilaterally. Skin: No rashes or lesions noted. Lymphatics: No cervical, supraclavicular, or inguinal adenopathy. GU: Normal appearing external genitalia without erythema, excoriation, or lesions.  Speculum exam reveals normal cuff without lesions.  Bimanual exam reveals smooth cuff. No nodularity. No pelvic mass.  Rectovaginal exam negative. Exam chaperoned by Kimberly Martinique, [place]   Laboratory & Radiologic Studies: ***

## 2022-09-19 NOTE — Patient Instructions (Signed)
It was a pleasure to see you in clinic today. - I will let you know when the inhibin returns. - Imaging given your constipation, urinary frequency. - Return visit planned for 6 months unless needed sooner.  Thank you very much for allowing me to provide care for you today.  I appreciate your confidence in choosing our Gynecologic Oncology team at [facility].  If you have any questions about your visit today please call our office or send [place] a MyChart message and we will get back to you as soon as possible.

## 2022-09-21 ENCOUNTER — Encounter: Payer: Self-pay | Admitting: Psychiatry

## 2022-09-21 DIAGNOSIS — C562 Malignant neoplasm of left ovary: Secondary | ICD-10-CM | POA: Insufficient documentation

## 2022-10-19 ENCOUNTER — Ambulatory Visit
Admission: RE | Admit: 2022-10-19 | Discharge: 2022-10-19 | Disposition: A | Discharge status: Home / Self Care | Payer: BC Managed Care – PPO | Source: Ambulatory Visit | Attending: Psychiatry | Admitting: Psychiatry

## 2022-10-19 DIAGNOSIS — C562 Malignant neoplasm of left ovary: Secondary | ICD-10-CM

## 2022-10-19 MED ORDER — IOPAMIDOL (ISOVUE-300) INJECTION 61%
100.0000 mL | Freq: Once | INTRAVENOUS | Status: AC | PRN
  Administered 2022-10-19: 100 mL via INTRAVENOUS

## 2022-10-21 ENCOUNTER — Encounter: Payer: Self-pay | Admitting: Psychiatry

## 2022-10-24 ENCOUNTER — Telehealth: Payer: Self-pay | Admitting: Psychiatry

## 2022-10-24 ENCOUNTER — Telehealth: Payer: Self-pay | Admitting: *Deleted

## 2022-10-24 ENCOUNTER — Encounter: Payer: Self-pay | Admitting: Psychiatry

## 2022-10-24 NOTE — Telephone Encounter (Signed)
Called pt regarding her CT scan results. Normal. Pt can follow-up in 6 months from prior visit. Labs prior to that visit. If continues to do well, maybe repeat CT scan in 1 year.

## 2022-10-24 NOTE — Telephone Encounter (Signed)
Patient notified in regards to Warner Mccreedy, NP note below.  Pt informed that Dr. Alvester Morin is in clinic today and she will review the results and let the pt know.  Relayed following message to patient:  the scan has NO findings suggestive of cancer return or spread. Good News! They note that pt has benign cysts in the left kidney (no follow up needed for these).  Pt has no further concerns or questions at this time.

## 2022-10-24 NOTE — Telephone Encounter (Signed)
-----   Message from Doylene Bode, NP sent at 10/24/2022 11:48 AM EDT ----- Please let the patient know:  Dr. Alvester Morin is in clinic today. She will review the results and let the pt know.   The scan has NO findings suggestive of cancer return or spread. Good News! They note that she has benign cysts in the left kidney (no follow up needed for these).

## 2022-10-24 NOTE — Telephone Encounter (Signed)
Per Warner Mccreedy, NP I contacted [facility] and spoke with Elnita Maxwell and she entered a radiologist addendum to have the radiation therapy removed from the CT Chest abdomen report.

## 2022-10-25 ENCOUNTER — Other Ambulatory Visit: Payer: Self-pay | Admitting: Psychiatry

## 2022-10-25 DIAGNOSIS — C562 Malignant neoplasm of left ovary: Secondary | ICD-10-CM

## 2022-11-28 ENCOUNTER — Telehealth: Payer: Self-pay

## 2022-11-28 ENCOUNTER — Ambulatory Visit
Admission: RE | Admit: 2022-11-28 | Discharge: 2022-11-28 | Disposition: A | Discharge status: Home / Self Care | Payer: BC Managed Care – PPO | Source: Ambulatory Visit | Attending: Hematology and Oncology | Admitting: Hematology and Oncology

## 2022-11-28 DIAGNOSIS — Z780 Asymptomatic menopausal state: Secondary | ICD-10-CM

## 2022-11-28 NOTE — Telephone Encounter (Signed)
Called pt. Per Np to give her the results of her bone density and when she should repeat it. Pt was verbalized understanding.

## 2022-11-28 NOTE — Telephone Encounter (Signed)
-----   Message from Loa Socks, NP sent at 11/28/2022 12:45 PM EDT ----- Please let patient know that her bone density shows osteopenia that is mild.  I recommend repeat testing in 2 years time.  ----- Message ----- From: Interface, Rad Results In Sent: 11/28/2022  11:06 AM EDT To: Serena Croissant, MD

## 2022-12-15 ENCOUNTER — Ambulatory Visit
Admission: RE | Admit: 2022-12-15 | Discharge: 2022-12-15 | Disposition: A | Discharge status: Home / Self Care | Payer: BC Managed Care – PPO | Source: Ambulatory Visit | Attending: Hematology and Oncology | Admitting: Hematology and Oncology

## 2022-12-15 DIAGNOSIS — Z853 Personal history of malignant neoplasm of breast: Secondary | ICD-10-CM

## 2023-03-01 ENCOUNTER — Encounter: Payer: Self-pay | Admitting: Obstetrics & Gynecology

## 2023-04-06 ENCOUNTER — Inpatient Hospital Stay: Payer: BC Managed Care – PPO | Attending: Hematology and Oncology

## 2023-04-06 DIAGNOSIS — Z79811 Long term (current) use of aromatase inhibitors: Secondary | ICD-10-CM | POA: Diagnosis not present

## 2023-04-06 DIAGNOSIS — Z1732 Human epidermal growth factor receptor 2 negative status: Secondary | ICD-10-CM | POA: Diagnosis not present

## 2023-04-06 DIAGNOSIS — Z923 Personal history of irradiation: Secondary | ICD-10-CM | POA: Diagnosis not present

## 2023-04-06 DIAGNOSIS — N941 Unspecified dyspareunia: Secondary | ICD-10-CM | POA: Diagnosis not present

## 2023-04-06 DIAGNOSIS — Z170 Estrogen receptor positive status [ER+]: Secondary | ICD-10-CM | POA: Insufficient documentation

## 2023-04-06 DIAGNOSIS — N958 Other specified menopausal and perimenopausal disorders: Secondary | ICD-10-CM | POA: Insufficient documentation

## 2023-04-06 DIAGNOSIS — S30814A Abrasion of vagina and vulva, initial encounter: Secondary | ICD-10-CM | POA: Diagnosis not present

## 2023-04-06 DIAGNOSIS — C562 Malignant neoplasm of left ovary: Secondary | ICD-10-CM

## 2023-04-06 DIAGNOSIS — Z90710 Acquired absence of both cervix and uterus: Secondary | ICD-10-CM | POA: Insufficient documentation

## 2023-04-06 DIAGNOSIS — Z90722 Acquired absence of ovaries, bilateral: Secondary | ICD-10-CM | POA: Insufficient documentation

## 2023-04-06 DIAGNOSIS — C50211 Malignant neoplasm of upper-inner quadrant of right female breast: Secondary | ICD-10-CM | POA: Insufficient documentation

## 2023-04-06 DIAGNOSIS — Z1721 Progesterone receptor positive status: Secondary | ICD-10-CM | POA: Diagnosis not present

## 2023-04-06 DIAGNOSIS — Z8543 Personal history of malignant neoplasm of ovary: Secondary | ICD-10-CM | POA: Insufficient documentation

## 2023-04-17 ENCOUNTER — Encounter: Payer: Self-pay | Admitting: Psychiatry

## 2023-04-17 ENCOUNTER — Inpatient Hospital Stay (HOSPITAL_BASED_OUTPATIENT_CLINIC_OR_DEPARTMENT_OTHER): Payer: BC Managed Care – PPO | Admitting: Psychiatry

## 2023-04-17 VITALS — BP 117/89 | HR 64 | Temp 98.2°F | Resp 20 | Wt 146.0 lb

## 2023-04-17 DIAGNOSIS — N958 Other specified menopausal and perimenopausal disorders: Secondary | ICD-10-CM | POA: Diagnosis not present

## 2023-04-17 DIAGNOSIS — S30814A Abrasion of vagina and vulva, initial encounter: Secondary | ICD-10-CM

## 2023-04-17 DIAGNOSIS — Z8543 Personal history of malignant neoplasm of ovary: Secondary | ICD-10-CM | POA: Diagnosis not present

## 2023-04-17 DIAGNOSIS — C562 Malignant neoplasm of left ovary: Secondary | ICD-10-CM

## 2023-04-17 MED ORDER — LIDOCAINE HCL 5 % EX GEL: 1.0000 | Freq: Every day | CUTANEOUS | 0 refills | Status: AC | PRN

## 2023-04-17 NOTE — Progress Notes (Signed)
Gynecologic Oncology Return Clinic Visit  Date of Service: 04/17/2023 Referring Provider: Derl Barrow, MD  Assessment & Plan: Debbie Berry is a 52 y.o. 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   Granulosa cell tumor: - NED on exam - Inhibin A /B negative from 04/06/23. - Signs/symptoms of recurrence reviewed. - Given that we do not know if inhibin is a marker for her, recommend repeat CT in 1 year (~10/2023). - Continue follow-up q55mo through 2 years then yearly.  Genitourinary syndrome of menopause : - Has not had improvement with topical treatments - Rx for topical lidocaine given posterior fourchette superficial tear - Given HR+ breast cancer and granulosa cell tumor, topical estrogen is not a great option for her.   RTC 74mo  Clide Cliff, MD Gynecologic Oncology   Medical Decision Making I personally spent  TOTAL 30 minutes face-to-face and non-face-to-face in the care of this patient, which includes all pre, intra, and post visit time on the date of service.  ----------------------- Reason for Visit: Surveillance  Treatment History: Oncology History  Malignant neoplasm of upper-inner quadrant of right breast in female, estrogen receptor positive ([facility])  12/12/2018 Initial Diagnosis   Screening mammogram detected a distortion in the right breast, measuring 8mm on diagnostic mammogram and [place]. Biopsy confirmed grade 1 IDC with DCIS, HER-2 negative by FISH, ER 90%, PR 90%, Ki67 10%.    12/19/2018 Cancer Staging   Staging form: Breast, AJCC 8th Edition - Clinical stage from 12/19/2018: Stage IA (cT1b, cN0, cM0, G1, ER+, PR+, HER2-) - Signed by Serena Croissant, MD on 12/19/2018   01/02/2019 Genetic Testing   Negative genetic testing on the [facility] Breast Cancer STAT panel. The STAT Breast cancer panel offered by [facility] includes sequencing and rearrangement analysis for  the following 9 genes:  ATM, BRCA1, BRCA2, CDH1, CHEK2, PALB2, PTEN, STK11 and TP53. The report date is 01/02/2019.   01/17/2019 Surgery   Right lumpectomy Carolynne Edouard): IDC with DCIS, 1.2cm, grade 1, clear margins, 2 axillary lymph nodes negative.   01/17/2019 Cancer Staging   Staging form: Breast, AJCC 8th Edition - Pathologic stage from 01/17/2019: Stage IA (pT1c, pN0, cM0, G1, ER+, PR+, HER2-, Oncotype DX score: 24) - Signed by Loa Socks, NP on 05/27/2019   01/31/2019 Oncotype testing   Score of 24 with 10% chance of distant recurrence in 9 years without systemic treatment.    02/15/2019 - 03/15/2019 Radiation Therapy   Adjuvant radiation treatment   03/2019 -  Anti-estrogen oral therapy   Anastrozole   03/13/2022 Genetic Testing   Updated testing: Negative genetic testing on [facility] Multi-Cancer +RNA Panel.  Report date is 03/13/2022.   The Multi-Cancer + RNA Panel offered by [facility] includes sequencing and/or deletion/duplication analysis of the following 84 genes:  AIP*, ALK, APC*, ATM*, AXIN2*, BAP1*, BARD1*, BLM*, BMPR1A*, BRCA1*, BRCA2*, BRIP1*, CASR, CDC73*, CDH1*, CDK4, CDKN1B*, CDKN1C*, CDKN2A, CEBPA, CHEK2*, CTNNA1*, DICER1*, DIS3L2*, EGFR, EPCAM, FH*, FLCN*, GATA2*, GPC3, GREM1, HOXB13, HRAS, KIT, MAX*, MEN1*, MET, MITF, MLH1*, MSH2*, MSH3*, MSH6*, MUTYH*, NBN*, NF1*, NF2*, NTHL1*, PALB2*, PDGFRA, PHOX2B, PMS2*, POLD1*, POLE*, POT1*, PRKAR1A*, PTCH1*, PTEN*, RAD50*, RAD51C*, RAD51D*, RB1*, RECQL4, RET, RUNX1*, SDHA*, SDHAF2*, SDHB*, SDHC*, SDHD*, SMAD4*, SMARCA4*, SMARCB1*, SMARCE1*, STK11*, SUFU*, TERC, TERT, TMEM127*, Tp53*, TSC1*, TSC2*, VHL*, WRN*, and WT1.  RNA analysis is performed for * genes.   Left ovarian granulosa cell tumor  01/26/2022 Surgery   TLH, BSO, cystoscopy for  simple endometrial hyperplasia   01/26/2022 Pathology Results      01/26/2022 Initial Diagnosis   Left ovarian granulosa cell tumor   03/08/2022 Tumor Marker   Postop tumor markers: Inhibin B  <7.0 Inhibin A <0.3   03/17/2022 Imaging   CT C/A/P: IMPRESSION: 1. No findings to suggest recurrent disease or metastatic disease in the chest, abdomen or pelvis. 2. Incidental findings, as above.     Interval History: Patient overall doing well.  However, she does note that she continues to have pain with intercourse and bleeding after intercourse that is limiting her comfort with having intercourse.  She has tried the coconut oil as well as some other samples that her OB/GYN provided to her.  She has also been using lubrication.  She does not feel that any of these have helped.  She does not have pelvic pain outside of intercourse.  She also notes some discharge that she thinks may be possibly due to the topical treatments she has been trying.  She denies change in bowel or bladder habits or weight loss otherwise. Hotflashes are not longer as bothersome.   Past Medical/Surgical History: Past Medical History:  Diagnosis Date   Complication of anesthesia    Patient stated that after her surgery at [facility] in March 2023, she experienced nausae.   Family history of cervical cancer    History of COVID-19 04/2021   per pt mild symptoms that resolved   History of external beam radiation therapy    02-15-2019  to 03-15-2019  right breast cancer   Hyperlipidemia    Malignant neoplasm of upper-inner quadrant of right breast in female, estrogen receptor positive ([facility]CC) 11/2018   oncologist--- dr Pamelia Hoit;  dx 06/ 2020,  IDC , Stage IA, ER/ PR +;  completed radiation 03-15-2019   Migraine    PMB (postmenopausal bleeding)    PONV (postoperative nausea and vomiting)    Vertigo    Wears contact lenses     Past Surgical History:  Procedure Laterality Date   BREAST BIOPSY Right 11/2018   BREAST LUMPECTOMY WITH RADIOACTIVE SEED AND SENTINEL LYMPH NODE BIOPSY Right 01/17/2019   Procedure: RIGHT BREAST LUMPECTOMY WITH RADIOACTIVE SEED AND SENTINEL LYMPH NODE BIOPSY;  Surgeon: Griselda Miner, MD;   Location: [facility];  Service: General;  Laterality: Right;   CHOLECYSTECTOMY, LAPAROSCOPIC  2013   COLONOSCOPY  02/2020   CYSTOSCOPY N/A 01/26/2022   Procedure: CYSTOSCOPY;  Surgeon: Charlett Nose, MD;  Location: [facility];  Service: Gynecology;  Laterality: N/A;   DILATATION & CURETTAGE/HYSTEROSCOPY WITH MYOSURE N/A 09/15/2021   Procedure: DILATATION & CURETTAGE/HYSTEROSCOPY;  Surgeon: Charlett Nose, MD;  Location: [facility];  Service: Gynecology;  Laterality: N/A;   INCISIONAL HERNIA REPAIR  2015   POLYPECTOMY N/A 09/15/2021   Procedure: POLYPECTOMY WITH MYOSURE;  Surgeon: Charlett Nose, MD;  Location: [facility];  Service: Gynecology;  Laterality: N/A;   TOTAL LAPAROSCOPIC HYSTERECTOMY WITH SALPINGECTOMY N/A 01/26/2022   Procedure: TOTAL LAPAROSCOPIC HYSTERECTOMY WITH SALPINGO-OOPHORECTOMY;  Surgeon: Charlett Nose, MD;  Location: [facility];  Service: Gynecology;  Laterality: N/A;    Family History  Problem Relation Age of Onset   Colon polyps Mother    Cervical cancer Paternal Grandmother    Cervical cancer Paternal Aunt 58   Colon cancer Neg Hx    Esophageal cancer Neg Hx    Rectal cancer Neg Hx    Stomach cancer Neg Hx  Social History   Socioeconomic History   Marital status: Married    Spouse name: Not on file   Number of children: Not on file   Years of education: Not on file   Highest education level: Not on file  Occupational History   Not on file  Tobacco Use   Smoking status: Former    Current packs/day: 0.00    Types: Cigarettes    Start date: 12/1987    Quit date: 63    Years since quitting: 34.8   Smokeless tobacco: Never  Vaping Use   Vaping status: Never Used  Substance and Sexual Activity   Alcohol use: Yes    Comment: rare   Drug use: Never   Sexual activity: Not on file  Other Topics Concern   Not on file  Social History Narrative   Not  on file   Social Determinants of Health   Financial Resource Strain: Low Risk  (08/30/2021)   [facility] prior to [facility]20/2022[facility] visits prior to 08/20/2022.   Overall Financial Resource Strain (CARDIA)    Difficulty of Paying Living Expenses: Not hard at all  Food Insecurity: No Food Insecurity (08/30/2021)   [facility] from [facility] prior to 08/20/2022., [facility] visits prior to 08/20/2022.   Hunger Vital Sign    Worried About Running Out of Food in the Last Year: Never true    Ran Out of Food in the Last Year: Never true  Transportation Needs: No Transportation Needs (08/30/2021)   [facility] prior to 08/20/2022., [facility] visits prior to 08/20/2022.   PRAPARE - Administrator, Civil Service (Medical): No    Lack of Transportation (Non-Medical): No  Physical Activity: Insufficiently Active (08/30/2021)   [facility] from [facility] visits prior to 08/20/2022., [facility] visits prior to 08/20/2022.   Exercise Vital Sign    Days of Exercise per Week: 2 days    Minutes of Exercise per Session: 20 min  Stress: Stress Concern Present (08/30/2021)   [facility] prior to 08/20/2022., [facility] visits prior to 08/20/2022.   [employer] of Occupational Health - Occupational Stress Questionnaire    Feeling of Stress : To some extent  Social Connections: Moderately Integrated (08/30/2021)   Received from [facility] visits prior to 08/20/2022., [facility] visits prior to 08/20/2022.    Social Advertising account executive [NHANES]    Frequency of Communication with Friends and Family: Twice a week    Frequency of Social Gatherings with Friends and Family: Once a week    Attends Religious Services: 1 to 4 times per year    Active Member of [employer] or Organizations: No    Attends Engineer, structural: Never    Marital Status: Married    Current Medications:  Current Outpatient Medications:    anastrozole (ARIMIDEX) 1 MG tablet, TAKE 1 TABLET BY MOUTH EVERY DAY, Disp: 90 tablet, Rfl: 3   Cholecalciferol (VITAMIN D3) 50 MCG (2000 UT) TABS, Take by mouth daily., Disp: , Rfl:    COLLAGEN PO, Take by mouth daily. POWDER,  Disp: , Rfl:    Cyanocobalamin (VITAMIN B 12 PO), Take by mouth daily at 12 noon., Disp: , Rfl:    Omega-3 Fatty Acids (FISH OIL) 1200 MG CAPS, Take 1 capsule by mouth daily., Disp: , Rfl:   Review of Symptoms: Complete 10-system review is positive for pain with intercourse, bleeding after intercourse, discharge  Physical Exam: LMP 12/19/2018 (Exact Date) Comment: Patient stopped having periods due to taking Arimidex per pt. General: Alert, oriented, no acute distress. HEENT: Normocephalic, atraumatic. Neck symmetric without masses. Sclera anicteric.  Chest: Normal work of breathing. Clear to auscultation bilaterally.   Cardiovascular: Regular rate and rhythm, no murmurs. Abdomen: Soft, nontender.  Normoactive bowel sounds.  No masses or hepatosplenomegaly appreciated.  Well-healed incisions. Extremities: Grossly normal range of motion.  Warm, well perfused.  No edema bilaterally. Skin: No rashes or lesions noted. Lymphatics: No cervical, supraclavicular, or inguinal adenopathy. GU: Normal appearing external genitalia without erythema, excoriation, or lesions.  Speculum exam reveals normal cuff without lesions.  Superficial mucosal cut at the posterior fourchette. Bimanual exam reveals smooth cuff. No nodularity. No pelvic mass. Exam chaperoned  by Kimberly [place], CMA    Laboratory & Radiologic Studies: Lab Results  Component Value Date   INHBB <7.0 04/06/2023   INHBB <7.0 09/13/2022   INHBB <7.0 03/08/2022  Inhibin A: 0.4 (09/13/22) Inhibin A: <0.3 (03/08/22) Inhibin A: <0.3 (04/06/23)

## 2023-04-17 NOTE — Patient Instructions (Signed)
It was a pleasure to see you in clinic today. - Try topical lidocaine near opening of vagina to help - Return visit planned for 6 months (labs, CT as well around that time).  Thank you very much for allowing me to provide care for you today.  I appreciate your confidence in choosing our Gynecologic Oncology team at [facility].  If you have any questions about your visit today please call our office or send [place] a MyChart message and we will get back to you as soon as possible.

## 2023-04-18 LAB — INHIBIN A: Inhibin-A: <0.3 pg/mL

## 2023-04-18 LAB — INHIBIN B: Inhibin B: <7 pg/mL

## 2023-05-03 ENCOUNTER — Inpatient Hospital Stay: Payer: BC Managed Care – PPO | Attending: Hematology and Oncology | Admitting: Hematology and Oncology

## 2023-05-03 VITALS — BP 124/90 | HR 72 | Temp 97.3°F | Resp 18 | Ht 66.0 in | Wt 146.3 lb

## 2023-05-03 DIAGNOSIS — Z90722 Acquired absence of ovaries, bilateral: Secondary | ICD-10-CM | POA: Diagnosis not present

## 2023-05-03 DIAGNOSIS — Z8543 Personal history of malignant neoplasm of ovary: Secondary | ICD-10-CM | POA: Diagnosis not present

## 2023-05-03 DIAGNOSIS — Z90710 Acquired absence of both cervix and uterus: Secondary | ICD-10-CM | POA: Diagnosis not present

## 2023-05-03 DIAGNOSIS — Z1732 Human epidermal growth factor receptor 2 negative status: Secondary | ICD-10-CM | POA: Diagnosis not present

## 2023-05-03 DIAGNOSIS — Z1721 Progesterone receptor positive status: Secondary | ICD-10-CM | POA: Insufficient documentation

## 2023-05-03 DIAGNOSIS — Z79811 Long term (current) use of aromatase inhibitors: Secondary | ICD-10-CM | POA: Diagnosis not present

## 2023-05-03 DIAGNOSIS — C50211 Malignant neoplasm of upper-inner quadrant of right female breast: Secondary | ICD-10-CM | POA: Insufficient documentation

## 2023-05-03 DIAGNOSIS — Z170 Estrogen receptor positive status [ER+]: Secondary | ICD-10-CM | POA: Insufficient documentation

## 2023-05-03 DIAGNOSIS — Z923 Personal history of irradiation: Secondary | ICD-10-CM | POA: Insufficient documentation

## 2023-05-03 NOTE — Progress Notes (Signed)
Patient Care Team: Spry, Geroge Baseman., MD as PCP - General (Family Medicine) Griselda Miner, MD as Consulting Physician (General Surgery) Serena Croissant, MD as Consulting Physician (Hematology and Oncology) Lonie Peak, MD as Attending Physician (Radiation Oncology)  DIAGNOSIS:  Encounter Diagnosis  Name Primary?   Malignant neoplasm of upper-inner quadrant of right breast in female, estrogen receptor positive (HCC) Yes    SUMMARY OF ONCOLOGIC HISTORY: Oncology History  Malignant neoplasm of upper-inner quadrant of right breast in female, estrogen receptor positive (HCC)  12/12/2018 Initial Diagnosis   Screening mammogram detected a distortion in the right breast, measuring 8mm on diagnostic mammogram and [place]. Biopsy confirmed grade 1 IDC with DCIS, HER-2 negative by FISH, ER 90%, PR 90%, Ki67 10%.    12/19/2018 Cancer Staging   Staging form: Breast, AJCC 8th Edition - Clinical stage from 12/19/2018: Stage IA (cT1b, cN0, cM0, G1, ER+, PR+, HER2-) - Signed by Serena Croissant, MD on 12/19/2018   01/02/2019 Genetic Testing   Negative genetic testing on the [facility] Breast Cancer STAT panel. The STAT Breast cancer panel offered by [facility] includes sequencing and rearrangement analysis for the following 9 genes:  ATM, BRCA1, BRCA2, CDH1, CHEK2, PALB2, PTEN, STK11 and TP53. The report date is 01/02/2019.   01/17/2019 Surgery   Right lumpectomy Carolynne Edouard): IDC with DCIS, 1.2cm, grade 1, clear margins, 2 axillary lymph nodes negative.   01/17/2019 Cancer Staging   Staging form: Breast, AJCC 8th Edition - Pathologic stage from 01/17/2019: Stage IA (pT1c, pN0, cM0, G1, ER+, PR+, HER2-, Oncotype DX score: 24) - Signed by Loa Socks, NP on 05/27/2019   01/31/2019 Oncotype testing   Score of 24 with 10% chance of distant recurrence in 9 years without systemic treatment.    02/15/2019 - 03/15/2019 Radiation Therapy   Adjuvant radiation treatment   03/2019 -  Anti-estrogen oral therapy    Anastrozole   03/13/2022 Genetic Testing   Updated testing: Negative genetic testing on [facility] Multi-Cancer +RNA Panel.  Report date is 03/13/2022.   The Multi-Cancer + RNA Panel offered by [facility] includes sequencing and/or deletion/duplication analysis of the following 84 genes:  AIP*, ALK, APC*, ATM*, AXIN2*, BAP1*, BARD1*, BLM*, BMPR1A*, BRCA1*, BRCA2*, BRIP1*, CASR, CDC73*, CDH1*, CDK4, CDKN1B*, CDKN1C*, CDKN2A, CEBPA, CHEK2*, CTNNA1*, DICER1*, DIS3L2*, EGFR, EPCAM, FH*, FLCN*, GATA2*, GPC3, GREM1, HOXB13, HRAS, KIT, MAX*, MEN1*, MET, MITF, MLH1*, MSH2*, MSH3*, MSH6*, MUTYH*, NBN*, NF1*, NF2*, NTHL1*, PALB2*, PDGFRA, PHOX2B, PMS2*, POLD1*, POLE*, POT1*, PRKAR1A*, PTCH1*, PTEN*, RAD50*, RAD51C*, RAD51D*, RB1*, RECQL4, RET, RUNX1*, SDHA*, SDHAF2*, SDHB*, SDHC*, SDHD*, SMAD4*, SMARCA4*, SMARCB1*, SMARCE1*, STK11*, SUFU*, TERC, TERT, TMEM127*, Tp53*, TSC1*, TSC2*, VHL*, WRN*, and WT1.  RNA analysis is performed for * genes.   Left ovarian granulosa cell tumor  01/26/2022 Surgery   TLH, BSO, cystoscopy for simple endometrial hyperplasia   01/26/2022 Pathology Results      01/26/2022 Initial Diagnosis   Left ovarian granulosa cell tumor   03/08/2022 Tumor Marker   Postop tumor markers: Inhibin B <7.0 Inhibin A <0.3   03/17/2022 Imaging   CT C/A/P: IMPRESSION: 1. No findings to suggest recurrent disease or metastatic disease in the chest, abdomen or pelvis. 2. Incidental findings, as above.     CHIEF COMPLIANT: Follow-up on anastrozole  HISTORY OF PRESENT ILLNESS:   History of Present Illness   The patient, a breast cancer survivor, has been on anastrozole for four years. She initially experienced wrist and knee joint pain, a known side effect of the medication, but this has since subsided.  She attributes the improvement to the addition of calcium and vitamin D supplements to her regimen. The patient is tolerating the medication well and plans to continue it for a total of seven years, as  per the updated guidelines based on recent clinical trials.  In addition to breast cancer, the patient had a granulosa cell tumor on the ovary, which was discovered and removed during a hysterectomy last year. She is currently being monitored with annual CT scans due to the risk of recurrence. The patient inquired about the potential benefit of anastrozole for this type of tumor, but the doctor clarified that while it is a known treatment, it is not necessary in her case as the tumor was completely removed.  The patient also has a history of softer bone density, which has slightly worsened from -1 to -1.5.         ALLERGIES:  is allergic to azithromycin, penicillins, and nsaids.  MEDICATIONS:  Current Outpatient Medications  Medication Sig Dispense Refill   anastrozole (ARIMIDEX) 1 MG tablet TAKE 1 TABLET BY MOUTH EVERY DAY 90 tablet 3   Cholecalciferol (VITAMIN D3) 50 MCG (2000 UT) TABS Take by mouth daily.     COLLAGEN PO Take by mouth daily. POWDER     Cyanocobalamin (VITAMIN B 12 PO) Take by mouth daily at 12 noon.     Lidocaine HCl 5 % GEL Apply 1 Application topically daily as needed (apply to entrance of the vagina as needed for discomfort). 85 g 0   Omega-3 Fatty Acids (FISH OIL) 1200 MG CAPS Take 1 capsule by mouth daily.     No current facility-administered medications for this visit.    PHYSICAL EXAMINATION: ECOG PERFORMANCE STATUS: 1 - Symptomatic but completely ambulatory  Vitals:   05/03/23 0931  BP: (!) 124/90  Pulse: 72  Resp: 18  Temp: (!) 97.3 F (36.3 C)  SpO2: 97%   Filed Weights   05/03/23 0931  Weight: 146 lb 4.8 oz (66.4 kg)    Physical Exam          (exam performed in the presence of a chaperone)  LABORATORY DATA:  I have reviewed the data as listed    Latest Ref Rng & Units 01/24/2022    2:18 PM 12/19/2018   12:19 PM  CMP  Glucose 70 - 99 mg/dL 85  93   BUN 6 - 20 mg/dL 17  10   Creatinine 1.61 - 1.00 mg/dL 0.96  0.45   Sodium 409 - 145  mmol/L 139  139   Potassium 3.5 - 5.1 mmol/L 4.3  3.6   Chloride 98 - 111 mmol/L 108  106   CO2 22 - 32 mmol/L 25  23   Calcium 8.9 - 10.3 mg/dL 9.5  8.9   Total Protein 6.5 - 8.1 g/dL  7.3   Total Bilirubin 0.3 - 1.2 mg/dL  0.4   Alkaline Phos 38 - 126 U/L  34   AST 15 - 41 U/L  20   ALT 0 - 44 U/L  32     Lab Results  Component Value Date   WBC 4.9 01/24/2022   HGB 14.6 01/24/2022   HCT 43.3 01/24/2022   MCV 87.5 01/24/2022   PLT 225 01/24/2022   NEUTROABS 2.8 01/24/2022    ASSESSMENT & PLAN:  Malignant neoplasm of upper-inner quadrant of right breast in female, estrogen receptor positive (HCC) 12/12/2018:Screening mammogram detected a distortion in the right breast, measuring 8mm on diagnostic mammogram and [place].  Biopsy confirmed grade 1 IDC with DCIS, HER-2 negative by FISH, ER 90%, PR 90%, Ki67 10%.   01/17/19: Rt Lumpectomy IDC with DCIS, 1.2cm, grade 1, clear margins, 2 axillary lymph nodes negative.HER-2 negative by FISH, ER 90%, PR 90%, Ki67 10%.   Oncotype DX score 24: 10% ROR Adjuvant radiation therapy 02/07/2019-03/15/2011   Current treatment: Antiestrogen therapy with anastrozole started October 2020.  This was switched to tamoxifen February 2021 , switched to anastrozole May 2022     Anastrozole toxicities: Denies any hot flashes or arthralgias.     Breast cancer surveillance: 1.  Mammogram 12/15/2022: Benign breast density category C Bone density 11/28/2022: T score -1.5 (in 2022 it used to be -1): Mild osteopenia CT abdomen pelvis 10/24/2022: Negative 2.  Abbreviated breast MRIs 06/09/2022: Benign breast density category C  We will obtain another abbreviated breast MRI in December.  After that we will change her mammograms to contrast-enhanced mammograms every year to be done in June.  Subsequently we do not have to do abbreviated breast MRIs because of the contrast-enhanced mammograms being so effective.  Return to clinic in 1 year for  follow-up ------------------------------------- Assessment and Plan    Breast Cancer On Anastrozole with good tolerance. Joint pain improved with Vitamin D and Calcium supplementation. Plan is to continue Anastrozole for a total of 7 years (4 years completed). -Continue Anastrozole.  Granulosa Cell Tumor Removed during hysterectomy in 2023. Patient is being monitored with annual CT scans. Anastrozole may have some benefit for this condition. -Continue monitoring with annual CT scans.  Breast Health No current pain or discomfort. Patient is due for a contrast-enhanced mammogram in June 2025 and a screening MRI in December 2024. -Schedule contrast-enhanced mammogram for December 16, 2023. -Schedule screening MRI for December 2024.  Bone Health Slight decrease in bone density from -1 to -1.5. Patient is taking Vitamin D and Calcium. -Encourage 30 minutes of exercise or walking 4-5 days a week to improve bone density.  Follow-up in one year.          Orders Placed This Encounter  Procedures   MM 2D DIAG BILAT WITH CONTRAST BCG ONLY    Standing Status:   Future    Standing Expiration Date:   05/02/2024    Order Specific Question:   Reason for Exam (SYMPTOM  OR DIAGNOSIS REQUIRED)    Answer:   High risk breast cancer Contrast mammograms    Order Specific Question:   If indicated for the ordered procedure, I authorize the administration of contrast media per Radiology protocol    Answer:   Yes    Order Specific Question:   Does the patient have a contrast media/X-ray dye allergy?    Answer:   No    Order Specific Question:   Is the patient pregnant?    Answer:   No    Order Specific Question:   Preferred Imaging Location?    Answer:   [facility]    Order Specific Question:   Release to patient    Answer:   Immediate   MR BREAST W & WO CM SCREENING (GI)    We will not file insurance  Self pay $399  Prof Pf:12/15/22 Dx:Malignant neoplasm of upper-inner quadrant of right  breast in female, estrogen receptor positive GE952: HT:5'5 NO NEEDS/no CLAUS/ NO METAL REMOVED/ NO IMPLANTS/ NO BULLETS OR BB'S/ NO GLUCOSE MONITOR,no STIMULATOR OR INJECTORS DEFIBRULATOR NO PORT NO BRAIN CLIP / NO PREV SX/ NO BRAIN HEART EYE OR EAR  SX sk and pt ** PT AWARE OF 75$ NO SHOW FEE**    Standing Status:   Future    Standing Expiration Date:   05/02/2024    Order Specific Question:   If indicated for the ordered procedure, I authorize the administration of contrast media per Radiology protocol    Answer:   Yes    Order Specific Question:   What is the patient's sedation requirement?    Answer:   No Sedation    Order Specific Question:   Does the patient have a pacemaker or implanted devices?    Answer:   No    Order Specific Question:   Preferred imaging location?    Answer:   [facility] (table limit-550lbs)   The patient has a good understanding of the overall plan. she agrees with it. she will call with any problems that may develop before the next visit here. Total time spent: 30 mins including face to face time and time spent for planning, charting and co-ordination of care   Tamsen Meek, MD 05/03/23

## 2023-05-03 NOTE — Assessment & Plan Note (Addendum)
12/12/2018:Screening mammogram detected a distortion in the right breast, measuring 8mm on diagnostic mammogram and [place]. Biopsy confirmed grade 1 IDC with DCIS, HER-2 negative by FISH, ER 90%, PR 90%, Ki67 10%.   01/17/19: Rt Lumpectomy IDC with DCIS, 1.2cm, grade 1, clear margins, 2 axillary lymph nodes negative.HER-2 negative by FISH, ER 90%, PR 90%, Ki67 10%.   Oncotype DX score 24: 10% ROR Adjuvant radiation therapy 02/07/2019-03/15/2011   Current treatment: Antiestrogen therapy with anastrozole started October 2020.  This was switched to tamoxifen February 2021 , switched to anastrozole May 2022     Anastrozole toxicities: Denies any hot flashes or arthralgias.     Breast cancer surveillance: 1.  Mammogram 12/15/2022: Benign breast density category C Bone density 11/28/2022: T score -1.5 (in 2022 it used to be -1): Mild osteopenia CT abdomen pelvis 10/24/2022: Negative 2.  Abbreviated breast MRIs 06/09/2022: Benign breast density category C  We will obtain another abbreviated breast MRI in December.  After that we will change her mammograms to contrast-enhanced mammograms every year to be done in June.  Subsequently we do not have to do abbreviated breast MRIs because of the contrast-enhanced mammograms being so effective.  Return to clinic in 1 year for follow-up

## 2023-06-16 ENCOUNTER — Ambulatory Visit
Admission: RE | Admit: 2023-06-16 | Discharge: 2023-06-16 | Disposition: A | Discharge status: Home / Self Care | Payer: BC Managed Care – PPO | Source: Ambulatory Visit | Attending: Hematology and Oncology | Admitting: Hematology and Oncology

## 2023-06-16 DIAGNOSIS — Z170 Estrogen receptor positive status [ER+]: Secondary | ICD-10-CM

## 2023-06-16 MED ORDER — GADOPICLENOL 0.5 MMOL/ML IV SOLN
7.0000 mL | Freq: Once | INTRAVENOUS | Status: AC | PRN
  Administered 2023-06-16: 7 mL via INTRAVENOUS

## 2023-06-18 ENCOUNTER — Encounter: Payer: Self-pay | Admitting: Hematology and Oncology

## 2023-06-19 ENCOUNTER — Other Ambulatory Visit: Payer: Self-pay | Admitting: Hematology and Oncology

## 2023-06-19 ENCOUNTER — Other Ambulatory Visit: Payer: Self-pay | Admitting: *Deleted

## 2023-06-19 DIAGNOSIS — Z170 Estrogen receptor positive status [ER+]: Secondary | ICD-10-CM

## 2023-06-19 DIAGNOSIS — R9389 Abnormal findings on diagnostic imaging of other specified body structures: Secondary | ICD-10-CM

## 2023-06-22 ENCOUNTER — Ambulatory Visit
Admission: RE | Admit: 2023-06-22 | Discharge: 2023-06-22 | Disposition: A | Discharge status: Home / Self Care | Payer: BC Managed Care – PPO | Source: Ambulatory Visit | Attending: Hematology and Oncology | Admitting: Hematology and Oncology

## 2023-06-22 DIAGNOSIS — R9389 Abnormal findings on diagnostic imaging of other specified body structures: Secondary | ICD-10-CM

## 2023-06-22 MED ORDER — GADOPICLENOL 0.5 MMOL/ML IV SOLN
7.0000 mL | Freq: Once | INTRAVENOUS | Status: AC | PRN
  Administered 2023-06-22: 7 mL via INTRAVENOUS

## 2023-06-26 LAB — SURGICAL PATHOLOGY

## 2023-06-29 ENCOUNTER — Inpatient Hospital Stay: Payer: BC Managed Care – PPO | Attending: Hematology and Oncology | Admitting: Hematology and Oncology

## 2023-06-29 VITALS — BP 112/72 | HR 75 | Temp 97.9°F | Resp 17 | Ht 66.0 in | Wt 149.2 lb

## 2023-06-29 DIAGNOSIS — C50211 Malignant neoplasm of upper-inner quadrant of right female breast: Secondary | ICD-10-CM

## 2023-06-29 DIAGNOSIS — Z90710 Acquired absence of both cervix and uterus: Secondary | ICD-10-CM | POA: Diagnosis not present

## 2023-06-29 DIAGNOSIS — Z8543 Personal history of malignant neoplasm of ovary: Secondary | ICD-10-CM | POA: Diagnosis not present

## 2023-06-29 DIAGNOSIS — Z79811 Long term (current) use of aromatase inhibitors: Secondary | ICD-10-CM | POA: Insufficient documentation

## 2023-06-29 DIAGNOSIS — Z923 Personal history of irradiation: Secondary | ICD-10-CM | POA: Insufficient documentation

## 2023-06-29 DIAGNOSIS — Z90722 Acquired absence of ovaries, bilateral: Secondary | ICD-10-CM | POA: Diagnosis not present

## 2023-06-29 DIAGNOSIS — Z1721 Progesterone receptor positive status: Secondary | ICD-10-CM | POA: Insufficient documentation

## 2023-06-29 DIAGNOSIS — Z170 Estrogen receptor positive status [ER+]: Secondary | ICD-10-CM

## 2023-06-29 DIAGNOSIS — Z79899 Other long term (current) drug therapy: Secondary | ICD-10-CM | POA: Insufficient documentation

## 2023-06-29 DIAGNOSIS — Z1732 Human epidermal growth factor receptor 2 negative status: Secondary | ICD-10-CM | POA: Insufficient documentation

## 2023-06-29 DIAGNOSIS — E785 Hyperlipidemia, unspecified: Secondary | ICD-10-CM | POA: Diagnosis not present

## 2023-06-29 MED ORDER — ANASTROZOLE 1 MG PO TABS: 1.0000 mg | ORAL_TABLET | Freq: Every day | ORAL | 3 refills | Status: AC

## 2023-06-29 NOTE — Assessment & Plan Note (Signed)
 12/12/2018:Screening mammogram detected a distortion in the right breast, measuring 8mm on diagnostic mammogram and US . Biopsy confirmed grade 1 IDC with DCIS, HER-2 negative by FISH, ER 90%, PR 90%, Ki67 10%.   01/17/19: Rt Lumpectomy IDC with DCIS, 1.2cm, grade 1, clear margins, 2 axillary lymph nodes negative.HER-2 negative by FISH, ER 90%, PR 90%, Ki67 10%.   Oncotype DX score 24: 10% ROR Adjuvant radiation therapy 02/07/2019-03/15/2011   Current treatment: Antiestrogen therapy with anastrozole  started October 2020.  This was switched to tamoxifen  February 2021 , switched to anastrozole  May 2022     Anastrozole  toxicities: Denies any hot flashes or arthralgias.    Breast cancer surveillance: 1.  Mammogram 12/15/2022: Benign breast density category C Bone density 11/28/2022: T score -1.5 (in 2022 it used to be -1): Mild osteopenia CT abdomen pelvis 10/24/2022: Negative 2. breast MRI 06/16/2023: Indeterminate 7 mm mass left anterior right breast 3.  Right breast biopsy 06/22/2023: Benign  I discussed with her about switching from MRI surveillance to contrast-enhanced mammogram surveillance.

## 2023-06-29 NOTE — Progress Notes (Signed)
 Patient Care Team: Spry, Powell HERO., MD as PCP - General (Family Medicine) Curvin Deward MOULD, MD as Consulting Physician (General Surgery) Odean Potts, MD as Consulting Physician (Hematology and Oncology) Izell Domino, MD as Attending Physician (Radiation Oncology)  DIAGNOSIS:  Encounter Diagnosis  Name Primary?   Malignant neoplasm of upper-inner quadrant of right breast in female, estrogen receptor positive (HCC) Yes    SUMMARY OF ONCOLOGIC HISTORY: Oncology History  Malignant neoplasm of upper-inner quadrant of right breast in female, estrogen receptor positive (HCC)  12/12/2018 Initial Diagnosis   Screening mammogram detected a distortion in the right breast, measuring 8mm on diagnostic mammogram and US . Biopsy confirmed grade 1 IDC with DCIS, HER-2 negative by FISH, ER 90%, PR 90%, Ki67 10%.    12/19/2018 Cancer Staging   Staging form: Breast, AJCC 8th Edition - Clinical stage from 12/19/2018: Stage IA (cT1b, cN0, cM0, G1, ER+, PR+, HER2-) - Signed by Odean Potts, MD on 12/19/2018   01/02/2019 Genetic Testing   Negative genetic testing on the [facility] Breast Cancer STAT panel. The STAT Breast cancer panel offered by [facility] includes sequencing and rearrangement analysis for the following 9 genes:  ATM, BRCA1, BRCA2, CDH1, CHEK2, PALB2, PTEN, STK11 and TP53. The report date is 01/02/2019.   01/17/2019 Surgery   Right lumpectomy Osker): IDC with DCIS, 1.2cm, grade 1, clear margins, 2 axillary lymph nodes negative.   01/17/2019 Cancer Staging   Staging form: Breast, AJCC 8th Edition - Pathologic stage from 01/17/2019: Stage IA (pT1c, pN0, cM0, G1, ER+, PR+, HER2-, Oncotype DX score: 24) - Signed by Crawford Morna Pickle, NP on 05/27/2019   01/31/2019 Oncotype testing   Score of 24 with 10% chance of distant recurrence in 9 years without systemic treatment.    02/15/2019 - 03/15/2019 Radiation Therapy   Adjuvant radiation treatment   03/2019 -  Anti-estrogen oral therapy    Anastrozole    03/13/2022 Genetic Testing   Updated testing: Negative genetic testing on [facility] Multi-Cancer +RNA Panel.  Report date is 03/13/2022.   The Multi-Cancer + RNA Panel offered by [facility] includes sequencing and/or deletion/duplication analysis of the following 84 genes:  AIP*, ALK, APC*, ATM*, AXIN2*, BAP1*, BARD1*, BLM*, BMPR1A*, BRCA1*, BRCA2*, BRIP1*, CASR, CDC73*, CDH1*, CDK4, CDKN1B*, CDKN1C*, CDKN2A, CEBPA, CHEK2*, CTNNA1*, DICER1*, DIS3L2*, EGFR, EPCAM, FH*, FLCN*, GATA2*, GPC3, GREM1, HOXB13, HRAS, KIT, MAX*, MEN1*, MET, MITF, MLH1*, MSH2*, MSH3*, MSH6*, MUTYH*, NBN*, NF1*, NF2*, NTHL1*, PALB2*, PDGFRA, PHOX2B, PMS2*, POLD1*, POLE*, POT1*, PRKAR1A*, PTCH1*, PTEN*, RAD50*, RAD51C*, RAD51D*, RB1*, RECQL4, RET, RUNX1*, SDHA*, SDHAF2*, SDHB*, SDHC*, SDHD*, SMAD4*, SMARCA4*, SMARCB1*, SMARCE1*, STK11*, SUFU*, TERC, TERT, TMEM127*, Tp53*, TSC1*, TSC2*, VHL*, WRN*, and WT1.  RNA analysis is performed for * genes.   Left ovarian granulosa cell tumor  01/26/2022 Surgery   TLH, BSO, cystoscopy for simple endometrial hyperplasia   01/26/2022 Pathology Results      01/26/2022 Initial Diagnosis   Left ovarian granulosa cell tumor   03/08/2022 Tumor Marker   Postop tumor markers: Inhibin B <7.0 Inhibin A <0.3   03/17/2022 Imaging   CT C/A/P: IMPRESSION: 1. No findings to suggest recurrent disease or metastatic disease in the chest, abdomen or pelvis. 2. Incidental findings, as above.     CHIEF COMPLIANT: Follow-up of history of breast cancer status post recent biopsy  HISTORY OF PRESENT ILLNESS:   History of Present Illness   Debbie Berry, a patient with a history of breast cancer and granulosa cell tumor in the ovaries, presents for a follow-up after a recent biopsy. The biopsy  was performed due to a suspicious finding on her MRI, which turned out to be a false positive. She expresses relief at the benign result but also frustration at the repeated false positives from her annual screenings.  She is currently on anastrozole  for breast cancer prevention, which she tolerates well after initial joint pain and stiffness. She also reports a history of kidney stones, which she attributes to excessive calcium intake to manage her joint symptoms.         ALLERGIES:  is allergic to azithromycin, penicillins, and nsaids.  MEDICATIONS:  Current Outpatient Medications  Medication Sig Dispense Refill   anastrozole  (ARIMIDEX ) 1 MG tablet Take 1 tablet (1 mg total) by mouth daily. 90 tablet 3   Cholecalciferol (VITAMIN D3) 50 MCG (2000 UT) TABS Take by mouth daily.     COLLAGEN PO Take by mouth daily. POWDER     Cyanocobalamin (VITAMIN B 12 PO) Take by mouth daily at 12 noon.     Lidocaine  HCl 5 % GEL Apply 1 Application topically daily as needed (apply to entrance of the vagina as needed for discomfort). 85 g 0   Omega-3 Fatty Acids (FISH OIL) 1200 MG CAPS Take 1 capsule by mouth daily.     No current facility-administered medications for this visit.    PHYSICAL EXAMINATION: ECOG PERFORMANCE STATUS: 1 - Symptomatic but completely ambulatory  Vitals:   06/29/23 1211  BP: 112/72  Pulse: 75  Resp: 17  Temp: 97.9 F (36.6 C)  SpO2: 100%   Filed Weights   06/29/23 1211  Weight: 149 lb 3 oz (67.7 kg)      LABORATORY DATA:  I have reviewed the data as listed    Latest Ref Rng & Units 01/24/2022    2:18 PM 12/19/2018   12:19 PM  CMP  Glucose 70 - 99 mg/dL 85  93   BUN 6 - 20 mg/dL 17  10   Creatinine 9.55 - 1.00 mg/dL 9.31  9.19   Sodium 864 - 145 mmol/L 139  139   Potassium 3.5 - 5.1 mmol/L 4.3  3.6   Chloride 98 - 111 mmol/L 108  106   CO2 22 - 32 mmol/L 25  23   Calcium 8.9 - 10.3 mg/dL 9.5  8.9   Total Protein 6.5 - 8.1 g/dL  7.3   Total Bilirubin 0.3 - 1.2 mg/dL  0.4   Alkaline Phos 38 - 126 U/L  34   AST 15 - 41 U/L  20   ALT 0 - 44 U/L  32     Lab Results  Component Value Date   WBC 4.9 01/24/2022   HGB 14.6 01/24/2022   HCT 43.3 01/24/2022   MCV 87.5  01/24/2022   PLT 225 01/24/2022   NEUTROABS 2.8 01/24/2022    ASSESSMENT & PLAN:  Malignant neoplasm of upper-inner quadrant of right breast in female, estrogen receptor positive (HCC) 12/12/2018:Screening mammogram detected a distortion in the right breast, measuring 8mm on diagnostic mammogram and US . Biopsy confirmed grade 1 IDC with DCIS, HER-2 negative by FISH, ER 90%, PR 90%, Ki67 10%.   01/17/19: Rt Lumpectomy IDC with DCIS, 1.2cm, grade 1, clear margins, 2 axillary lymph nodes negative.HER-2 negative by FISH, ER 90%, PR 90%, Ki67 10%.   Oncotype DX score 24: 10% ROR Adjuvant radiation therapy 02/07/2019-03/15/2011   Current treatment: Antiestrogen therapy with anastrozole  started October 2020.  This was switched to tamoxifen  February 2021 , switched to anastrozole  May 2022  Anastrozole  toxicities: Denies any hot flashes or arthralgias.    Breast cancer surveillance: 1.  Mammogram 12/15/2022: Benign breast density category C Bone density 11/28/2022: T score -1.5 (in 2022 it used to be -1): Mild osteopenia CT abdomen pelvis 10/24/2022: Negative 2. breast MRI 06/16/2023: Indeterminate 7 mm mass left anterior right breast 3.  Right breast biopsy 06/22/2023: Benign  She will undergo contrast-enhanced mammograms annually for surveillance okay ------------------------------------- Assessment and Plan    Breast Cancer Surveillance Recent biopsy showed benign findings. Patient is currently on Anastrozole . Discussed the possibility of false positives with current imaging modalities and the plan to switch to contrast enhanced mammogram to reduce these. -Continue Anastrozole . -Contrast enhanced mammogram scheduled for June 2025.  Cancer Detection Discussed the use of a novel blood test (Guardant) that detects cancer DNA in the bloodstream, potentially identifying recurrence 1-2 years before it shows up on imaging. Currently, this test is specific for breast cancer. -Set up Guardant blood  test to be done every six months. The company will contact the patient for home-based blood draw.  Hyperlipidemia Patient is on cholesterol supplements. -Continue current regimen.  Follow-up Next appointment scheduled for November 2025.          No orders of the defined types were placed in this encounter.  The patient has a good understanding of the overall plan. she agrees with it. she will call with any problems that may develop before the next visit here. Total time spent: 30 mins including face to face time and time spent for planning, charting and co-ordination of care   Debbie MARLA Chad, MD 06/29/23

## 2023-06-30 ENCOUNTER — Encounter: Payer: Self-pay | Admitting: *Deleted

## 2023-06-30 NOTE — Progress Notes (Signed)
Per MD request, RN successfully faxed Guardant Reveal orders.

## 2023-07-05 ENCOUNTER — Encounter: Payer: Self-pay | Admitting: Diagnostic Radiology

## 2023-07-18 ENCOUNTER — Encounter: Payer: Self-pay | Admitting: Hematology and Oncology

## 2023-10-17 ENCOUNTER — Inpatient Hospital Stay: Payer: BC Managed Care – PPO | Attending: Hematology and Oncology

## 2023-10-17 DIAGNOSIS — Z8543 Personal history of malignant neoplasm of ovary: Secondary | ICD-10-CM | POA: Insufficient documentation

## 2023-10-17 DIAGNOSIS — C50211 Malignant neoplasm of upper-inner quadrant of right female breast: Secondary | ICD-10-CM | POA: Diagnosis present

## 2023-10-17 DIAGNOSIS — Z90722 Acquired absence of ovaries, bilateral: Secondary | ICD-10-CM | POA: Insufficient documentation

## 2023-10-17 DIAGNOSIS — Z923 Personal history of irradiation: Secondary | ICD-10-CM | POA: Diagnosis not present

## 2023-10-17 DIAGNOSIS — Z79811 Long term (current) use of aromatase inhibitors: Secondary | ICD-10-CM | POA: Insufficient documentation

## 2023-10-17 DIAGNOSIS — E785 Hyperlipidemia, unspecified: Secondary | ICD-10-CM | POA: Diagnosis not present

## 2023-10-17 DIAGNOSIS — S30814A Abrasion of vagina and vulva, initial encounter: Secondary | ICD-10-CM | POA: Diagnosis not present

## 2023-10-17 DIAGNOSIS — Z90710 Acquired absence of both cervix and uterus: Secondary | ICD-10-CM | POA: Insufficient documentation

## 2023-10-17 DIAGNOSIS — Z1732 Human epidermal growth factor receptor 2 negative status: Secondary | ICD-10-CM | POA: Insufficient documentation

## 2023-10-17 DIAGNOSIS — Z1721 Progesterone receptor positive status: Secondary | ICD-10-CM | POA: Diagnosis not present

## 2023-10-17 DIAGNOSIS — N958 Other specified menopausal and perimenopausal disorders: Secondary | ICD-10-CM | POA: Diagnosis not present

## 2023-10-17 DIAGNOSIS — C562 Malignant neoplasm of left ovary: Secondary | ICD-10-CM | POA: Insufficient documentation

## 2023-10-17 DIAGNOSIS — Z170 Estrogen receptor positive status [ER+]: Secondary | ICD-10-CM | POA: Diagnosis not present

## 2023-10-17 DIAGNOSIS — Z79899 Other long term (current) drug therapy: Secondary | ICD-10-CM | POA: Diagnosis not present

## 2023-10-18 LAB — INHIBIN A: Inhibin-A: <1 pg/mL

## 2023-10-19 LAB — INHIBIN B: Inhibin B: <7 pg/mL

## 2023-10-20 ENCOUNTER — Ambulatory Visit
Admission: RE | Admit: 2023-10-20 | Discharge: 2023-10-20 | Disposition: A | Discharge status: Home / Self Care | Payer: BC Managed Care – PPO | Source: Ambulatory Visit | Attending: Psychiatry | Admitting: Psychiatry

## 2023-10-20 DIAGNOSIS — C562 Malignant neoplasm of left ovary: Secondary | ICD-10-CM

## 2023-10-20 MED ORDER — IOPAMIDOL (ISOVUE-300) INJECTION 61%
100.0000 mL | Freq: Once | INTRAVENOUS | Status: AC | PRN
  Administered 2023-10-20: 100 mL via INTRAVENOUS

## 2023-10-23 ENCOUNTER — Encounter: Payer: Self-pay | Admitting: Psychiatry

## 2023-10-30 ENCOUNTER — Encounter: Payer: Self-pay | Admitting: Psychiatry

## 2023-10-30 ENCOUNTER — Inpatient Hospital Stay: Payer: BC Managed Care – PPO | Attending: Hematology and Oncology | Admitting: Psychiatry

## 2023-10-30 VITALS — BP 109/78 | HR 74 | Temp 98.5°F | Resp 16 | Ht 65.5 in | Wt 151.0 lb

## 2023-10-30 DIAGNOSIS — Z79811 Long term (current) use of aromatase inhibitors: Secondary | ICD-10-CM | POA: Diagnosis not present

## 2023-10-30 DIAGNOSIS — Z8543 Personal history of malignant neoplasm of ovary: Secondary | ICD-10-CM

## 2023-10-30 DIAGNOSIS — C50211 Malignant neoplasm of upper-inner quadrant of right female breast: Secondary | ICD-10-CM | POA: Diagnosis not present

## 2023-10-30 DIAGNOSIS — Z9079 Acquired absence of other genital organ(s): Secondary | ICD-10-CM | POA: Diagnosis not present

## 2023-10-30 DIAGNOSIS — Z923 Personal history of irradiation: Secondary | ICD-10-CM | POA: Insufficient documentation

## 2023-10-30 DIAGNOSIS — E8941 Symptomatic postprocedural ovarian failure: Secondary | ICD-10-CM

## 2023-10-30 DIAGNOSIS — N958 Other specified menopausal and perimenopausal disorders: Secondary | ICD-10-CM

## 2023-10-30 DIAGNOSIS — Z170 Estrogen receptor positive status [ER+]: Secondary | ICD-10-CM | POA: Insufficient documentation

## 2023-10-30 DIAGNOSIS — Z90710 Acquired absence of both cervix and uterus: Secondary | ICD-10-CM | POA: Insufficient documentation

## 2023-10-30 DIAGNOSIS — Z90722 Acquired absence of ovaries, bilateral: Secondary | ICD-10-CM | POA: Diagnosis not present

## 2023-10-30 DIAGNOSIS — C562 Malignant neoplasm of left ovary: Secondary | ICD-10-CM

## 2023-10-30 NOTE — Patient Instructions (Signed)
 It was a pleasure to see you in clinic today. - We discussed referral to the menopause clinic with Dr. Katelin Zahn at [facility]. - Return visit planned for 6 mo  Thank you very much for allowing me to provide care for you today.  I appreciate your confidence in choosing our Gynecologic Oncology team at [facility].  If you have any questions about your visit today please call our office or send us  a MyChart message and we will get back to you as soon as possible.

## 2023-10-30 NOTE — Progress Notes (Signed)
 Gynecologic Oncology Return Clinic Visit  Date of Service: 10/30/2023 Referring Provider: Zenia Hight, MD  Assessment & Plan: Debbie Berry is a 53 y.o. 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   Granulosa cell tumor: - NED on exam - Inhibin A /B negative from 10/17/23 - Signs/symptoms of recurrence reviewed. - Given that we do not know if inhibin is a marker for her, underwent 1 year follow-up CT 10/20/23. CT is NED. Repeat in 1 year. If next is negative, will likely discontinue scans. - Continue follow-up q49mo through 2 years then yearly.  Genitourinary syndrome of menopause : - Has not had improvement with topical treatments - Given HR+ breast cancer and granulosa cell tumor, topical estrogen is not a great option for her. - Offered referral to menopause clinic through [facility]. Will send referral.  RTC 32mo  Derrel Flies, MD Gynecologic Oncology   Medical Decision Making I personally spent  TOTAL 30 minutes face-to-face and non-face-to-face in the care of this patient, which includes all pre, intra, and post visit time on the date of service.  ----------------------- Reason for Visit: Surveillance  Treatment History: Oncology History  Malignant neoplasm of upper-inner quadrant of right breast in female, estrogen receptor positive (HCC)  12/12/2018 Initial Diagnosis   Screening mammogram detected a distortion in the right breast, measuring 8mm on diagnostic mammogram and US . Biopsy confirmed grade 1 IDC with DCIS, HER-2 negative by FISH, ER 90%, PR 90%, Ki67 10%.    12/19/2018 Cancer Staging   Staging form: Breast, AJCC 8th Edition - Clinical stage from 12/19/2018: Stage IA (cT1b, cN0, cM0, G1, ER+, PR+, HER2-) - Signed by Cameron Cea, MD on 12/19/2018   01/02/2019 Genetic Testing   Negative genetic testing on the [facility] Breast Cancer STAT panel. The STAT Breast cancer  panel offered by [facility] includes sequencing and rearrangement analysis for the following 9 genes:  ATM, BRCA1, BRCA2, CDH1, CHEK2, PALB2, PTEN, STK11 and TP53. The report date is 01/02/2019.   01/17/2019 Surgery   Right lumpectomy Alethea Andes): IDC with DCIS, 1.2cm, grade 1, clear margins, 2 axillary lymph nodes negative.   01/17/2019 Cancer Staging   Staging form: Breast, AJCC 8th Edition - Pathologic stage from 01/17/2019: Stage IA (pT1c, pN0, cM0, G1, ER+, PR+, HER2-, Oncotype DX score: 24) - Signed by Percival Brace, NP on 05/27/2019   01/31/2019 Oncotype testing   Score of 24 with 10% chance of distant recurrence in 9 years without systemic treatment.    02/15/2019 - 03/15/2019 Radiation Therapy   Adjuvant radiation treatment   03/2019 -  Anti-estrogen oral therapy   Anastrozole    03/13/2022 Genetic Testing   Updated testing: Negative genetic testing on [facility] Multi-Cancer +RNA Panel.  Report date is 03/13/2022.   The Multi-Cancer + RNA Panel offered by [facility] includes sequencing and/or deletion/duplication analysis of the following 84 genes:  AIP*, ALK, APC*, ATM*, AXIN2*, BAP1*, BARD1*, BLM*, BMPR1A*, BRCA1*, BRCA2*, BRIP1*, CASR, CDC73*, CDH1*, CDK4, CDKN1B*, CDKN1C*, CDKN2A, CEBPA, CHEK2*, CTNNA1*, DICER1*, DIS3L2*, EGFR, EPCAM, FH*, FLCN*, GATA2*, GPC3, GREM1, HOXB13, HRAS, KIT, MAX*, MEN1*, MET, MITF, MLH1*, MSH2*, MSH3*, MSH6*, MUTYH*, NBN*, NF1*, NF2*, NTHL1*, PALB2*, PDGFRA, PHOX2B, PMS2*, POLD1*, POLE*, POT1*, PRKAR1A*, PTCH1*, PTEN*, RAD50*, RAD51C*, RAD51D*, RB1*, RECQL4, RET, RUNX1*, SDHA*, SDHAF2*, SDHB*, SDHC*, SDHD*, SMAD4*, SMARCA4*, SMARCB1*, SMARCE1*, STK11*, SUFU*, TERC, TERT, TMEM127*, Tp53*, TSC1*, TSC2*, VHL*, WRN*, and WT1.  RNA analysis is performed for * genes.  Left ovarian granulosa cell tumor  01/26/2022 Surgery   TLH, BSO, cystoscopy for simple endometrial hyperplasia   01/26/2022 Pathology Results      01/26/2022 Initial Diagnosis   Left ovarian granulosa  cell tumor   03/08/2022 Tumor Marker   Postop tumor markers: Inhibin B <7.0 Inhibin A <0.3   03/17/2022 Imaging   CT C/A/P: IMPRESSION: 1. No findings to suggest recurrent disease or metastatic disease in the chest, abdomen or pelvis. 2. Incidental findings, as above.     Interval History: Overall doing well.  Tried the topical lidocaine .  Also reports that she has tried some sort of suppository.  Previously tried other samples from her OB/GYN.  None of them have helped.  No new vaginal bleeding, abdominal/pelvic pain, unintentional weight loss, change in bowel or bladder habits, early satiety, bloating, nausea/vomiting.    Past Medical/Surgical History: Past Medical History:  Diagnosis Date   Complication of anesthesia    Patient stated that after her surgery at [facility] in March 2023, she experienced nausae.   Family history of cervical cancer    History of COVID-19 04/2021   per pt mild symptoms that resolved   History of external beam radiation therapy    02-15-2019  to 03-15-2019  right breast cancer   Hyperlipidemia    Malignant neoplasm of upper-inner quadrant of right breast in female, estrogen receptor positive (HCC) 11/2018   oncologist--- dr Lee Public;  dx 06/ 2020,  IDC , Stage IA, ER/ PR +;  completed radiation 03-15-2019   Migraine    PMB (postmenopausal bleeding)    PONV (postoperative nausea and vomiting)    Vertigo    Wears contact lenses     Past Surgical History:  Procedure Laterality Date   BREAST BIOPSY Right 11/2018   BREAST LUMPECTOMY WITH RADIOACTIVE SEED AND SENTINEL LYMPH NODE BIOPSY Right 01/17/2019   Procedure: RIGHT BREAST LUMPECTOMY WITH RADIOACTIVE SEED AND SENTINEL LYMPH NODE BIOPSY;  Surgeon: Caralyn Chandler, MD;  Location: [facility];  Service: General;  Laterality: Right;   CHOLECYSTECTOMY, LAPAROSCOPIC  2013   COLONOSCOPY  02/2020   CYSTOSCOPY N/A 01/26/2022   Procedure: CYSTOSCOPY;  Surgeon: Theodoro Fisherman, MD;  Location:  [facility];  Service: Gynecology;  Laterality: N/A;   DILATATION & CURETTAGE/HYSTEROSCOPY WITH MYOSURE N/A 09/15/2021   Procedure: DILATATION & CURETTAGE/HYSTEROSCOPY;  Surgeon: Theodoro Fisherman, MD;  Location: [facility];  Service: Gynecology;  Laterality: N/A;   INCISIONAL HERNIA REPAIR  2015   POLYPECTOMY N/A 09/15/2021   Procedure: POLYPECTOMY WITH MYOSURE;  Surgeon: Theodoro Fisherman, MD;  Location: [facility];  Service: Gynecology;  Laterality: N/A;   TOTAL LAPAROSCOPIC HYSTERECTOMY WITH SALPINGECTOMY N/A 01/26/2022   Procedure: TOTAL LAPAROSCOPIC HYSTERECTOMY WITH SALPINGO-OOPHORECTOMY;  Surgeon: Theodoro Fisherman, MD;  Location: [facility];  Service: Gynecology;  Laterality: N/A;    Family History  Problem Relation Age of Onset   Colon polyps Mother    Cervical cancer Paternal Grandmother    Cervical cancer Paternal Aunt 38   Colon cancer Neg Hx    Esophageal cancer Neg Hx    Rectal cancer Neg Hx    Stomach cancer Neg Hx     Social History   Socioeconomic History   Marital status: Married    Spouse name: Not on file   Number of children: Not on file   Years of education: Not on file   Highest education level: Not on file  Occupational  History   Not on file  Tobacco Use   Smoking status: Former    Current packs/day: 0.00    Types: Cigarettes    Start date: 12/1987    Quit date: 64    Years since quitting: 35.3   Smokeless tobacco: Never  Vaping Use   Vaping status: Never Used  Substance and Sexual Activity   Alcohol use: Yes    Comment: rare   Drug use: Never   Sexual activity: Not on file  Other Topics Concern   Not on file  Social History Narrative   Not on file   Social Drivers of Health   Financial Resource Strain: Low Risk  (08/30/2021)   [facility] to [facility]  prior to 08/20/2022.   Overall Financial Resource Strain (CARDIA)    Difficulty of Paying Living Expenses: Not hard at all  Food Insecurity: Low Risk  (09/10/2023)   [facility]   Hunger Vital Sign    Worried About Running Out of Food in the Last Year: Never true    Ran Out of Food in the Last Year: Never true  Transportation Needs: No Transportation Needs (09/10/2023)   Received from [employer]    In the past 12 months, has lack of reliable transportation kept you from medical appointments, meetings, work or from getting things needed for daily living? : No  Physical Activity: Insufficiently Active (08/30/2021)   [facility] prior to 08/20/2022., [facility] visits prior to 08/20/2022.   Exercise Vital Sign    Days of Exercise per Week: 2 days    Minutes of Exercise per Session: 20 min  Stress: Stress Concern Present (08/30/2021)   [facility] prior to 08/20/2022., [facility] visits prior to 08/20/2022.   [employer] of Occupational Health - Occupational Stress Questionnaire    Feeling of Stress : To some extent  Social Connections: Moderately Integrated (08/30/2021)   [facility] visits prior to 08/20/2022., [facility] visits prior to 08/20/2022.   Social Advertising account executive [NHANES]    Frequency of Communication with Friends and Family: Twice a week    Frequency of Social Gatherings with Friends and Family: Once a week    Attends Religious Services: 1 to 4 times per year    Active Member of [employer] or Organizations: No    Attends Banker Meetings: Never    Marital Status: Married    Current Medications:  Current Outpatient Medications:    anastrozole   (ARIMIDEX ) 1 MG tablet, Take 1 tablet (1 mg total) by mouth daily., Disp: 90 tablet, Rfl: 3   Cholecalciferol (VITAMIN D3) 50 MCG (2000 UT) TABS, Take by mouth daily., Disp: , Rfl:    COLLAGEN PO, Take by mouth daily. POWDER, Disp: , Rfl:    Cyanocobalamin (VITAMIN B 12 PO), Take by mouth daily at 12 noon., Disp: , Rfl:    Lidocaine  HCl 5 % GEL, Apply 1 Application topically daily as needed (apply to entrance of the vagina as needed for discomfort)., Disp: 85 g, Rfl: 0   Omega-3 Fatty Acids (FISH OIL) 1200 MG CAPS, Take 1 capsule by mouth daily., Disp: , Rfl:  Review of Symptoms: Complete 10-system review is positive for pain with intercourse  Physical Exam: BP 109/78 (BP Location: Left Arm, Patient Position: Sitting)   Pulse 74   Temp 98.5 F (36.9 C) (Oral)   Resp 16   Ht 5' 5.5" (1.664 m)   Wt 151 lb (68.5 kg)   LMP 12/19/2018 (Exact Date) Comment: Patient stopped having periods due to taking Arimidex  per pt.  SpO2 100%   BMI 24.75 kg/m  General: Alert, oriented, no acute distress. HEENT: Normocephalic, atraumatic. Neck symmetric without masses. Sclera anicteric.  Chest: Normal work of breathing. Clear to auscultation bilaterally.   Cardiovascular: Regular rate and rhythm, no murmurs. Abdomen: Soft, nontender.  Normoactive bowel sounds.  No masses or hepatosplenomegaly appreciated.  Well-healed incisions. Extremities: Grossly normal range of motion.  Warm, well perfused.  No edema bilaterally. Skin: No rashes or lesions noted. Lymphatics: No cervical, supraclavicular, or inguinal adenopathy. GU: Normal appearing external genitalia without erythema, excoriation, or lesions.  Speculum exam reveals normal cuff without lesions. Atrophic vaginal mucosa.  Bimanual exam reveals smooth cuff. No nodularity. No pelvic mass. Exam chaperoned by Kimberly Swaziland, CMA   Laboratory & Radiologic Studies: Lab Results  Component Value Date   INHBB <7.0 10/17/2023   INHBB <7.0 04/06/2023    INHBB <7.0 09/13/2022  Inhibin A: <1.0 (10/17/23) Inhibin A: 0.4 (09/13/22) Inhibin A: <0.3 (03/08/22) Inhibin A: <0.3 (04/06/23)

## 2024-01-09 ENCOUNTER — Encounter: Payer: Self-pay | Admitting: Hematology and Oncology

## 2024-01-22 ENCOUNTER — Ambulatory Visit
Admission: RE | Admit: 2024-01-22 | Discharge: 2024-01-22 | Disposition: A | Discharge status: Home / Self Care | Source: Ambulatory Visit | Attending: Hematology and Oncology | Admitting: Hematology and Oncology

## 2024-01-22 DIAGNOSIS — C50211 Malignant neoplasm of upper-inner quadrant of right female breast: Secondary | ICD-10-CM

## 2024-01-22 MED ORDER — IOPAMIDOL (ISOVUE-370) INJECTION 76%
100.0000 mL | Freq: Once | INTRAVENOUS | Status: AC | PRN
  Administered 2024-01-22: 100 mL via INTRAVENOUS

## 2024-01-30 ENCOUNTER — Encounter: Payer: Self-pay | Admitting: Hematology and Oncology

## 2024-01-30 NOTE — Progress Notes (Signed)
 Debbie Berry presents to primary care clinic for Dizziness and nausea  Last CPE: 09/14/23  SUBJECTIVE   Dizziness/nausea: Presyncopal episode first occurred three days ago while working outside in the yard and then the next day had similar symptoms while riding in a car. She felt like her heart was racing. No chest pain, dyspnea.  She reports her pulse rate was 130 according to her Fit Bit. The feeling lasted about 5-10 minutes and then resolved on its own. She had a slight headache and nausea for the rest of the day but no further feeling of syncope. Felt her allergies or sinuses were beginning to flare up.  No nasal congestion, no nasal drainage. Also has left eye twitching  Staying well hydrated, eating slightly less than normal due to nausea.   History of Present Illness The patient presents for evaluation of dizziness.  She experienced a sensation of impending faintness while working in her yard, accompanied by an elevated heart rate, nausea, and alternating hot and cold sensations. A similar episode occurred on Sunday during a car ride. These episodes, each lasting approximately 5 to 10 minutes, have left her feeling unwell for the past few days. She also reported a headache that started on Saturday and persists intermittently, along with a pressure-like sensation on her left side. She reports no shortness of breath or chest pain. She recalls a previous episode of rapid heart rate a few years ago, which was discussed with Dr. Sprodans, but has not had any similar incidents since then. She is no longer menstruating and reports no recent illness with vomiting, diarrhea, or nasal congestion. She reports no urinary symptoms. She does not believe she was dehydrated or overheated during the yard work. She reports no vertigo, vision or speech difficulties, balance issues, or leg swelling. She has noticed twitching in her left eye. She also mentioned an unusual sensation in her throat last  night, which she is unsure if it was indigestion.   Allergies[1]  Current Medications[2]  The following portions of the patient's history were reviewed and updated as appropriate: allergies, current medications, past medical history, past social history, past family history, and problem list.   ROS   See HPI.  OBJECTIVE  BP 114/76 (BP Location: Left arm, Patient Position: Sitting)   Pulse 77   Temp 98.3 F (36.8 C) (Oral)   Wt 67.1 kg (148 lb)   SpO2 100%   BMI 25.40 kg/m    GENERAL APPEARANCE: Well appearing, No acute distress.  NECK: No lymphadenopathy, thyromegaly, or carotid bruit noted.. LUNGS: Clear to auscultation bilaterally. HEART: Regular rate and rhythm without murmur.  SKIN:  No rashes or abnormal lesions visible.  EXT: No swelling NEURO: No gross deficit   ASSESSMENT/PLAN   1. Near syncope  Comprehensive Metabolic Panel   CBC with Differential   TSH With Reflex To Free T4   Urinalysis with Microscopic   Magnesium   Comprehensive Metabolic Panel   CBC with Differential   TSH With Reflex To Free T4   Urinalysis with Microscopic   Magnesium    2. Tachycardia  Comprehensive Metabolic Panel   CBC with Differential   TSH With Reflex To Free T4   Urinalysis with Microscopic   Magnesium   Comprehensive Metabolic Panel   CBC with Differential   TSH With Reflex To Free T4   Urinalysis with Microscopic   Magnesium     Assessment & Plan 1. Dizziness. - Episodes of dizziness lasting 5-10 minutes, accompanied by  nausea, hot and cold sensations, and a headache. - Physical examination revealed regular heart rate and no swelling in the legs; no irregular rhythm detected. - Laboratory tests ordered to check electrolytes, anemia, thyroid function, and urinary infection; potential viral infection discussed. - Advised to rest, maintain hydration, and monitor symptoms; if further episodes occur, a heart monitor test and possible cardiology referral will be  considered.     Return for for regular scheduled appointment or as needed..  This document serves as a record of services personally performed by Dannielle Lunger, PA-C.  It was created on their behalf by Merlynn Dolly, RMA, a trained medical scribe, and Registered medical Assistant (RMA). During the course of documenting the history, physical exam and medical decision making, I was functioning as a stage manager. The creation of this record is the provider's dictation and/or activities during the visit.  Electronically signed by Merlynn Dolly, RMA 01/30/2024 2:09 PM    This document was created using the aid of voice recognition Dragon/Dax copilot software.   I agree the documentation is accurate and complete.  Electronically signed by: Dannielle Sherrilyn Lunger, PA-C, 02/04/2024, 5:04 PM       [1] Allergies Allergen Reactions  . Azithromycin Anaphylaxis  . Penicillins Anaphylaxis  . Nsaids (Non-Steroidal Anti-Inflammatory Drug) Swelling  [2]  Current Outpatient Medications:  .  anastrozole  (ARIMIDEX ) 1 mg tablet, Take 1 mg by mouth Once Daily., Disp: , Rfl:  .  calcium carbonate (OS-CAL) 1500 mg (600 mg calcium) tablet, Take  by mouth., Disp: , Rfl:  .  cetirizine (ZyrTEC) 10 mg tablet, Take 10 mg by mouth daily as needed for allergies., Disp: , Rfl:  .  cholecalciferol (VITAMIN D3) 1,000 unit (25 mcg) tablet, Take 1,000 Units by mouth., Disp: , Rfl:  .  collagen, bovine, (Triple Helix Collagen) 100 % powd, Take  by mouth., Disp: , Rfl:  .  cyanocobalamin (VITAMIN B12) 1,000 mcg tablet, Take  by mouth., Disp: , Rfl:  .  omega 3-dha-epa-fish oil (FISH OIL) 1,000 mg cap capsule, Take  by mouth., Disp: , Rfl:

## 2024-01-31 ENCOUNTER — Encounter: Payer: Self-pay | Admitting: Hematology and Oncology

## 2024-04-15 ENCOUNTER — Other Ambulatory Visit: Payer: Self-pay

## 2024-04-15 ENCOUNTER — Other Ambulatory Visit: Payer: Self-pay | Admitting: Gynecologic Oncology

## 2024-04-15 ENCOUNTER — Inpatient Hospital Stay: Attending: Hematology and Oncology

## 2024-04-15 DIAGNOSIS — Z90722 Acquired absence of ovaries, bilateral: Secondary | ICD-10-CM | POA: Diagnosis not present

## 2024-04-15 DIAGNOSIS — E8941 Symptomatic postprocedural ovarian failure: Secondary | ICD-10-CM | POA: Diagnosis not present

## 2024-04-15 DIAGNOSIS — Z90710 Acquired absence of both cervix and uterus: Secondary | ICD-10-CM | POA: Diagnosis not present

## 2024-04-15 DIAGNOSIS — C50211 Malignant neoplasm of upper-inner quadrant of right female breast: Secondary | ICD-10-CM | POA: Diagnosis not present

## 2024-04-15 DIAGNOSIS — Z170 Estrogen receptor positive status [ER+]: Secondary | ICD-10-CM | POA: Diagnosis not present

## 2024-04-15 DIAGNOSIS — Z9079 Acquired absence of other genital organ(s): Secondary | ICD-10-CM | POA: Diagnosis not present

## 2024-04-15 DIAGNOSIS — C562 Malignant neoplasm of left ovary: Secondary | ICD-10-CM

## 2024-04-15 DIAGNOSIS — Z923 Personal history of irradiation: Secondary | ICD-10-CM | POA: Diagnosis not present

## 2024-04-15 DIAGNOSIS — Z8543 Personal history of malignant neoplasm of ovary: Secondary | ICD-10-CM | POA: Insufficient documentation

## 2024-04-15 DIAGNOSIS — Z79811 Long term (current) use of aromatase inhibitors: Secondary | ICD-10-CM | POA: Insufficient documentation

## 2024-04-16 ENCOUNTER — Ambulatory Visit: Payer: Self-pay | Admitting: Gynecologic Oncology

## 2024-04-16 LAB — INHIBIN B: Inhibin B: <7 pg/mL

## 2024-04-17 LAB — INHIBIN A: Inhibin-A: <1 pg/mL

## 2024-04-24 ENCOUNTER — Encounter: Payer: Self-pay | Admitting: Psychiatry

## 2024-04-29 ENCOUNTER — Inpatient Hospital Stay: Attending: Hematology and Oncology | Admitting: Psychiatry

## 2024-04-29 ENCOUNTER — Encounter: Payer: Self-pay | Admitting: Psychiatry

## 2024-04-29 VITALS — BP 122/88 | HR 72 | Temp 98.1°F | Resp 18 | Wt 150.0 lb

## 2024-04-29 DIAGNOSIS — Z9079 Acquired absence of other genital organ(s): Secondary | ICD-10-CM | POA: Insufficient documentation

## 2024-04-29 DIAGNOSIS — N952 Postmenopausal atrophic vaginitis: Secondary | ICD-10-CM | POA: Diagnosis not present

## 2024-04-29 DIAGNOSIS — C562 Malignant neoplasm of left ovary: Secondary | ICD-10-CM

## 2024-04-29 DIAGNOSIS — N941 Unspecified dyspareunia: Secondary | ICD-10-CM | POA: Diagnosis not present

## 2024-04-29 DIAGNOSIS — C50211 Malignant neoplasm of upper-inner quadrant of right female breast: Secondary | ICD-10-CM | POA: Insufficient documentation

## 2024-04-29 DIAGNOSIS — Z90710 Acquired absence of both cervix and uterus: Secondary | ICD-10-CM | POA: Diagnosis not present

## 2024-04-29 DIAGNOSIS — N951 Menopausal and female climacteric states: Secondary | ICD-10-CM | POA: Diagnosis not present

## 2024-04-29 DIAGNOSIS — Z90722 Acquired absence of ovaries, bilateral: Secondary | ICD-10-CM | POA: Insufficient documentation

## 2024-04-29 DIAGNOSIS — Z8543 Personal history of malignant neoplasm of ovary: Secondary | ICD-10-CM | POA: Diagnosis present

## 2024-04-29 DIAGNOSIS — Z923 Personal history of irradiation: Secondary | ICD-10-CM | POA: Insufficient documentation

## 2024-04-29 DIAGNOSIS — Z79811 Long term (current) use of aromatase inhibitors: Secondary | ICD-10-CM | POA: Insufficient documentation

## 2024-04-29 NOTE — Progress Notes (Signed)
 Gynecologic Oncology Return Clinic Visit  Date of Service: 04/29/2024 Referring Provider: Rosaline Chapel, MD  Assessment & Plan: Debbie Berry is a 53 y.o. 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   Granulosa cell tumor: - NED on exam - Inhibin A /B negative from 04/15/24 - Signs/symptoms of recurrence reviewed. - Given that we do not know if inhibin is a marker for her, underwent 1 year follow-up CT 10/20/23. CT is NED. Repeat in 1 year (~10/2024). If next is negative, will likely discontinue scans. - Continue follow-up q57mo through 2 years then yearly.  Genitourinary syndrome of menopause : - Has not had improvement with topical treatments - Given HR+ breast cancer and granulosa cell tumor, topical estrogen is not a great option for her. Also on anastrazole. - Offered referral to menopause clinic through [facility]. Reports that she was never called. Will try to send referral again.   RTC 36mo  Hoy Masters, MD Gynecologic Oncology   Medical Decision Making I personally spent  TOTAL 20 minutes face-to-face and non-face-to-face in the care of this patient, which includes all pre, intra, and post visit time on the date of service.  ----------------------- Reason for Visit: Surveillance  Treatment History: Oncology History  Malignant neoplasm of upper-inner quadrant of right breast in female, estrogen receptor positive (HCC)  12/12/2018 Initial Diagnosis   Screening mammogram detected a distortion in the right breast, measuring 8mm on diagnostic mammogram and US . Biopsy confirmed grade 1 IDC with DCIS, HER-2 negative by FISH, ER 90%, PR 90%, Ki67 10%.    12/19/2018 Cancer Staging   Staging form: Breast, AJCC 8th Edition - Clinical stage from 12/19/2018: Stage IA (cT1b, cN0, cM0, G1, ER+, PR+, HER2-) - Signed by Odean Potts, MD on 12/19/2018   01/02/2019 Genetic Testing   Negative  genetic testing on the [facility] Breast Cancer STAT panel. The STAT Breast cancer panel offered by [facility] includes sequencing and rearrangement analysis for the following 9 genes:  ATM, BRCA1, BRCA2, CDH1, CHEK2, PALB2, PTEN, STK11 and TP53. The report date is 01/02/2019.   01/17/2019 Surgery   Right lumpectomy Osker): IDC with DCIS, 1.2cm, grade 1, clear margins, 2 axillary lymph nodes negative.   01/17/2019 Cancer Staging   Staging form: Breast, AJCC 8th Edition - Pathologic stage from 01/17/2019: Stage IA (pT1c, pN0, cM0, G1, ER+, PR+, HER2-, Oncotype DX score: 24) - Signed by Crawford Morna Pickle, NP on 05/27/2019   01/31/2019 Oncotype testing   Score of 24 with 10% chance of distant recurrence in 9 years without systemic treatment.    02/15/2019 - 03/15/2019 Radiation Therapy   Adjuvant radiation treatment   03/2019 -  Anti-estrogen oral therapy   Anastrozole    03/13/2022 Genetic Testing   Updated testing: Negative genetic testing on [facility] Multi-Cancer +RNA Panel.  Report date is 03/13/2022.   The Multi-Cancer + RNA Panel offered by [facility] includes sequencing and/or deletion/duplication analysis of the following 84 genes:  AIP*, ALK, APC*, ATM*, AXIN2*, BAP1*, BARD1*, BLM*, BMPR1A*, BRCA1*, BRCA2*, BRIP1*, CASR, CDC73*, CDH1*, CDK4, CDKN1B*, CDKN1C*, CDKN2A, CEBPA, CHEK2*, CTNNA1*, DICER1*, DIS3L2*, EGFR, EPCAM, FH*, FLCN*, GATA2*, GPC3, GREM1, HOXB13, HRAS, KIT, MAX*, MEN1*, MET, MITF, MLH1*, MSH2*, MSH3*, MSH6*, MUTYH*, NBN*, NF1*, NF2*, NTHL1*, PALB2*, PDGFRA, PHOX2B, PMS2*, POLD1*, POLE*, POT1*, PRKAR1A*, PTCH1*, PTEN*, RAD50*, RAD51C*, RAD51D*, RB1*, RECQL4, RET, RUNX1*, SDHA*, SDHAF2*, SDHB*, SDHC*, SDHD*, SMAD4*, SMARCA4*, SMARCB1*, SMARCE1*, STK11*, SUFU*, TERC, TERT, TMEM127*, Tp53*, TSC1*, TSC2*,  VHL*, WRN*, and WT1.  RNA analysis is performed for * genes.   Left ovarian granulosa cell tumor  01/26/2022 Surgery   TLH, BSO, cystoscopy for simple endometrial hyperplasia   01/26/2022  Pathology Results      01/26/2022 Initial Diagnosis   Left ovarian granulosa cell tumor   03/08/2022 Tumor Marker   Postop tumor markers: Inhibin B <7.0 Inhibin A <0.3   03/17/2022 Imaging   CT C/A/P: IMPRESSION: 1. No findings to suggest recurrent disease or metastatic disease in the chest, abdomen or pelvis. 2. Incidental findings, as above.     Interval History: Reports that she is overall doing well.  Does continue to have symptoms of atrophy and pain with intercourse.  Tried the Revaree hyaluronic acid suppository which helped some but not much.  Does occasionally have bleeding with intercourse but this is unchanged since surgery.  Otherwise denies any new abdominal/pelvic pain, unintentional weight loss, change in bowel or bladder habits, early satiety, bloating, nausea/vomiting.  Reports that she did not hear anything about referral to [facility].   Past Medical/Surgical History: Past Medical History:  Diagnosis Date   Complication of anesthesia    Patient stated that after her surgery at [facility] in March 2023, she experienced nausae.   Family history of cervical cancer    History of COVID-19 04/2021   per pt mild symptoms that resolved   History of external beam radiation therapy    02-15-2019  to 03-15-2019  right breast cancer   Hyperlipidemia    Malignant neoplasm of upper-inner quadrant of right breast in female, estrogen receptor positive (HCC) 11/2018   oncologist--- dr odean;  dx 06/ 2020,  IDC , Stage IA, ER/ PR +;  completed radiation 03-15-2019   Migraine    PMB (postmenopausal bleeding)    PONV (postoperative nausea and vomiting)    Vertigo    Wears contact lenses     Past Surgical History:  Procedure Laterality Date   BREAST BIOPSY Right 11/2018   BREAST LUMPECTOMY WITH RADIOACTIVE SEED AND SENTINEL LYMPH NODE BIOPSY Right 01/17/2019   Procedure: RIGHT BREAST LUMPECTOMY WITH RADIOACTIVE SEED AND SENTINEL LYMPH NODE BIOPSY;  Surgeon: Curvin Deward MOULD, MD;   Location:  SURGERY CENTER;  Service: General;  Laterality: Right;   CHOLECYSTECTOMY, LAPAROSCOPIC  2013   COLONOSCOPY  02/2020   CYSTOSCOPY N/A 01/26/2022   Procedure: CYSTOSCOPY;  Surgeon: Diedre Rosaline BRAVO, MD;  Location: [facility];  Service: Gynecology;  Laterality: N/A;   DILATATION & CURETTAGE/HYSTEROSCOPY WITH MYOSURE N/A 09/15/2021   Procedure: DILATATION & CURETTAGE/HYSTEROSCOPY;  Surgeon: Diedre Rosaline BRAVO, MD;  Location: [facility];  Service: Gynecology;  Laterality: N/A;   INCISIONAL HERNIA REPAIR  2015   POLYPECTOMY N/A 09/15/2021   Procedure: POLYPECTOMY WITH MYOSURE;  Surgeon: Diedre Rosaline BRAVO, MD;  Location: [facility];  Service: Gynecology;  Laterality: N/A;   TOTAL LAPAROSCOPIC HYSTERECTOMY WITH SALPINGECTOMY N/A 01/26/2022   Procedure: TOTAL LAPAROSCOPIC HYSTERECTOMY WITH SALPINGO-OOPHORECTOMY;  Surgeon: Diedre Rosaline BRAVO, MD;  Location: [facility];  Service: Gynecology;  Laterality: N/A;    Family History  Problem Relation Age of Onset   Colon polyps Mother    Cervical cancer Paternal Grandmother    Cervical cancer Paternal Aunt 63   Colon cancer Neg Hx    Esophageal cancer Neg Hx    Rectal cancer Neg Hx    Stomach cancer Neg Hx     Social History   Socioeconomic History  Marital status: Married    Spouse name: Not on file   Number of children: Not on file   Years of education: Not on file   Highest education level: Not on file  Occupational History   Not on file  Tobacco Use   Smoking status: Former    Current packs/day: 0.00    Types: Cigarettes    Start date: 12/1987    Quit date: 34    Years since quitting: 35.8   Smokeless tobacco: Never  Vaping Use   Vaping status: Never Used  Substance and Sexual Activity   Alcohol use: Yes    Comment: rare   Drug use: Never   Sexual activity: Not on file  Other Topics Concern   Not on file  Social History Narrative   Not  on file   Social Drivers of Health   Financial Resource Strain: Low Risk  (08/30/2021)   Received from [facility] prior to 08/20/2022., [facility]   Overall Financial Resource Strain (CARDIA)    Difficulty of Paying Living Expenses: Not hard at all  Food Insecurity: Low Risk  (01/30/2024)   Received from [facility]   Hunger Vital Sign    Within the past 12 months, you worried that your food would run out before you got money to buy more: Never true    Within the past 12 months, the food you bought just didn't last and you didn't have money to get more. : Never true  Transportation Needs: No Transportation Needs (01/30/2024)   Received from [employer]    In the past 12 months, has lack of reliable transportation kept you from medical appointments, meetings, work or from getting things needed for daily living? : No  Physical Activity: Insufficiently Active (08/30/2021)   Received from [facility] visits prior to 08/20/2022., [facility]   Exercise Vital Sign    On average, how many days per week do you engage in moderate to strenuous exercise (like a brisk walk)?: 2 days    On average, how many minutes do you engage in exercise at this level?: 20 min  Stress: Stress Concern Present (08/30/2021)   Received from [facility] visits prior to 08/20/2022., [facility]   [employer] of Occupational Health - Occupational Stress Questionnaire    Feeling of Stress : To some extent  Social Connections: Moderately Integrated (08/30/2021)   Received from [facility] visits prior to 08/20/2022., [facility]   Social Connection and Isolation Panel    In a typical week, how many times do you talk on the phone with family, friends, or neighbors?: Twice a week    How often do you get together with friends or relatives?: Once a week    How often do you attend church or religious  services?: 1 to 4 times per year    Do you belong to any clubs or organizations such as church groups, unions, fraternal or athletic groups, or school groups?: No    How often do you attend meetings of the clubs or organizations you belong to?: Never    Are you married, widowed, divorced, separated, never married, or living with a partner?: Married    Current Medications:  Current Outpatient Medications:    anastrozole  (ARIMIDEX ) 1 MG tablet, Take 1 tablet (1 mg total) by mouth daily., Disp: 90 tablet, Rfl: 3   Cholecalciferol (VITAMIN D3) 50 MCG (2000  UT) TABS, Take by mouth daily., Disp: , Rfl:    COLLAGEN PO, Take by mouth daily. POWDER, Disp: , Rfl:    Cyanocobalamin (VITAMIN B 12 PO), Take by mouth daily at 12 noon., Disp: , Rfl:    Lidocaine  HCl 5 % GEL, Apply 1 Application topically daily as needed (apply to entrance of the vagina as needed for discomfort)., Disp: 85 g, Rfl: 0   Multiple Vitamins-Minerals (EYE HEALTH AREDS 2 PO), Take by mouth., Disp: , Rfl:    Omega-3 Fatty Acids (FISH OIL) 1200 MG CAPS, Take 1 capsule by mouth daily., Disp: , Rfl:   Review of Symptoms: Complete 10-system review is positive for: Pain with intercourse, vaginal bleeding after intercourse  Physical Exam: BP 122/88 (BP Location: Right Arm, Patient Position: Sitting)   Pulse 72   Temp 98.1 F (36.7 C) (Oral)   Resp 18   Wt 150 lb (68 kg)   LMP 12/19/2018 (Exact Date) Comment: Patient stopped having periods due to taking Arimidex  per pt.  SpO2 99%   BMI 24.58 kg/m  General: Alert, oriented, no acute distress. HEENT: Normocephalic, atraumatic. Neck symmetric without masses. Sclera anicteric.  Chest: Normal work of breathing. Clear to auscultation bilaterally.   Cardiovascular: Regular rate and rhythm, no murmurs. Abdomen: Soft, nontender.  Normoactive bowel sounds.  No masses appreciated.  Well-healed incisions. Extremities: Grossly normal range of motion.  Warm, well perfused.  No edema  bilaterally. Skin: No rashes or lesions noted. Lymphatics: No cervical, supraclavicular, or inguinal adenopathy. GU: Normal appearing external genitalia without erythema, excoriation, or lesions.  Speculum exam reveals normal cuff without lesions. Atrophic vaginal mucosa.  Bimanual exam reveals smooth cuff. No nodularity. No pelvic mass. Exam chaperoned by Kimberly Jordan, CMA    Laboratory & Radiologic Studies: Lab Results  Component Value Date   INHBB <7.0 04/15/2024   INHBB <7.0 10/17/2023   INHBB <7.0 04/06/2023  Inhibin A: <1.0 (04/15/24) Inhibin A: <1.0 (10/17/23) Inhibin A: 0.4 (09/13/22) Inhibin A: <0.3 (03/08/22) Inhibin A: <0.3 (04/06/23)

## 2024-04-29 NOTE — Patient Instructions (Signed)
 It was a pleasure to see you in clinic today. - Plan repeat CT and labs in 6 months - Return visit planned for 6 months. - Will refer you to the [facility] menopause clinic to see if there is anything else they might consider for treatment of your genitourinary syndrome of menopause.  Thank you very much for allowing me to provide care for you today.  I appreciate your confidence in choosing our Gynecologic Oncology team at [facility].  If you have any questions about your visit today please call our office or send us  a MyChart message and we will get back to you as soon as possible.

## 2024-05-02 ENCOUNTER — Inpatient Hospital Stay: Payer: BC Managed Care – PPO | Admitting: Hematology and Oncology

## 2024-05-02 VITALS — BP 116/85 | HR 78 | Temp 98.2°F | Resp 16 | Ht 65.5 in | Wt 148.4 lb

## 2024-05-02 DIAGNOSIS — Z170 Estrogen receptor positive status [ER+]: Secondary | ICD-10-CM

## 2024-05-02 DIAGNOSIS — Z8543 Personal history of malignant neoplasm of ovary: Secondary | ICD-10-CM | POA: Diagnosis not present

## 2024-05-02 DIAGNOSIS — C50211 Malignant neoplasm of upper-inner quadrant of right female breast: Secondary | ICD-10-CM

## 2024-05-02 NOTE — Assessment & Plan Note (Signed)
 12/12/2018:Screening mammogram detected a distortion in the right breast, measuring 8mm on diagnostic mammogram and US . Biopsy confirmed grade 1 IDC with DCIS, HER-2 negative by FISH, ER 90%, PR 90%, Ki67 10%.   01/17/19: Rt Lumpectomy IDC with DCIS, 1.2cm, grade 1, clear margins, 2 axillary lymph nodes negative.HER-2 negative by FISH, ER 90%, PR 90%, Ki67 10%.   Oncotype DX score 24: 10% ROR Adjuvant radiation therapy 02/07/2019-03/15/2011   Current treatment: Antiestrogen therapy with anastrozole  started October 2020.  This was switched to tamoxifen  February 2021 , switched to anastrozole  May 2022     Anastrozole  toxicities: Denies any hot flashes or arthralgias.    Breast cancer surveillance: 1.  Contrast-enhanced mammogram 01/22/2024: Patchy non-mass enhancement lumpectomy bed previously biopsied fat necrosis.  Benign breast density category C Bone density 11/28/2022: T score -1.5 (in 2022 it used to be -1): Mild osteopenia CT abdomen pelvis 10/24/2022: Negative 2. breast MRI 06/16/2023: Indeterminate 7 mm mass left anterior right breast 3.  Right breast biopsy 06/22/2023: Benign   She will undergo contrast-enhanced mammograms annually for surveillance

## 2024-05-02 NOTE — Progress Notes (Signed)
 Patient Care Team: Spry, Powell HERO., MD as PCP - General (Family Medicine) Curvin Deward MOULD, MD as Consulting Physician (General Surgery) Odean Potts, MD as Consulting Physician (Hematology and Oncology) Izell Domino, MD as Attending Physician (Radiation Oncology)  DIAGNOSIS:  Encounter Diagnosis  Name Primary?   Malignant neoplasm of upper-inner quadrant of right breast in female, estrogen receptor positive (HCC) Yes    SUMMARY OF ONCOLOGIC HISTORY: Oncology History  Malignant neoplasm of upper-inner quadrant of right breast in female, estrogen receptor positive (HCC)  12/12/2018 Initial Diagnosis   Screening mammogram detected a distortion in the right breast, measuring 8mm on diagnostic mammogram and US . Biopsy confirmed grade 1 IDC with DCIS, HER-2 negative by FISH, ER 90%, PR 90%, Ki67 10%.    12/19/2018 Cancer Staging   Staging form: Breast, AJCC 8th Edition - Clinical stage from 12/19/2018: Stage IA (cT1b, cN0, cM0, G1, ER+, PR+, HER2-) - Signed by Odean Potts, MD on 12/19/2018   01/02/2019 Genetic Testing   Negative genetic testing on the [facility] Breast Cancer STAT panel. The STAT Breast cancer panel offered by [facility] includes sequencing and rearrangement analysis for the following 9 genes:  ATM, BRCA1, BRCA2, CDH1, CHEK2, PALB2, PTEN, STK11 and TP53. The report date is 01/02/2019.   01/17/2019 Surgery   Right lumpectomy Osker): IDC with DCIS, 1.2cm, grade 1, clear margins, 2 axillary lymph nodes negative.   01/17/2019 Cancer Staging   Staging form: Breast, AJCC 8th Edition - Pathologic stage from 01/17/2019: Stage IA (pT1c, pN0, cM0, G1, ER+, PR+, HER2-, Oncotype DX score: 24) - Signed by Crawford Morna Pickle, NP on 05/27/2019   01/31/2019 Oncotype testing   Score of 24 with 10% chance of distant recurrence in 9 years without systemic treatment.    02/15/2019 - 03/15/2019 Radiation Therapy   Adjuvant radiation treatment   03/2019 -  Anti-estrogen oral therapy    Anastrozole    03/13/2022 Genetic Testing   Updated testing: Negative genetic testing on [facility] Multi-Cancer +RNA Panel.  Report date is 03/13/2022.   The Multi-Cancer + RNA Panel offered by [facility] includes sequencing and/or deletion/duplication analysis of the following 84 genes:  AIP*, ALK, APC*, ATM*, AXIN2*, BAP1*, BARD1*, BLM*, BMPR1A*, BRCA1*, BRCA2*, BRIP1*, CASR, CDC73*, CDH1*, CDK4, CDKN1B*, CDKN1C*, CDKN2A, CEBPA, CHEK2*, CTNNA1*, DICER1*, DIS3L2*, EGFR, EPCAM, FH*, FLCN*, GATA2*, GPC3, GREM1, HOXB13, HRAS, KIT, MAX*, MEN1*, MET, MITF, MLH1*, MSH2*, MSH3*, MSH6*, MUTYH*, NBN*, NF1*, NF2*, NTHL1*, PALB2*, PDGFRA, PHOX2B, PMS2*, POLD1*, POLE*, POT1*, PRKAR1A*, PTCH1*, PTEN*, RAD50*, RAD51C*, RAD51D*, RB1*, RECQL4, RET, RUNX1*, SDHA*, SDHAF2*, SDHB*, SDHC*, SDHD*, SMAD4*, SMARCA4*, SMARCB1*, SMARCE1*, STK11*, SUFU*, TERC, TERT, TMEM127*, Tp53*, TSC1*, TSC2*, VHL*, WRN*, and WT1.  RNA analysis is performed for * genes.   Left ovarian granulosa cell tumor  01/26/2022 Surgery   TLH, BSO, cystoscopy for simple endometrial hyperplasia   01/26/2022 Pathology Results      01/26/2022 Initial Diagnosis   Left ovarian granulosa cell tumor   03/08/2022 Tumor Marker   Postop tumor markers: Inhibin B <7.0 Inhibin A <0.3   03/17/2022 Imaging   CT C/A/P: IMPRESSION: 1. No findings to suggest recurrent disease or metastatic disease in the chest, abdomen or pelvis. 2. Incidental findings, as above.     CHIEF COMPLIANT: Surveillance of breast cancer  HISTORY OF PRESENT ILLNESS:   History of Present Illness Debbie Berry is a 53 year old female with a history of breast cancer who presents for a routine follow-up.  She underwent a contrast mammogram in August and has had two previous  biopsies related to the surgery site. She has been on anastrozole  since 2020 and tolerates it well without any side effects. She maintains an active lifestyle with regular exercise and gardening. She notes a slight weight  loss, attributed to dietary changes. She inquires about the continuation of annual contrast mammograms and regular blood tests as part of her follow-up care.     ALLERGIES:  is allergic to azithromycin, penicillins, and nsaids.  MEDICATIONS:  Current Outpatient Medications  Medication Sig Dispense Refill   anastrozole  (ARIMIDEX ) 1 MG tablet Take 1 tablet (1 mg total) by mouth daily. 90 tablet 3   Cholecalciferol (VITAMIN D3) 50 MCG (2000 UT) TABS Take by mouth daily.     COLLAGEN PO Take by mouth daily. POWDER     Cyanocobalamin (VITAMIN B 12 PO) Take by mouth daily at 12 noon.     Lidocaine  HCl 5 % GEL Apply 1 Application topically daily as needed (apply to entrance of the vagina as needed for discomfort). 85 g 0   Multiple Vitamins-Minerals (EYE HEALTH AREDS 2 PO) Take by mouth.     Omega-3 Fatty Acids (FISH OIL) 1200 MG CAPS Take 1 capsule by mouth daily.     No current facility-administered medications for this visit.    PHYSICAL EXAMINATION: ECOG PERFORMANCE STATUS: 1 - Symptomatic but completely ambulatory  Vitals:   05/02/24 0900  BP: 116/85  Pulse: 78  Resp: 16  Temp: 98.2 F (36.8 C)  SpO2: 99%   Filed Weights   05/02/24 0900  Weight: 148 lb 6.4 oz (67.3 kg)    Physical Exam MEASUREMENTS: Weight- 150.  (exam performed in the presence of a chaperone)  LABORATORY DATA:  I have reviewed the data as listed    Latest Ref Rng & Units 01/24/2022    2:18 PM 12/19/2018   12:19 PM  CMP  Glucose 70 - 99 mg/dL 85  93   BUN 6 - 20 mg/dL 17  10   Creatinine 9.55 - 1.00 mg/dL 9.31  9.19   Sodium 864 - 145 mmol/L 139  139   Potassium 3.5 - 5.1 mmol/L 4.3  3.6   Chloride 98 - 111 mmol/L 108  106   CO2 22 - 32 mmol/L 25  23   Calcium 8.9 - 10.3 mg/dL 9.5  8.9   Total Protein 6.5 - 8.1 g/dL  7.3   Total Bilirubin 0.3 - 1.2 mg/dL  0.4   Alkaline Phos 38 - 126 U/L  34   AST 15 - 41 U/L  20   ALT 0 - 44 U/L  32     Lab Results  Component Value Date   WBC 4.9 01/24/2022    HGB 14.6 01/24/2022   HCT 43.3 01/24/2022   MCV 87.5 01/24/2022   PLT 225 01/24/2022   NEUTROABS 2.8 01/24/2022    ASSESSMENT & PLAN:  Malignant neoplasm of upper-inner quadrant of right breast in female, estrogen receptor positive (HCC) 12/12/2018:Screening mammogram detected a distortion in the right breast, measuring 8mm on diagnostic mammogram and US . Biopsy confirmed grade 1 IDC with DCIS, HER-2 negative by FISH, ER 90%, PR 90%, Ki67 10%.   01/17/19: Rt Lumpectomy IDC with DCIS, 1.2cm, grade 1, clear margins, 2 axillary lymph nodes negative.HER-2 negative by FISH, ER 90%, PR 90%, Ki67 10%.   Oncotype DX score 24: 10% ROR Adjuvant radiation therapy 02/07/2019-03/15/2011   Current treatment: Antiestrogen therapy with anastrozole  started October 2020.  This was switched to tamoxifen  February 2021 , switched  to anastrozole  May 2022     Anastrozole  toxicities: Denies any hot flashes or arthralgias.  Recommended anastrozole  for 7 years   Breast cancer surveillance: 1.  Contrast-enhanced mammogram 01/22/2024: Patchy non-mass enhancement lumpectomy bed previously biopsied fat necrosis.  Benign breast density category C Bone density 11/28/2022: T score -1.5 (in 2022 it used to be -1): Mild osteopenia CT abdomen pelvis 10/24/2022: Negative 2. breast MRI 06/16/2023: Indeterminate 7 mm mass left anterior right breast 3.  Right breast biopsy 06/22/2023: Benign 4.  Guardant reveal for MRD testing: August 2025: 0%   She will undergo contrast-enhanced mammograms annually for surveillance  Return to clinic in 1 year for follow-up   No orders of the defined types were placed in this encounter.  The patient has a good understanding of the overall plan. she agrees with it. she will call with any problems that may develop before the next visit here.  I personally spent a total of 30 minutes in the care of the patient today including preparing to see the patient, getting/reviewing separately obtained  history, performing a medically appropriate exam/evaluation, counseling and educating, placing orders, referring and communicating with other health care professionals, documenting clinical information in the EHR, independently interpreting results, communicating results, and coordinating care.   Viinay K Najia Hurlbutt, MD 05/02/24

## 2024-05-08 ENCOUNTER — Telehealth: Payer: Self-pay | Admitting: *Deleted

## 2024-05-08 NOTE — Telephone Encounter (Signed)
 Per Dr Ethyl fax referral and records to Dr Azalee at [facility] menopause clinic

## 2024-05-15 NOTE — Telephone Encounter (Signed)
 Per menopause clinic request refax records

## 2024-05-21 NOTE — Telephone Encounter (Signed)
 Patient scheduled for a new patient appt with [facility] menopause clinic on 08/19/2024

## 2024-10-23 ENCOUNTER — Other Ambulatory Visit

## 2024-10-28 ENCOUNTER — Inpatient Hospital Stay: Attending: Hematology and Oncology | Admitting: Psychiatry

## 2025-05-01 ENCOUNTER — Inpatient Hospital Stay: Admitting: Hematology and Oncology
